# Patient Record
Sex: Male | Born: 1970 | Race: White | Hispanic: No | Marital: Married | Smoking: Former smoker
Health system: Southern US, Community
[De-identification: ages and names within clinical notes are randomized; demographics above are authoritative.]

## PROBLEM LIST (undated history)

## (undated) DIAGNOSIS — M795 Residual foreign body in soft tissue: Secondary | ICD-10-CM

## (undated) DIAGNOSIS — T7840XA Allergy, unspecified, initial encounter: Secondary | ICD-10-CM

## (undated) DIAGNOSIS — Z8619 Personal history of other infectious and parasitic diseases: Secondary | ICD-10-CM

## (undated) DIAGNOSIS — G4733 Obstructive sleep apnea (adult) (pediatric): Principal | ICD-10-CM

## (undated) DIAGNOSIS — I10 Essential (primary) hypertension: Secondary | ICD-10-CM

## (undated) DIAGNOSIS — E785 Hyperlipidemia, unspecified: Secondary | ICD-10-CM

## (undated) DIAGNOSIS — K219 Gastro-esophageal reflux disease without esophagitis: Secondary | ICD-10-CM

## (undated) HISTORY — DX: Essential (primary) hypertension: I10

## (undated) HISTORY — DX: Personal history of other infectious and parasitic diseases: Z86.19

## (undated) HISTORY — DX: Allergy, unspecified, initial encounter: T78.40XA

## (undated) HISTORY — DX: Obstructive sleep apnea (adult) (pediatric): G47.33

## (undated) HISTORY — PX: CERVICAL FUSION: SHX112

## (undated) HISTORY — DX: Hyperlipidemia, unspecified: E78.5

---

## 2007-07-16 HISTORY — PX: IRRIGATION AND DEBRIDEMENT SEBACEOUS CYST: SHX5255

## 2011-07-04 ENCOUNTER — Ambulatory Visit: Payer: Self-pay

## 2012-01-24 ENCOUNTER — Ambulatory Visit (INDEPENDENT_AMBULATORY_CARE_PROVIDER_SITE_OTHER): Payer: 59 | Admitting: Internal Medicine

## 2012-01-24 ENCOUNTER — Encounter: Payer: Self-pay | Admitting: Internal Medicine

## 2012-01-24 VITALS — BP 153/93 | HR 73 | Temp 98.1°F | Resp 16 | Ht 70.0 in | Wt 305.0 lb

## 2012-01-24 DIAGNOSIS — I10 Essential (primary) hypertension: Secondary | ICD-10-CM

## 2012-01-24 DIAGNOSIS — R0609 Other forms of dyspnea: Secondary | ICD-10-CM

## 2012-01-24 DIAGNOSIS — F17200 Nicotine dependence, unspecified, uncomplicated: Secondary | ICD-10-CM

## 2012-01-24 DIAGNOSIS — E669 Obesity, unspecified: Secondary | ICD-10-CM

## 2012-01-24 DIAGNOSIS — Z720 Tobacco use: Secondary | ICD-10-CM

## 2012-01-24 DIAGNOSIS — R0683 Snoring: Secondary | ICD-10-CM

## 2012-01-24 MED ORDER — ATENOLOL 50 MG PO TABS: 50.0000 mg | ORAL_TABLET | Freq: Every day | ORAL | Status: DC

## 2012-01-24 NOTE — Patient Instructions (Signed)
Please schedule fasting labs for next Weds Cbc, TSH-401.9, chem7-v58.69 and lipid-chol screening

## 2012-01-26 ENCOUNTER — Encounter: Payer: Self-pay | Admitting: Internal Medicine

## 2012-01-26 DIAGNOSIS — IMO0002 Reserved for concepts with insufficient information to code with codable children: Secondary | ICD-10-CM | POA: Insufficient documentation

## 2012-01-26 DIAGNOSIS — Z720 Tobacco use: Secondary | ICD-10-CM | POA: Insufficient documentation

## 2012-01-26 DIAGNOSIS — I10 Essential (primary) hypertension: Secondary | ICD-10-CM | POA: Insufficient documentation

## 2012-01-26 DIAGNOSIS — E669 Obesity, unspecified: Secondary | ICD-10-CM | POA: Insufficient documentation

## 2012-01-26 DIAGNOSIS — G4733 Obstructive sleep apnea (adult) (pediatric): Secondary | ICD-10-CM | POA: Insufficient documentation

## 2012-01-26 HISTORY — DX: Obstructive sleep apnea (adult) (pediatric): G47.33

## 2012-01-26 NOTE — Assessment & Plan Note (Signed)
Discussed and recommended cessation. States understanding.

## 2012-01-26 NOTE — Assessment & Plan Note (Signed)
Encouraged dietary modification and wt loss. Obtain chem7, tsh and lipid

## 2012-01-26 NOTE — Assessment & Plan Note (Signed)
Suspect possible osa. Discussed sleep study and pt states will consider. Recommend wt loss

## 2012-01-26 NOTE — Assessment & Plan Note (Signed)
Isolated elevation off medication. Resume atenolol. Monitor bp as outpt and f/u in clinic as scheduled. Obtain cbc

## 2012-01-26 NOTE — Progress Notes (Signed)
  Subjective:    Patient ID: Micheal Mckinney, male    DOB: 1971-04-16, 41 y.o.   MRN: [id number]  HPI Pt presents to clinic for follow up of HTN. On stable dose of beta blocker without side effects. BP typically normotensive but has been out of medication for three days. Is smoking 1/2 ppd down from previous. Has been successful at cessation in the past. Does snore and his wife may believe he has witnessed apnea. No known h/o osa and no past sleep study. No other complaints.  Past Medical History  Diagnosis Date  . History of chicken pox   . Allergy     hay fever  . Hypertension    Past Surgical History  Procedure Date  . Irrigation and debridement sebaceous cyst 2009     on back and neck    reports that he has been smoking.  He does not have any smokeless tobacco history on file. He reports that he drinks alcohol. His drug history not on file. family history includes Cancer in his mother. No Known Allergies   Review of Systems  Constitutional: Negative for fatigue.  Respiratory: Positive for apnea. Negative for shortness of breath.   Cardiovascular: Negative for chest pain.  Neurological: Negative for dizziness.  All other systems reviewed and are negative.       Objective:   Physical Exam  Nursing note and vitals reviewed. Constitutional: He appears well-developed and well-nourished. No distress.  HENT:  Head: Normocephalic and atraumatic.  Right Ear: External ear normal.  Left Ear: External ear normal.  Nose: Nose normal.  Mouth/Throat: Oropharynx is clear and moist. No oropharyngeal exudate.  Eyes: Conjunctivae and EOM are normal. Pupils are equal, round, and reactive to light. No scleral icterus.  Neck: Neck supple. No thyromegaly present.  Cardiovascular: Normal rate, regular rhythm and normal heart sounds.  Exam reveals no gallop and no friction rub.   No murmur heard. Pulmonary/Chest: Effort normal and breath sounds normal. No respiratory distress. He has no  wheezes. He has no rales.  Lymphadenopathy:    He has no cervical adenopathy.  Neurological: He is alert.  Skin: Skin is warm and dry. No rash noted. He is not diaphoretic.  Psychiatric: He has a normal mood and affect. His behavior is normal.          Assessment & Plan:

## 2012-07-31 ENCOUNTER — Ambulatory Visit: Payer: 59 | Admitting: Internal Medicine

## 2013-01-12 ENCOUNTER — Encounter: Payer: Self-pay | Admitting: Family

## 2013-01-12 ENCOUNTER — Ambulatory Visit (INDEPENDENT_AMBULATORY_CARE_PROVIDER_SITE_OTHER): Payer: 59 | Admitting: Family

## 2013-01-12 VITALS — BP 130/90 | HR 70 | Temp 97.9°F | Resp 16 | Ht 70.0 in | Wt 326.1 lb

## 2013-01-12 DIAGNOSIS — Z720 Tobacco use: Secondary | ICD-10-CM

## 2013-01-12 DIAGNOSIS — G5600 Carpal tunnel syndrome, unspecified upper limb: Secondary | ICD-10-CM

## 2013-01-12 DIAGNOSIS — R400 Somnolence: Secondary | ICD-10-CM

## 2013-01-12 DIAGNOSIS — R0683 Snoring: Secondary | ICD-10-CM

## 2013-01-12 DIAGNOSIS — I10 Essential (primary) hypertension: Secondary | ICD-10-CM

## 2013-01-12 DIAGNOSIS — R404 Transient alteration of awareness: Secondary | ICD-10-CM

## 2013-01-12 DIAGNOSIS — F17200 Nicotine dependence, unspecified, uncomplicated: Secondary | ICD-10-CM

## 2013-01-12 DIAGNOSIS — R0609 Other forms of dyspnea: Secondary | ICD-10-CM

## 2013-01-12 LAB — BASIC METABOLIC PANEL
Calcium: 10.2 mg/dL (ref 8.4–10.5)
Potassium: 4.5 mEq/L (ref 3.5–5.3)
Sodium: 139 mEq/L (ref 135–145)

## 2013-01-12 MED ORDER — ATENOLOL 50 MG PO TABS: 50.0000 mg | ORAL_TABLET | Freq: Every day | ORAL | Status: DC

## 2013-01-12 MED ORDER — MELOXICAM 7.5 MG PO TABS: 7.5000 mg | ORAL_TABLET | Freq: Every day | ORAL | Status: DC

## 2013-01-12 NOTE — Assessment & Plan Note (Signed)
Suspect OSA.  Will refer for split night study.  Pt is agreeable.

## 2013-01-12 NOTE — Progress Notes (Signed)
  Subjective:    Patient ID: Micheal Mckinney, male    DOB: 1971/02/15, 42 y.o.   MRN: [id number]  HPI  [name] is a 42 year old male who presents today for follow up.  1) HTN- denies CP/SOB  2) Hand pain- pt works as a Nutritional therapist.  Reports tingling, numbness, pain.  He has tried icing/heat, wears wrist splints at night which helps.   3) Snoring-  Continues to be a problem, reports daytime somnolence.    4) Tobacco abuse- quit 11 months ago after switching to E- cig.   Review of Systems See HPI  Past Medical History  Diagnosis Date  . History of chicken pox   . Allergy     hay fever  . Hypertension     History   Social History  . Marital Status: Divorced    Spouse Name: N/A    Number of Children: N/A  . Years of Education: N/A   Occupational History  . Not on file.   Social History Main Topics  . Smoking status: Former Smoker    Quit date: 02/13/2012  . Smokeless tobacco: Not on file  . Alcohol Use: Yes  . Drug Use: Not on file  . Sexually Active: Yes   Other Topics Concern  . Not on file   Social History Narrative  . No narrative on file    Past Surgical History  Procedure Laterality Date  . Irrigation and debridement sebaceous cyst  2009     on back and neck    Family History  Problem Relation Age of Onset  . Cancer Mother     breast    No Known Allergies  Current Outpatient Prescriptions on File Prior to Visit  Medication Sig Dispense Refill  . aspirin 81 MG tablet Take 81 mg by mouth daily.       No current facility-administered medications on file prior to visit.    BP 130/90  Pulse 70  Temp(Src) 97.9 F (36.6 C) (Oral)  Resp 16  Ht 5\' 10"  (1.778 m)  Wt 326 lb 1.9 oz (147.927 kg)  BMI 46.79 kg/m2  SpO2 98%       Objective:   Physical Exam  Constitutional: He is oriented to person, place, and time. He appears well-developed and well-nourished. No distress.  Cardiovascular: Normal rate and regular rhythm.   No murmur  heard. Pulmonary/Chest: Effort normal and breath sounds normal. No respiratory distress. He has no wheezes. He has no rales. He exhibits no tenderness.  Musculoskeletal: He exhibits no edema.  Neurological: He is alert and oriented to person, place, and time.  Psychiatric: He has a normal mood and affect. His behavior is normal. Judgment and thought content normal.          Assessment & Plan:

## 2013-01-12 NOTE — Assessment & Plan Note (Signed)
I commended him for quitting!

## 2013-01-12 NOTE — Patient Instructions (Addendum)
Please complete your lab work prior to leaving. Congratulations on quitting smoking! You will be contacted about your referral to the hand specialist and about your sleep study. Follow up in 3 months for a fasting physical.

## 2013-01-12 NOTE — Assessment & Plan Note (Signed)
BP Readings from Last 3 Encounters:  01/12/13 130/90  01/24/12 153/93   BP looks good today.  Continue atenolol. Obtain bmet.

## 2013-01-12 NOTE — Assessment & Plan Note (Signed)
Symptoms most consistent with CTS.  Has been working on conservative measures at home with little improvement.  Add short course of meloxicam and will refer to Dr. Amanda Pea for further evaluation.

## 2013-02-07 ENCOUNTER — Encounter (HOSPITAL_BASED_OUTPATIENT_CLINIC_OR_DEPARTMENT_OTHER): Payer: 59

## 2013-03-05 ENCOUNTER — Ambulatory Visit (HOSPITAL_BASED_OUTPATIENT_CLINIC_OR_DEPARTMENT_OTHER): Payer: 59 | Attending: Family | Admitting: Radiology

## 2013-03-05 VITALS — Ht 70.0 in | Wt 300.0 lb

## 2013-03-05 DIAGNOSIS — G4733 Obstructive sleep apnea (adult) (pediatric): Secondary | ICD-10-CM | POA: Insufficient documentation

## 2013-03-05 DIAGNOSIS — R400 Somnolence: Secondary | ICD-10-CM

## 2013-03-17 ENCOUNTER — Encounter: Payer: Self-pay | Admitting: Family

## 2013-03-17 ENCOUNTER — Telehealth: Payer: Self-pay | Admitting: Family

## 2013-03-17 DIAGNOSIS — G4710 Hypersomnia, unspecified: Secondary | ICD-10-CM

## 2013-03-17 DIAGNOSIS — G4730 Sleep apnea, unspecified: Secondary | ICD-10-CM

## 2013-03-17 DIAGNOSIS — G4733 Obstructive sleep apnea (adult) (pediatric): Secondary | ICD-10-CM

## 2013-03-17 NOTE — Telephone Encounter (Signed)
Please call pt and let him know that his sleep study showed severe sleep apnea.  I would like to schedule him for a CPAP titration.

## 2013-03-17 NOTE — Procedures (Signed)
Micheal Mckinney, OMALLEY NO.:  [id number]  MEDICAL RECORD NO.:  [id number]          PATIENT TYPE:  OUT  LOCATION:  SLEEP CENTER                 [facility]ACILITY[facility]  PHYSICIAN:  Barbaraann Share, MD,FCCPDATE OF BIRTH:  1971-04-[birth year]  DATE OF STUDY:  03/05/2013                           NOCTURNAL POLYSOMNOGRAM  REFERRING PHYSICIAN:  Sandford Craze, NP  INDICATION FOR STUDY:  Hypersomnia with sleep apnea.  EPWORTH SLEEPINESS SCORE:  16.  MEDICATIONS:  SLEEP ARCHITECTURE:  The patient had a total sleep time of 348 minutes with no slow-wave sleep and decreased quantity of REM.  Sleep onset latency was normal at 17 minutes and REM onset was normal at 94 minutes. Sleep efficiency was 69% during the diagnostic portion of the study, and 90% during the titration portion.  RESPIRATORY DATA:  The patient underwent a split-night protocol, where he was found to have 238 obstructive and central events in the first 125 minutes of sleep.  This gave him an apnea-hypopnea index of 114 events per hour during the diagnostic portion of the study.  The events occurred in all body positions, and there was loud snoring noted throughout.  By protocol, the patient was then fitted with a standard ResMed AirFit N10 nasal CPAP mask, and CPAP titration was started.  He was titrated as high as 14 cm of water pressure, but continued to have some breakthrough events.  Unfortunately, there was not adequate time for further titration.  OXYGEN DATA:  There was O2 desaturation as low as 74% with the patient's obstructive events.  CARDIAC DATA:  Occasional PAC noted, but no clinically significant arrhythmias were seen.  MOVEMENT-PARASOMNIA:  The patient had no significant leg jerks or other abnormal behaviors noted.  IMPRESSIONS-RECOMMENDATIONS: 1. Split-night study reveals very severe obstructive sleep     apnea/hypopnea syndrome, with an AHI of 114 events per hour and     oxygen desaturation  as low as 74% during the diagnostic portion of     the study.  The patient was then fitted with a standard ResMed     AirFit N10 nasal CPAP mask, however, there was not adequate time to     achieve optimal pressure.  This will need to be done with an     automatic CPAP device at home, or the patient     can return for a formal CPAP titration.  He should also be     encouraged to work aggressively on weight loss. 2. Occasional PAC noted, but no clinically significant arrhythmias     were seen.     Barbaraann Share, MD,FCCP Diplomate, American Board of Sleep Medicine    [facility]/MEDQ  D:  03/17/2013 08:31:36  T:  03/17/2013 10:35:31  Job:  [id number]

## 2013-03-18 NOTE — Telephone Encounter (Signed)
Left detailed message on pt's cell# and to call and let us know if he wants to proceed.

## 2013-03-23 NOTE — Telephone Encounter (Signed)
Discussed results with pt and he is agreeable to proceed with CPAP titration.

## 2013-05-04 HISTORY — PX: CARPAL TUNNEL RELEASE: SHX101

## 2013-05-07 ENCOUNTER — Encounter: Payer: Self-pay | Admitting: Family

## 2013-05-07 ENCOUNTER — Ambulatory Visit (INDEPENDENT_AMBULATORY_CARE_PROVIDER_SITE_OTHER): Payer: 59 | Admitting: Family

## 2013-05-07 ENCOUNTER — Ambulatory Visit (HOSPITAL_BASED_OUTPATIENT_CLINIC_OR_DEPARTMENT_OTHER): Payer: 59 | Attending: Family

## 2013-05-07 VITALS — BP 126/80 | HR 66 | Temp 98.6°F | Resp 16 | Ht 70.0 in | Wt 326.1 lb

## 2013-05-07 DIAGNOSIS — G4733 Obstructive sleep apnea (adult) (pediatric): Secondary | ICD-10-CM | POA: Insufficient documentation

## 2013-05-07 DIAGNOSIS — Z23 Encounter for immunization: Secondary | ICD-10-CM

## 2013-05-07 DIAGNOSIS — Z0000 Encounter for general adult medical examination without abnormal findings: Secondary | ICD-10-CM

## 2013-05-07 LAB — CBC WITH DIFFERENTIAL/PLATELET
Basophils Absolute: 0 10*3/uL (ref 0.0–0.1)
Basophils Relative: 0 % (ref 0–1)
HCT: 43.5 % (ref 39.0–52.0)
Hemoglobin: 15.3 g/dL (ref 13.0–17.0)
Lymphs Abs: 3.4 10*3/uL (ref 0.7–4.0)
MCH: 30 pg (ref 26.0–34.0)
MCHC: 35.2 g/dL (ref 30.0–36.0)
Monocytes Absolute: 0.6 10*3/uL (ref 0.1–1.0)
Neutro Abs: 4 10*3/uL (ref 1.7–7.7)
RDW: 13.9 % (ref 11.5–15.5)

## 2013-05-07 LAB — BASIC METABOLIC PANEL WITH GFR
Calcium: 10.1 mg/dL (ref 8.4–10.5)
Chloride: 104 mEq/L (ref 96–112)
GFR, Est African American: >89 mL/min
GFR, Est Non African American: >89 mL/min
Potassium: 4.3 mEq/L (ref 3.5–5.3)

## 2013-05-07 LAB — HEPATIC FUNCTION PANEL
Alkaline Phosphatase: 61 U/L (ref 39–117)
Bilirubin, Direct: 0.1 mg/dL (ref 0.0–0.3)
Indirect Bilirubin: 0.4 mg/dL (ref 0.0–0.9)
Total Bilirubin: 0.5 mg/dL (ref 0.3–1.2)
Total Protein: 6.6 g/dL (ref 6.0–8.3)

## 2013-05-07 LAB — TSH: TSH: 2.275 u[IU]/mL (ref 0.350–4.500)

## 2013-05-07 LAB — LIPID PANEL
Cholesterol: 227 mg/dL — ABNORMAL HIGH (ref 0–200)
HDL: 29 mg/dL — ABNORMAL LOW (ref >39)
LDL Cholesterol: 156 mg/dL — ABNORMAL HIGH (ref 0–99)
Total CHOL/HDL Ratio: 7.8 Ratio
Triglycerides: 211 mg/dL — ABNORMAL HIGH (ref <150)

## 2013-05-07 NOTE — Assessment & Plan Note (Signed)
Discussed importance of healthy diet, exercise, weight loss.  Plan follow up in 6 months.  Goal weight loss 1 pound a week.  Obtain fasting lab work.   EKG today.

## 2013-05-07 NOTE — Progress Notes (Signed)
Subjective:    Patient ID: Micheal Mckinney, male    DOB: 09/30/1970, 42 y.o.   MRN: [id number]  HPI  Patient presents today for complete physical.  Immunizations: tetanus up to date. Flu shot today.   Diet: reports his diet is terrible.  Reports weight problems his who life.  Reports at one time he was down to 200 pounds.  Eats too many carbs.   Exercise: plans to start exercise regimen.    Reports carpal tunnel surgery Dr.  Brown Human 2 days ago.  Reports some pain post op.   Got married in [place] 2 weeks ago.      Review of Systems  Constitutional: Negative for unexpected weight change.  Respiratory: Negative for cough and shortness of breath.        Mild nasal congestion  Cardiovascular: Negative for chest pain.  Gastrointestinal: Negative for vomiting, constipation and blood in stool.  Genitourinary: Negative for dysuria, frequency and hematuria.  Musculoskeletal: Negative for arthralgias and myalgias.  Skin: Negative for rash.  Neurological:       Reports no recent HA's.   Hematological: Negative for adenopathy.  Psychiatric/Behavioral:       Denies depression/anxiety   Past Medical History  Diagnosis Date  . History of chicken pox   . Allergy     hay fever  . Hypertension   . OSA (obstructive sleep apnea) 01/26/2012    History   Social History  . Marital Status: Divorced    Spouse Name: N/A    Number of Children: N/A  . Years of Education: N/A   Occupational History  . Not on file.   Social History Main Topics  . Smoking status: Former Smoker    Quit date: 02/13/2012  . Smokeless tobacco: Not on file  . Alcohol Use: Yes  . Drug Use: Not on file  . Sexual Activity: Yes   Other Topics Concern  . Not on file   Social History Narrative  . No narrative on file    Past Surgical History  Procedure Laterality Date  . Irrigation and debridement sebaceous cyst  2009     on back and neck  . Carpal tunnel release Right 05/04/13    Family History   Problem Relation Age of Onset  . Cancer Mother     breast    No Known Allergies  Current Outpatient Prescriptions on File Prior to Visit  Medication Sig Dispense Refill  . aspirin 81 MG tablet Take 81 mg by mouth daily.      Marland Kitchen atenolol (TENORMIN) 50 MG tablet Take 1 tablet (50 mg total) by mouth daily.  90 tablet  1  . meloxicam (MOBIC) 7.5 MG tablet Take 1 tablet (7.5 mg total) by mouth daily.  14 tablet  0   No current facility-administered medications on file prior to visit.    BP 126/80  Pulse 66  Temp(Src) 98.6 F (37 C) (Oral)  Resp 16  Ht 5\' 10"  (1.778 m)  Wt 326 lb 1.3 oz (147.909 kg)  BMI 46.79 kg/m2  SpO2 99%        Objective:   Physical Exam  Physical Exam  Constitutional:morbidly obese male. He is oriented to person, place, and time. He appears well-developed and well-nourished. No distress.  HENT:  Head: Normocephalic and atraumatic.  Right Ear: Tympanic membrane and ear canal normal.  Left Ear: Tympanic membrane and ear canal normal.  Mouth/Throat: Oropharynx is clear and moist.  Eyes: Pupils are equal, round, and reactive  to light. No scleral icterus.  Neck: Normal range of motion. No thyromegaly present.  Cardiovascular: Normal rate and regular rhythm.   No murmur heard. Pulmonary/Chest: Effort normal and breath sounds normal. No respiratory distress. He has no wheezes. He has no rales. He exhibits no tenderness.  Abdominal: Soft. Bowel sounds are normal. He exhibits no distension and no mass. There is no tenderness. There is no rebound and no guarding.  Musculoskeletal: He exhibits no edema.  Lymphadenopathy:    He has no cervical adenopathy.  Neurological: He is alert and oriented to person, place, and time. He has normal reflexes. He exhibits normal muscle tone. Coordination normal.  Skin: Skin is warm and dry.  Psychiatric: He has a normal mood and affect. His behavior is normal. Judgment and thought content normal.          Assessment &  Plan:         Assessment & Plan:

## 2013-05-07 NOTE — Patient Instructions (Signed)
Please complete lab work prior to leaving. Work hard on [employer], exercise, and weight loss. Follow up in 6 months.

## 2013-05-08 LAB — URINALYSIS, MICROSCOPIC ONLY
Bacteria, UA: NONE SEEN
Crystals: NONE SEEN
Squamous Epithelial / LPF: NONE SEEN

## 2013-05-08 LAB — URINALYSIS, ROUTINE W REFLEX MICROSCOPIC
Bilirubin Urine: NEGATIVE
Ketones, ur: NEGATIVE mg/dL
Leukocytes, UA: NEGATIVE
Nitrite: NEGATIVE
Protein, ur: NEGATIVE mg/dL
Urobilinogen, UA: 0.2 mg/dL (ref 0.0–1.0)

## 2013-05-10 ENCOUNTER — Encounter: Payer: Self-pay | Admitting: Family

## 2013-05-10 DIAGNOSIS — E785 Hyperlipidemia, unspecified: Secondary | ICD-10-CM | POA: Insufficient documentation

## 2013-05-24 DIAGNOSIS — G4730 Sleep apnea, unspecified: Secondary | ICD-10-CM

## 2013-05-24 DIAGNOSIS — G4710 Hypersomnia, unspecified: Secondary | ICD-10-CM

## 2013-05-24 DIAGNOSIS — G4733 Obstructive sleep apnea (adult) (pediatric): Secondary | ICD-10-CM

## 2013-05-25 NOTE — Procedures (Signed)
Micheal Mckinney, SENSING NO.:  [id number]  MEDICAL RECORD NO.:  [id number]          PATIENT TYPE:  OUT  LOCATION:  SLEEP CENTER                 FACILITY:  [facility]  PHYSICIAN:  Oretha Milch, MD      DATE OF BIRTH:  11-10-1970  DATE OF STUDY:  05/07/2013                           NOCTURNAL POLYSOMNOGRAM  REFERRING PHYSICIAN:  Sandford Craze, NP  INDICATION FOR STUDY:  Micheal Mckinney is a 42 year old obese man with severe obstructive sleep apnea.  His split night study on March 05, 2013, showed severe obstructive sleep apnea with AHI of 114 events per hour with nadir oxygen desaturation of  74%.  This was partially corrected by CPAP of 14 cm, but some breakthrough events were noted. Hence, a repeat CPAP titration study was ordered.  This CPAP titration study was performed with a sleep technologist in attendance.  EEG, EOG, EMG, EKG, and respiratory parameters were recorded.  Sleep stages, arousals, limb movements, respiratory data were scored according to criteria laid out by the American Academy of Sleep Medicine.  At the time of this study, he weighed 326 pounds with a height of 5 feet 10 inches, BMI of 47, neck size of 17.5 inches.  EPWORTH SLEEPINESS SCORE:  16.  SLEEP ARCHITECTURE:  Lights out was at 11:01 p.m., lights on was at 5:06 a.m.  Total sleep time was 291 minutes with a sleep period time of 349 minutes and a sleep efficiency of 80%.  Sleep latency was 16 minutes, sleep latency to REM sleep was 98 minutes, and wake after sleep onset was 57 minutes.  Sleep stages as a percentage of total sleep time was N1 2.7%, N2 73%, N3 0.2%, and REM sleep 69 minutes (24%).  Supine REM sleep accounted for 69 minutes.  REM sleep was noted in 2 stages around 1:00 a.m. and 4:00 a.m.  AROUSAL DATA:  There were 44 arousals with an arousal index of 90 events per hour.  Of these, 16 were spontaneous and the rest were associated with respiratory events.  RESPIRATORY  DATA:  CPAP was initiated at 5 cm and titrated to a level of 14 cm.  At this level for 14 minutes including 0.5 minutes of REM sleep, 1 hypopnea was noted with a lowest desaturation of 92%.  Due to need for higher pressures and some breakthrough events, he was placed on BiPAP at 15/11, which was again titrated to 17/13 at this final BiPAP pressure for 66 minutes including 34 minutes of REM sleep.  No events or desaturations were noted.  This appears to be the optimal level used during the study.  LIMB MOVEMENT DATA:  The PLM index was 2.7 events per hour.  The PLM arousal index was only 0.4 events per hour.  OXYGEN DATA:  Desaturation index was 28 events per hour with a lowest desaturation of 88%.  He spent 0.1 minutes with a saturation less than 88%.  CARDIAC DATA:  The low heart rate was 37 beats per minute.  The high heart rate recorded was an artifact.  No arrhythmias were noted.  DISCUSSION:  BiPAP was used due to need for higher pressures.  Some leak was noted with a large full-face  mask due to his full beard.  IMPRESSION: 1. Severe obstructive sleep apnea with hypopneas causing sleep     fragmentation and oxygen desaturation. 2. This was corrected by CPAP of 14 cm; however, some breakthrough     events persisted hence BiPAP of 17/13 was optimally required to     correct sleep disorder breathing.  Few PLMS were noted.  The     significance of this is unclear. 3. No evidence of cardiac arrhythmias or behavioral disturbance during     sleep.  RECOMMENDATION:   1. CPAP should be initiated at 14 cm with a large full- face mask and compliance monitored at this level.   2.If he has breakthrough symptoms or snoring at this level, then BiPAP can be initiated at 17/13 cm.  This appears to be the optimal pressure for BiPAP. 3. He should be cautioned against driving when sleepy.  He should be asked to avoid medications with sedative side effects.     Oretha Milch,  MD    RVA/MEDQ  D:  05/24/2013 17:55:16  T:  05/25/2013 18:27:12  Job:  [id number]

## 2013-05-26 ENCOUNTER — Telehealth: Payer: Self-pay | Admitting: Family

## 2013-05-26 DIAGNOSIS — G4733 Obstructive sleep apnea (adult) (pediatric): Secondary | ICD-10-CM

## 2013-05-26 NOTE — Telephone Encounter (Signed)
Please call pt and let him know that I am placing order for home CPAP.

## 2013-05-28 NOTE — Telephone Encounter (Signed)
Notified pt and he voices understanding. 

## 2013-07-19 ENCOUNTER — Telehealth: Payer: Self-pay | Admitting: Family

## 2013-07-19 DIAGNOSIS — G4733 Obstructive sleep apnea (adult) (pediatric): Secondary | ICD-10-CM

## 2013-07-19 NOTE — Telephone Encounter (Signed)
PATIENT NEEDS A FORM FILLED OUT FOR HIS LIFE INSURANCE.  HIS WIFE JOY WOULD LIKE TO E MAIL THE FORM  ALSO HE HAD A SLEEP STUDY AND NEEDS A CPAP MACHINE.  HAVE NOT HEARD ANYTHING FROM ANYONE ABOUT THE STUDY OR THE MACHINE

## 2013-07-19 NOTE — Telephone Encounter (Signed)
Victorino DikeJennifer, can you please check the status of this referral from November?

## 2013-07-20 NOTE — Telephone Encounter (Signed)
Received call from pt's wife stating pt has had great difficulty adjusting to his CPAP machine and is currently not using it. She also requests email to send us the insurance form. Advised her we do not have email for that but she could fax form or drop it off at the office. She states she does not have access to a fax machine and will bring form by the office.  Spoke with pt.  He states he tried to use it for about 1 week.  Initially started out with nasal mask for about 2 hours a night. Would wake up gasping for air feeling like he could not breathe out.  He tried the full face mask after this and states he continued to wake up gasping for air and could only tolerate the mask for about 1 hour a night before he stopped using it.  Pt wants to know what his next steps are? States the tech that did his sleep study mentioned that he might be able to tolerate a bipap machine better.  Pt's wife voiced frustration that they were told at the sleep center that the pulmonologist would call pt at frequent intervals to assess his progress and they have not heard from anyone.  Apologized to pt's wife and advised her that pt is always welcome and encouraged to call us for any concerns. Please advise pt's next steps?

## 2013-07-20 NOTE — Telephone Encounter (Signed)
Sent Referral back to Al PimpleMelissa Stentson with [facility], awaiting to here from her.

## 2013-07-20 NOTE — Telephone Encounter (Signed)
Will arrange referral to pulmonary.

## 2013-07-21 NOTE — Telephone Encounter (Signed)
Left detailed message on cell # and to call if any questions or if he is not contacted about his referral within 1 week.

## 2013-07-28 ENCOUNTER — Telehealth: Payer: Self-pay | Admitting: *Deleted

## 2013-07-28 NOTE — Telephone Encounter (Signed)
Life Insurance form completed for [employer].  They are also requesting pt's medical records for the last 5 years.  Verified with pt's wife that they are aware and agreeable to release pt's records with form. We have only been seeing pt since 2013 and will fax records to [phone].   Judeth CornfieldStephanie, can you print/fax records and form to above number. I have placed form on your desk and sent a copy to be scanned.

## 2013-07-28 NOTE — Telephone Encounter (Signed)
Records faxed.

## 2013-08-09 ENCOUNTER — Telehealth: Payer: Self-pay | Admitting: *Deleted

## 2013-08-09 NOTE — Telephone Encounter (Signed)
Pt is already sch to see Dr Vassie LollAlva 2/17, called pt no answer left message to return call

## 2013-08-09 NOTE — Telephone Encounter (Signed)
[facility] stating pt contacted them about trying bipap as he is not doing well with CPAP machine, feels like he is suffocating.  Advised her per 07/28/13 phone note that we have referred pt to pulmonology for further assessment / recommendations. Our referral co-ordinator will arrange appt and contact pt.

## 2013-08-10 ENCOUNTER — Encounter: Payer: Self-pay | Admitting: Family

## 2013-08-31 ENCOUNTER — Institutional Professional Consult (permissible substitution): Payer: 59 | Admitting: Pulmonary Disease

## 2013-09-21 ENCOUNTER — Encounter (INDEPENDENT_AMBULATORY_CARE_PROVIDER_SITE_OTHER): Payer: Self-pay

## 2013-09-21 ENCOUNTER — Encounter: Payer: Self-pay | Admitting: Pulmonary Disease

## 2013-09-21 ENCOUNTER — Ambulatory Visit (INDEPENDENT_AMBULATORY_CARE_PROVIDER_SITE_OTHER): Payer: 59 | Admitting: Pulmonary Disease

## 2013-09-21 VITALS — BP 124/76 | HR 86 | Temp 98.5°F | Ht 70.0 in

## 2013-09-21 DIAGNOSIS — G4733 Obstructive sleep apnea (adult) (pediatric): Secondary | ICD-10-CM

## 2013-09-21 NOTE — Assessment & Plan Note (Addendum)
The pathophysiology of obstructive sleep apnea , it's cardiovascular consequences & modes of treatment including CPAP were discused with the patient in detail & they evidenced understanding. His main difficulty in tolerating CPAP seems to be exhaling against the high pressure, this seems to be persistent in spite of EPR level of 3. It would be best to keep him on BiPAP- we will start with an auto BiPAP so that his mean pressure is low until he gets comfortable his machine  Weight loss encouraged, compliance with goal of at least 4-6 hrs every night is the expectation. Advised against medications with sedative side effects Cautioned against driving when sleepy - understanding that sleepiness will vary on a day to day basis

## 2013-09-21 NOTE — Patient Instructions (Signed)
We will put you on a bipap machine Call us with issues

## 2013-09-21 NOTE — Progress Notes (Signed)
Subjective:    Patient ID: Micheal Mckinney, male    DOB: 04/16/1971, 43 y.o.   MRN: [id number]  HPI  43 year old obese hypertensive presents for evulsion of obstructive sleep apnea. 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 Epworth sleepiness score is 13/24 and he reports excessive somnolence and very suppression such as sitting and reading, watching TV or in the afternoons. Bedtime is around 10:30 PM, sleep latency is around 15 minutes, he sleeps on his side with one pillow due to an old neck injury, reports 5-10 awakenings including a bathroom visit once a week without any post void sleep latency and is out of bed at 6 AM feeling tired with dryness of mouth. He is gained about her tonsil the last 4 years. He drinks 2 cups of coffee in the morning .  Baseline polysomnogram in 02/2013 showed severe obstructive sleep apnea with AHI 114 events per hour and lowest desaturation of 74%. This was partially corrected by CPAP of 14 cm but breakthrough events were noted. On a repeat CPAP titration study in 04/2013, BiPAP was titrated to 17/13 cm polyp in 8 respiratory events and snoring.  He was started on CPAP of 14 cm by his PCP but he reports inability to tolerate due to difficulty exhaling. He has tried a nasal mask as well as a full face mask.  There is no history suggestive of cataplexy, sleep paralysis or parasomnias   Past Medical History  Diagnosis Date  . History of chicken pox   . Allergy     hay fever  . Hypertension   . OSA (obstructive sleep apnea) 01/26/2012    Past Surgical History  Procedure Laterality Date  . Irrigation and debridement sebaceous cyst  2009     on back and neck  . Carpal tunnel release Right 05/04/13    No Known Allergies  History   Social History  . Marital Status: Married    Spouse Name: N/A    Number of Children: N/A  . Years of Education: N/A   Occupational History  . Not on file.   Social History Main Topics  . Smoking status: Former Smoker --  1.00 packs/day for 25 years    Types: Cigarettes    Quit date: 02/13/2012  . Smokeless tobacco: Not on file  . Alcohol Use: Yes  . Drug Use: Not on file  . Sexual Activity: Yes   Other Topics Concern  . Not on file   Social History Narrative   married    Family History  Problem Relation Age of Onset  . Cancer Mother     breast     Review of Systems  Constitutional: Negative for fever and unexpected weight change.  HENT: Negative for congestion, dental problem, ear pain, nosebleeds, postnasal drip, rhinorrhea, sinus pressure, sneezing, sore throat and trouble swallowing.   Eyes: Negative for redness and itching.  Respiratory: Negative for cough, chest tightness, shortness of breath and wheezing.   Cardiovascular: Negative for palpitations and leg swelling.  Gastrointestinal: Negative for nausea and vomiting.  Genitourinary: Negative for dysuria.  Musculoskeletal: Negative for joint swelling.  Skin: Negative for rash.  Neurological: Negative for headaches.  Hematological: Does not bruise/bleed easily.  Psychiatric/Behavioral: Negative for dysphoric mood. The patient is not nervous/anxious.        Objective:   Physical Exam  Gen. Pleasant, obese, in no distress, normal affect ENT - no lesions, no post nasal drip, class 2-3 airway Neck: No JVD, no thyromegaly, no  carotid bruits Lungs: no use of accessory muscles, no dullness to percussion, decreased without rales or rhonchi  Cardiovascular: Rhythm regular, heart sounds  normal, no murmurs or gallops, no peripheral edema Abdomen: soft and non-tender, no hepatosplenomegaly, BS normal. Musculoskeletal: No deformities, no cyanosis or clubbing Neuro:  alert, non focal, no tremors       Assessment & Plan:

## 2013-09-29 ENCOUNTER — Other Ambulatory Visit: Payer: Self-pay | Admitting: Family

## 2013-10-20 ENCOUNTER — Ambulatory Visit: Payer: 59 | Admitting: Pulmonary Disease

## 2013-11-05 ENCOUNTER — Ambulatory Visit: Payer: 59 | Admitting: Family

## 2013-11-24 ENCOUNTER — Ambulatory Visit: Payer: 59 | Admitting: Pulmonary Disease

## 2013-12-10 ENCOUNTER — Ambulatory Visit: Payer: 59 | Admitting: Family

## 2013-12-28 ENCOUNTER — Other Ambulatory Visit: Payer: Self-pay | Admitting: Family

## 2014-01-10 ENCOUNTER — Ambulatory Visit: Payer: 59 | Admitting: Pulmonary Disease

## 2014-01-28 ENCOUNTER — Ambulatory Visit: Payer: 59 | Admitting: Family

## 2014-02-17 ENCOUNTER — Ambulatory Visit: Payer: 59 | Admitting: Pulmonary Disease

## 2014-02-25 ENCOUNTER — Telehealth: Payer: Self-pay | Admitting: Family

## 2014-02-25 ENCOUNTER — Ambulatory Visit: Payer: 59 | Admitting: Family

## 2014-02-25 NOTE — Telephone Encounter (Signed)
Spoke to patient wife and she states that she had pressed option 2 to cancel this appointment. She will have patient call in to reschedule

## 2014-02-25 NOTE — Telephone Encounter (Signed)
Please contact pt to reschedule

## 2014-02-25 NOTE — Telephone Encounter (Signed)
Patient scheduled for 6 month follow up  Did not come

## 2014-03-26 ENCOUNTER — Other Ambulatory Visit: Payer: Self-pay | Admitting: Family

## 2014-03-28 NOTE — Telephone Encounter (Signed)
Pt last seen 04/2013 and advised 6 month follow up which he has cancelled 4 times. 30 DAY supply sent to pharmacy as pt is past due for follow up and will need to be seen before further refills can be given.

## 2014-03-28 NOTE — Telephone Encounter (Signed)
Informed patient of this and he scheduled appointment for late September

## 2014-04-13 ENCOUNTER — Ambulatory Visit (INDEPENDENT_AMBULATORY_CARE_PROVIDER_SITE_OTHER): Payer: 59 | Admitting: Family

## 2014-04-13 ENCOUNTER — Encounter: Payer: Self-pay | Admitting: Family

## 2014-04-13 VITALS — BP 137/78 | HR 60 | Temp 98.0°F | Resp 16 | Ht 70.0 in | Wt 313.0 lb

## 2014-04-13 DIAGNOSIS — I10 Essential (primary) hypertension: Secondary | ICD-10-CM

## 2014-04-13 DIAGNOSIS — R252 Cramp and spasm: Secondary | ICD-10-CM

## 2014-04-13 DIAGNOSIS — Z23 Encounter for immunization: Secondary | ICD-10-CM

## 2014-04-13 DIAGNOSIS — E785 Hyperlipidemia, unspecified: Secondary | ICD-10-CM

## 2014-04-13 DIAGNOSIS — G4733 Obstructive sleep apnea (adult) (pediatric): Secondary | ICD-10-CM

## 2014-04-13 LAB — BASIC METABOLIC PANEL
BUN: 17 mg/dL (ref 6–23)
CALCIUM: 10.1 mg/dL (ref 8.4–10.5)
CO2: 22 mEq/L (ref 19–32)
Chloride: 108 mEq/L (ref 96–112)
Creatinine, Ser: 0.9 mg/dL (ref 0.4–1.5)
GFR: 95.22 mL/min (ref >60.00)
Glucose, Bld: 102 mg/dL — ABNORMAL HIGH (ref 70–99)
Potassium: 4.2 mEq/L (ref 3.5–5.1)
SODIUM: 136 meq/L (ref 135–145)

## 2014-04-13 LAB — LIPID PANEL
Cholesterol: 253 mg/dL — ABNORMAL HIGH (ref 0–200)
HDL: 28.8 mg/dL — AB (ref >39.00)
NONHDL: 224.2
Total CHOL/HDL Ratio: 9
Triglycerides: 234 mg/dL — ABNORMAL HIGH (ref 0.0–149.0)
VLDL: 46.8 mg/dL — AB (ref 0.0–40.0)

## 2014-04-13 LAB — LDL CHOLESTEROL, DIRECT: LDL DIRECT: 169.6 mg/dL

## 2014-04-13 MED ORDER — ATENOLOL 50 MG PO TABS: ORAL_TABLET | ORAL | Status: DC

## 2014-04-13 NOTE — Assessment & Plan Note (Signed)
Advised pt to stretch legs nightly and ensure adequate water intake.  Obtain bmet to assess electrolytes.

## 2014-04-13 NOTE — Progress Notes (Signed)
Subjective:    Patient ID: Micheal Mckinney, male    DOB: 05/19/1971, 43 y.o.   MRN: [id number]  HPI  Mr. Micheal Mckinney is a 43 y/o male who presents today for follow-up.  HTN: Currently taking 50 mg atenolol daily. Home reading 120-30's/80s. Denies chest pain/discomfort, shortness of breath, or edema. Has had calf cramps which he has had since a child which are unchanged. Denies any changes in vision.   BP Readings from Last 3 Encounters:  04/13/14 137/78  09/21/13 124/76  05/07/13 126/80    HLD: Not currently taking anything for cholesterol. Has not had cholesterol checked this year. He has lost 13 pounds by cutting out evening snacking.  Lab Results  Component Value Date   CHOL 227* 05/07/2013   HDL 29* 05/07/2013   LDLCALC 156* 05/07/2013   TRIG 211* 05/07/2013   CHOLHDL 7.8 05/07/2013   Wt Readings from Last 3 Encounters:  04/13/14 313 lb (141.976 kg)  05/07/13 326 lb 1.3 oz (147.909 kg)  03/05/13 300 lb (136.079 kg)   Been getting leg cramps mostly in the calves. Has been spreading to the legs.  - last 2 years they have worsened. Usually occurs at night.   OSA- reports non-compliance with bipap Review of Systems    See HPI.   Past Medical History  Diagnosis Date  . History of chicken pox   . Allergy     hay fever  . Hypertension   . OSA (obstructive sleep apnea) 01/26/2012    History   Social History  . Marital Status: Married    Spouse Name: N/A    Number of Children: N/A  . Years of Education: N/A   Occupational History  . Not on file.   Social History Main Topics  . Smoking status: Current Some Day Smoker -- 1.00 packs/day for 25 years    Types: Cigarettes    Last Attempt to Quit: 02/13/2012  . Smokeless tobacco: Not on file     Comment: cigarette on the weekends  . Alcohol Use: Yes  . Drug Use: Not on file  . Sexual Activity: Yes   Other Topics Concern  . Not on file   Social History Narrative   married    Past Surgical History    Procedure Laterality Date  . Irrigation and debridement sebaceous cyst  2009     on back and neck  . Carpal tunnel release Right 05/04/13    Family History  Problem Relation Age of Onset  . Cancer Mother     breast    No Known Allergies  Current Outpatient Prescriptions on File Prior to Visit  Medication Sig Dispense Refill  . aspirin 81 MG tablet Take 81 mg by mouth daily.       No current facility-administered medications on file prior to visit.    BP 137/78  Pulse 60  Temp(Src) 98 F (36.7 C) (Oral)  Resp 16  Ht 5\' 10"  (1.778 m)  Wt 313 lb (141.976 kg)  BMI 44.91 kg/m2  SpO2 98%    Objective:   Physical Exam  Constitutional: He is oriented to person, place, and time. No distress.  Obese  Cardiovascular: Normal rate, regular rhythm, normal heart sounds and intact distal pulses.   Pulmonary/Chest: Effort normal and breath sounds normal.  Neurological: He is alert and oriented to person, place, and time.  Skin: Skin is warm and dry.  Psychiatric: He has a normal mood and affect. His behavior is normal. Judgment and  thought content normal.   BP 137/78  Pulse 60  Temp(Src) 98 F (36.7 C) (Oral)  Resp 16  Ht 5\' 10"  (1.778 m)  Wt 313 lb (141.976 kg)  BMI 44.91 kg/m2  SpO2 98% Nursing notes and vital signs reviewed.        Assessment & Plan:  Documentation completed by Marcos Eke, FNP as part of my orientation with Sandford Craze, FNP  I have personally seen and examined this patient and agree with Tammy Sours Calone's assessment and plan.

## 2014-04-13 NOTE — Assessment & Plan Note (Addendum)
BP stable on current meds. Continue same. Obtain bmet 

## 2014-04-13 NOTE — Patient Instructions (Signed)
Please schedule a complete physical at your convenience some time in the next 2-3 months. Complete lab work prior to leaving.

## 2014-04-13 NOTE — Progress Notes (Signed)
Pre visit review using our clinic review tool, if applicable. No additional management support is needed unless otherwise documented below in the visit note. 

## 2014-04-13 NOTE — Assessment & Plan Note (Signed)
Reinforced importance of compliance with CPAP.  

## 2014-04-13 NOTE — Assessment & Plan Note (Signed)
Obtain follow up lipid panel.  

## 2014-04-14 ENCOUNTER — Telehealth: Payer: Self-pay | Admitting: Family

## 2014-04-14 DIAGNOSIS — E785 Hyperlipidemia, unspecified: Secondary | ICD-10-CM

## 2014-04-14 MED ORDER — ATORVASTATIN CALCIUM 20 MG PO TABS: 20.0000 mg | ORAL_TABLET | Freq: Every day | ORAL | Status: DC

## 2014-04-14 NOTE — Telephone Encounter (Signed)
Please let pt know that his cholesterol is unfortunately higher than it was last time we checked.  I would like him to start atorvastatin. Repeat flp/lft in 6 weeks.  Call if unusual muscle pain while on this med.  Continue diet, exercise, weight loss efforts.

## 2014-04-14 NOTE — Telephone Encounter (Signed)
emmi emailed °

## 2014-04-15 NOTE — Telephone Encounter (Signed)
Notified pt and he voices understanding. Lab order entered for 05/27/14 at 8am.

## 2014-05-27 ENCOUNTER — Other Ambulatory Visit: Payer: 59

## 2014-05-31 ENCOUNTER — Ambulatory Visit: Payer: 59 | Admitting: Pulmonary Disease

## 2014-06-17 ENCOUNTER — Encounter: Payer: 59 | Admitting: Family

## 2014-10-10 ENCOUNTER — Other Ambulatory Visit: Payer: Self-pay | Admitting: Family

## 2014-11-21 ENCOUNTER — Other Ambulatory Visit: Payer: Self-pay | Admitting: Physician Assistant

## 2014-11-21 NOTE — Telephone Encounter (Signed)
Will defer refill to PCP as only filled by this provider in her absence.

## 2014-11-22 ENCOUNTER — Telehealth: Payer: Self-pay | Admitting: Family

## 2014-11-22 NOTE — Telephone Encounter (Signed)
30 day supply sent to pharmacy.  

## 2014-11-22 NOTE — Telephone Encounter (Signed)
30 day supply sent. Notified pt's wife.

## 2014-11-22 NOTE — Telephone Encounter (Signed)
Relation to pt: self  Call back number: [phone] [facility] [address] - [place], [place] - [address] [phone] (Phone) [phone] (Fax)         Reason for call:  Spouse requesing atenolol (TENORMIN) 50 MG tablet. Spouse states pt is completely out and pharmacy requested already. Please advise

## 2014-12-08 ENCOUNTER — Telehealth: Payer: Self-pay | Admitting: *Deleted

## 2014-12-08 NOTE — Telephone Encounter (Signed)
Unable to reach patient at time of Pre-Visit Call.  Left message to confirm time.

## 2014-12-09 ENCOUNTER — Telehealth: Payer: Self-pay | Admitting: Family

## 2014-12-09 ENCOUNTER — Encounter: Payer: 59 | Admitting: Family

## 2014-12-09 DIAGNOSIS — Z0289 Encounter for other administrative examinations: Secondary | ICD-10-CM

## 2014-12-09 NOTE — Telephone Encounter (Signed)
Appointment scheduled for 12/13/14

## 2014-12-09 NOTE — Telephone Encounter (Signed)
Patient is wanting to transfer from [place] to [place]. Is this ok? Thanks!

## 2014-12-09 NOTE — Telephone Encounter (Signed)
Im fine with that if PCP is ok.

## 2014-12-09 NOTE — Telephone Encounter (Signed)
OK with me.

## 2014-12-13 ENCOUNTER — Ambulatory Visit: Payer: 59 | Admitting: Physician Assistant

## 2014-12-14 ENCOUNTER — Telehealth: Payer: Self-pay

## 2014-12-14 NOTE — Telephone Encounter (Signed)
Pre visit call completed 

## 2014-12-16 ENCOUNTER — Encounter: Payer: Self-pay | Admitting: Physician Assistant

## 2014-12-16 ENCOUNTER — Ambulatory Visit (INDEPENDENT_AMBULATORY_CARE_PROVIDER_SITE_OTHER): Payer: 59 | Admitting: Physician Assistant

## 2014-12-16 VITALS — BP 150/82 | HR 61 | Temp 98.2°F | Ht 70.0 in | Wt 315.0 lb

## 2014-12-16 DIAGNOSIS — E785 Hyperlipidemia, unspecified: Secondary | ICD-10-CM

## 2014-12-16 DIAGNOSIS — I10 Essential (primary) hypertension: Secondary | ICD-10-CM

## 2014-12-16 LAB — LIPID PANEL
CHOL/HDL RATIO: 8
Cholesterol: 241 mg/dL — ABNORMAL HIGH (ref 0–200)
HDL: 29.5 mg/dL — AB (ref >39.00)
LDL CALC: 181 mg/dL — AB (ref 0–99)
NONHDL: 211.5
Triglycerides: 152 mg/dL — ABNORMAL HIGH (ref 0.0–149.0)
VLDL: 30.4 mg/dL (ref 0.0–40.0)

## 2014-12-16 MED ORDER — ATENOLOL-CHLORTHALIDONE 50-25 MG PO TABS: 1.0000 | ORAL_TABLET | Freq: Every day | ORAL | Status: DC

## 2014-12-16 NOTE — Assessment & Plan Note (Signed)
Uncontrolled. Weight loss encouraged. DASH diet encouraged. We'll change atenolol to Tenoretic 50-25 milligrams once daily. Follow-up in one month.

## 2014-12-16 NOTE — Progress Notes (Signed)
Pre visit review using our clinic review tool, if applicable. No additional management support is needed unless otherwise documented below in the visit note. 

## 2014-12-16 NOTE — Patient Instructions (Signed)
Please start new blood pressure medication daily as directed.  Read information below on the DASH diet. Stay active!  Please stop by the lab so we can recheck your cholesterol levels. I will call you with your results.  DASH Eating Plan DASH stands for "Dietary Approaches to Stop Hypertension." The DASH eating plan is a healthy eating plan that has been shown to reduce high blood pressure (hypertension). Additional health benefits may include reducing the risk of type 2 diabetes mellitus, heart disease, and stroke. The DASH eating plan may also help with weight loss. WHAT DO I NEED TO KNOW ABOUT THE DASH EATING PLAN? For the DASH eating plan, you will follow these general guidelines:  Choose foods with a percent daily value for sodium of less than 5% (as listed on the food label).  Use salt-free seasonings or herbs instead of table salt or sea salt.  Check with your health care provider or pharmacist before using salt substitutes.  Eat lower-sodium products, often labeled as "lower sodium" or "no salt added."  Eat fresh foods.  Eat more vegetables, fruits, and low-fat dairy products.  Choose whole grains. Look for the word "whole" as the first word in the ingredient list.  Choose fish and skinless chicken or [place] more often than red meat. Limit fish, poultry, and meat to 6 oz (170 g) each day.  Limit sweets, desserts, sugars, and sugary drinks.  Choose heart-healthy fats.  Limit cheese to 1 oz (28 g) per day.  Eat more home-cooked food and less restaurant, buffet, and fast food.  Limit fried foods.  Cook foods using methods other than frying.  Limit canned vegetables. If you do use them, rinse them well to decrease the sodium.  When eating at a restaurant, ask that your food be prepared with less salt, or no salt if possible. WHAT FOODS CAN I EAT? Seek help from a dietitian for individual calorie needs. Grains Whole grain or whole wheat bread. Brown rice. Whole grain or  whole wheat pasta. Quinoa, bulgur, and whole grain cereals. Low-sodium cereals. Corn or whole wheat flour tortillas. Whole grain cornbread. Whole grain crackers. Low-sodium crackers. Vegetables Fresh or frozen vegetables (raw, steamed, roasted, or grilled). Low-sodium or reduced-sodium tomato and vegetable juices. Low-sodium or reduced-sodium tomato sauce and paste. Low-sodium or reduced-sodium canned vegetables.  Fruits All fresh, canned (in natural juice), or frozen fruits. Meat and Other Protein Products Ground beef (85% or leaner), grass-fed beef, or beef trimmed of fat. Skinless chicken or [place]key. Ground chicken or [place]key. Pork trimmed of fat. All fish and seafood. Eggs. Dried beans, peas, or lentils. Unsalted nuts and seeds. Unsalted canned beans. Dairy Low-fat dairy products, such as skim or 1% milk, 2% or reduced-fat cheeses, low-fat ricotta or cottage cheese, or plain low-fat yogurt. Low-sodium or reduced-sodium cheeses. Fats and Oils Tub margarines without trans fats. Light or reduced-fat mayonnaise and salad dressings (reduced sodium). Avocado. Safflower, olive, or canola oils. Natural peanut or almond butter. Other Unsalted popcorn and pretzels. The items listed above may not be a complete list of recommended foods or beverages. Contact your dietitian for more options. WHAT FOODS ARE NOT RECOMMENDED? Grains White bread. White pasta. White rice. Refined cornbread. Bagels and croissants. Crackers that contain trans fat. Vegetables Creamed or fried vegetables. Vegetables in a cheese sauce. Regular canned vegetables. Regular canned tomato sauce and paste. Regular tomato and vegetable juices. Fruits Dried fruits. Canned fruit in light or heavy syrup. Fruit juice. Meat and Other Protein Products Fatty cuts of  meat. Ribs, chicken wings, bacon, sausage, bologna, salami, chitterlings, fatback, hot dogs, bratwurst, and packaged luncheon meats. Salted nuts and seeds. Canned beans with  salt. Dairy Whole or 2% milk, cream, half-and-half, and cream cheese. Whole-fat or sweetened yogurt. Full-fat cheeses or blue cheese. Nondairy creamers and whipped toppings. Processed cheese, cheese spreads, or cheese curds. Condiments Onion and garlic salt, seasoned salt, table salt, and sea salt. Canned and packaged gravies. Worcestershire sauce. Tartar sauce. Barbecue sauce. Teriyaki sauce. Soy sauce, including reduced sodium. Steak sauce. Fish sauce. Oyster sauce. Cocktail sauce. Horseradish. Ketchup and mustard. Meat flavorings and tenderizers. Bouillon cubes. Hot sauce. Tabasco sauce. Marinades. Taco seasonings. Relishes. Fats and Oils Butter, stick margarine, lard, shortening, ghee, and bacon fat. Coconut, palm kernel, or palm oils. Regular salad dressings. Other Pickles and olives. Salted popcorn and pretzels. The items listed above may not be a complete list of foods and beverages to avoid. Contact your dietitian for more information. WHERE CAN I FIND MORE INFORMATION? National Heart, Lung, and Blood Institute: travelstabloid.com Document Released: 06/20/2011 Document Revised: 11/15/2013 Document Reviewed: 05/05/2013 [facility] Patient Information 2015 [place], [place]. This information is not intended to replace advice given to you by your health care provider. Make sure you discuss any questions you have with your health care provider.

## 2014-12-16 NOTE — Progress Notes (Signed)
   Patient presents to clinic today to transfer care from previous PCP.  Patient here to discuss medication management for hypertension.  Patient currently on Atenolol 50 mg daily.  Endorses BP elevated at home to 150s-160s. Patient denies chest pain, palpitations, lightheadedness, dizziness, vision changes or frequent headaches.  Endorses previously placed on Lipitor for high cholesterol but has not been taking due to reading potential side effects.  Has been taking a cholesterol supplement over-the-counter but cannot remember the name.  Past Medical History  Diagnosis Date  . History of chicken pox   . Allergy     hay fever  . Hypertension   . OSA (obstructive sleep apnea) 01/26/2012  . Hyperlipidemia     Current Outpatient Prescriptions on File Prior to Visit  Medication Sig Dispense Refill  . aspirin 81 MG tablet Take 81 mg by mouth daily.     No current facility-administered medications on file prior to visit.    No Known Allergies  Family History  Problem Relation Age of Onset  . Cancer Mother     breast    History   Social History  . Marital Status: Married    Spouse Name: N/A  . Number of Children: N/A  . Years of Education: N/A   Social History Main Topics  . Smoking status: Current Some Day Smoker -- 1.00 packs/day for 25 years    Types: Cigarettes    Last Attempt to Quit: 02/13/2012  . Smokeless tobacco: Not on file     Comment: cigarette on the weekends  . Alcohol Use: Yes  . Drug Use: Not on file  . Sexual Activity: Yes   Other Topics Concern  . None   Social History Narrative   married   Review of Systems - See HPI.  All other ROS are negative.  BP 150/82 mmHg  Pulse 61  Temp(Src) 98.2 F (36.8 C) (Oral)  Ht 5\' 10"  (1.778 m)  Wt 315 lb (142.883 kg)  BMI 45.20 kg/m2  SpO2 98%  Physical Exam  Constitutional: He is oriented to person, place, and time and well-developed, well-nourished, and in no distress.  HENT:  Head: Normocephalic and  atraumatic.  Eyes: Conjunctivae are normal.  Neck: Neck supple.  Cardiovascular: Normal rate, regular rhythm, normal heart sounds and intact distal pulses.   Pulmonary/Chest: Effort normal and breath sounds normal. No respiratory distress. He has no wheezes. He has no rales. He exhibits no tenderness.  Neurological: He is alert and oriented to person, place, and time.  Skin: Skin is warm and dry. No rash noted.  Psychiatric: Affect normal.  Vitals reviewed.  Assessment/Plan: Hyperlipidemia We'll recheck fasting lipid panel today   HTN (hypertension) Uncontrolled. Weight loss encouraged. DASH diet encouraged. We'll change atenolol to Tenoretic 50-25 milligrams once daily. Follow-up in one month.

## 2014-12-16 NOTE — Assessment & Plan Note (Signed)
We'll recheck fasting lipid panel today

## 2014-12-19 ENCOUNTER — Telehealth: Payer: Self-pay | Admitting: *Deleted

## 2014-12-19 DIAGNOSIS — E785 Hyperlipidemia, unspecified: Secondary | ICD-10-CM

## 2014-12-19 MED ORDER — SIMVASTATIN 10 MG PO TABS: 10.0000 mg | ORAL_TABLET | Freq: Every day | ORAL | Status: DC

## 2014-12-19 NOTE — Telephone Encounter (Signed)
-----   Message from Waldon MerlWilliam C Martin, PA-C sent at 12/17/2014  7:36 AM EDT ----- Cholesterol results are in -- LDL (lousy) cholesterol has increased from 156-181.  I would highly encourage him to reconsider lipitor or to let me start him on a different cholesterol-lowering agent like simvastatin or Mevacor.  Dietary changes would make an impact -- limit foods high in cholesterol and saturated fats.  Increase aerobic exercise every few weeks to reach a goal of 150 minutes per week.

## 2014-12-19 NOTE — Telephone Encounter (Signed)
Called and spoke with the pt and informed him of recent lab results and note.  Pt verbalized understanding.  Pt stated that he will try the Simvastatin.  New prescription was sent to the pharmacy by e-script.  Pt is scheduled to follow-up in 4 weeks.//AB/CMA

## 2015-01-13 ENCOUNTER — Encounter: Payer: Self-pay | Admitting: Physician Assistant

## 2015-01-13 ENCOUNTER — Ambulatory Visit (INDEPENDENT_AMBULATORY_CARE_PROVIDER_SITE_OTHER): Payer: 59 | Admitting: Physician Assistant

## 2015-01-13 VITALS — BP 138/82 | HR 76 | Temp 98.2°F | Ht 70.0 in | Wt 308.6 lb

## 2015-01-13 DIAGNOSIS — I10 Essential (primary) hypertension: Secondary | ICD-10-CM | POA: Diagnosis not present

## 2015-01-13 NOTE — Patient Instructions (Signed)
Please continue medications as directed. Stay well hydrated. Continue diet and exercise.  DASH Eating Plan DASH stands for "Dietary Approaches to Stop Hypertension." The DASH eating plan is a healthy eating plan that has been shown to reduce high blood pressure (hypertension). Additional health benefits may include reducing the risk of type 2 diabetes mellitus, heart disease, and stroke. The DASH eating plan may also help with weight loss. WHAT DO I NEED TO KNOW ABOUT THE DASH EATING PLAN? For the DASH eating plan, you will follow these general guidelines:  Choose foods with a percent daily value for sodium of less than 5% (as listed on the food label).  Use salt-free seasonings or herbs instead of table salt or sea salt.  Check with your health care provider or pharmacist before using salt substitutes.  Eat lower-sodium products, often labeled as "lower sodium" or "no salt added."  Eat fresh foods.  Eat more vegetables, fruits, and low-fat dairy products.  Choose whole grains. Look for the word "whole" as the first word in the ingredient list.  Choose fish and skinless chicken or [place] more often than red meat. Limit fish, poultry, and meat to 6 oz (170 g) each day.  Limit sweets, desserts, sugars, and sugary drinks.  Choose heart-healthy fats.  Limit cheese to 1 oz (28 g) per day.  Eat more home-cooked food and less restaurant, buffet, and fast food.  Limit fried foods.  Cook foods using methods other than frying.  Limit canned vegetables. If you do use them, rinse them well to decrease the sodium.  When eating at a restaurant, ask that your food be prepared with less salt, or no salt if possible. WHAT FOODS CAN I EAT? Seek help from a dietitian for individual calorie needs. Grains Whole grain or whole wheat bread. Brown rice. Whole grain or whole wheat pasta. Quinoa, bulgur, and whole grain cereals. Low-sodium cereals. Corn or whole wheat flour tortillas. Whole grain  cornbread. Whole grain crackers. Low-sodium crackers. Vegetables Fresh or frozen vegetables (raw, steamed, roasted, or grilled). Low-sodium or reduced-sodium tomato and vegetable juices. Low-sodium or reduced-sodium tomato sauce and paste. Low-sodium or reduced-sodium canned vegetables.  Fruits All fresh, canned (in natural juice), or frozen fruits. Meat and Other Protein Products Ground beef (85% or leaner), grass-fed beef, or beef trimmed of fat. Skinless chicken or [place]. Ground chicken or [place]. Pork trimmed of fat. All fish and seafood. Eggs. Dried beans, peas, or lentils. Unsalted nuts and seeds. Unsalted canned beans. Dairy Low-fat dairy products, such as skim or 1% milk, 2% or reduced-fat cheeses, low-fat ricotta or cottage cheese, or plain low-fat yogurt. Low-sodium or reduced-sodium cheeses. Fats and Oils Tub margarines without trans fats. Light or reduced-fat mayonnaise and salad dressings (reduced sodium). Avocado. Safflower, olive, or canola oils. Natural peanut or almond butter. Other Unsalted popcorn and pretzels. The items listed above may not be a complete list of recommended foods or beverages. Contact your dietitian for more options. WHAT FOODS ARE NOT RECOMMENDED? Grains White bread. White pasta. White rice. Refined cornbread. Bagels and croissants. Crackers that contain trans fat. Vegetables Creamed or fried vegetables. Vegetables in a cheese sauce. Regular canned vegetables. Regular canned tomato sauce and paste. Regular tomato and vegetable juices. Fruits Dried fruits. Canned fruit in light or heavy syrup. Fruit juice. Meat and Other Protein Products Fatty cuts of meat. Ribs, chicken wings, bacon, sausage, bologna, salami, chitterlings, fatback, hot dogs, bratwurst, and packaged luncheon meats. Salted nuts and seeds. Canned beans with salt. Dairy Whole  or 2% milk, cream, half-and-half, and cream cheese. Whole-fat or sweetened yogurt. Full-fat cheeses or blue cheese.  Nondairy creamers and whipped toppings. Processed cheese, cheese spreads, or cheese curds. Condiments Onion and garlic salt, seasoned salt, table salt, and sea salt. Canned and packaged gravies. Worcestershire sauce. Tartar sauce. Barbecue sauce. Teriyaki sauce. Soy sauce, including reduced sodium. Steak sauce. Fish sauce. Oyster sauce. Cocktail sauce. Horseradish. Ketchup and mustard. Meat flavorings and tenderizers. Bouillon cubes. Hot sauce. Tabasco sauce. Marinades. Taco seasonings. Relishes. Fats and Oils Butter, stick margarine, lard, shortening, ghee, and bacon fat. Coconut, palm kernel, or palm oils. Regular salad dressings. Other Pickles and olives. Salted popcorn and pretzels. The items listed above may not be a complete list of foods and beverages to avoid. Contact your dietitian for more information. WHERE CAN I FIND MORE INFORMATION? National [employer], [employer], and [employer] Institute: travelstabloid.com Document Released: 06/20/2011 Document Revised: 11/15/2013 Document Reviewed: 05/05/2013 [facility] Patient Information 2015 [place], [place]. This information is not intended to replace advice given to you by your health care provider. Make sure you discuss any questions you have with your health care provider.

## 2015-01-13 NOTE — Progress Notes (Signed)
Pre visit review using our clinic review tool, if applicable. No additional management support is needed unless otherwise documented below in the visit note. 

## 2015-01-13 NOTE — Progress Notes (Signed)
Patient presents to clinic today for 4-week follow-up of hypertension. Patient on Atenolol-Chlorthalidone 50-25 mg daily. DASH diet encouraged. Handout given. Patient endorses taking medication as directed.  Endorses he is feeling better. Patient denies chest pain, palpitations, lightheadedness, dizziness, vision changes or frequent headaches.  Endorses BP measurements at home ranging 120s/70s.   Past Medical History  Diagnosis Date  . History of chicken pox   . Allergy     hay fever  . Hypertension   . OSA (obstructive sleep apnea) 01/26/2012  . Hyperlipidemia     Current Outpatient Prescriptions on File Prior to Visit  Medication Sig Dispense Refill  . aspirin 81 MG tablet Take 81 mg by mouth daily.    Marland Kitchen atenolol-chlorthalidone (TENORETIC) 50-25 MG per tablet Take 1 tablet by mouth daily. 30 tablet 1  . simvastatin (ZOCOR) 10 MG tablet Take 1 tablet (10 mg total) by mouth at bedtime. 30 tablet 1   No current facility-administered medications on file prior to visit.    No Known Allergies  Family History  Problem Relation Age of Onset  . Cancer Mother     breast    History   Social History  . Marital Status: Married    Spouse Name: N/A  . Number of Children: N/A  . Years of Education: N/A   Social History Main Topics  . Smoking status: Current Some Day Smoker -- 1.00 packs/day for 25 years    Types: Cigarettes    Last Attempt to Quit: 02/13/2012  . Smokeless tobacco: Not on file     Comment: cigarette on the weekends  . Alcohol Use: Yes  . Drug Use: Not on file  . Sexual Activity: Yes   Other Topics Concern  . None   Social History Narrative   married   Review of Systems - See HPI.  All other ROS are negative.  BP 138/82 mmHg  Pulse 76  Temp(Src) 98.2 F (36.8 C) (Oral)  Ht  (1.778 m)  Wt 308 lb 9.6 oz (139.98 kg)  BMI 44.28 kg/m2  SpO2 97%  Physical Exam  Constitutional: He is oriented to person, place, and time and well-developed,  well-nourished, and in no distress.  HENT:  Head: Normocephalic and atraumatic.  Eyes: Conjunctivae are normal.  Neck: Neck supple.  Cardiovascular: Normal rate, regular rhythm, normal heart sounds and intact distal pulses.   Pulmonary/Chest: Effort normal and breath sounds normal. No respiratory distress. He has no wheezes. He has no rales. He exhibits no tenderness.  Neurological: He is alert and oriented to person, place, and time.  Skin: Skin is warm and dry. No rash noted.  Psychiatric: Affect normal.  Vitals reviewed.   Recent Results (from the past 2160 hour(s))  Lipid Profile     Status: Abnormal   Collection Time: 12/16/14  8:40 AM  Result Value Ref Range   Cholesterol 241 (H) 0 - 200 mg/dL    Comment: ATP III Classification       Desirable:  < 200 mg/dL               Borderline High:  200 - 239 mg/dL          High:  > = 696 mg/dL   Triglycerides 295.2 (H) 0.0 - 149.0 mg/dL    Comment: Normal:  <841 mg/dLBorderline High:  150 - 199 mg/dL   HDL 32.44 (L) >01.02 mg/dL   VLDL 72.5 0.0 - 36.6 mg/dL   LDL Cholesterol 440 (H) 0 -  99 mg/dL   Total CHOL/HDL Ratio 8     Comment:                Men          Women1/2 Average Risk     3.4          3.3Average Risk          5.0          4.42X Average Risk          9.6          7.13X Average Risk          15.0          11.0                       NonHDL 211.50     Comment: NOTE:  Non-HDL goal should be 30 mg/dL higher than patient's LDL goal (i.e. LDL goal of < 70 mg/dL, would have non-HDL goal of < 100 mg/dL)    Assessment/Plan: HTN (hypertension) Much improved. Is dieting with loss of weight. Continue diet and exercise regimen. Continue meds. Follow-up 3 months.

## 2015-01-13 NOTE — Assessment & Plan Note (Signed)
Much improved. Is dieting with loss of weight. Continue diet and exercise regimen. Continue meds. Follow-up 3 months.

## 2015-01-17 ENCOUNTER — Encounter: Payer: 59 | Admitting: Family

## 2015-02-06 ENCOUNTER — Other Ambulatory Visit: Payer: Self-pay | Admitting: Physician Assistant

## 2015-02-06 ENCOUNTER — Telehealth: Payer: Self-pay | Admitting: Physician Assistant

## 2015-02-06 NOTE — Telephone Encounter (Signed)
Relation to pt: self  Call back number: [phone] Pharmacy:  [facility] [address], [place] - [address] [phone] (Phone) [phone] (Fax)         Reason for call:  Patient requesting atenolol-chlorthalidone (TENORETIC) 50-25 MG per tablet and simvastatin (ZOCOR) 10 MG tablet

## 2015-02-06 NOTE — Telephone Encounter (Signed)
Called and spoke with the pt and informed him the prescription have been approved and sent to the pharmacy.  Pt verbalized  understanding.//AB/CMA

## 2015-04-14 ENCOUNTER — Ambulatory Visit: Payer: 59 | Admitting: Physician Assistant

## 2015-04-28 ENCOUNTER — Encounter: Payer: Self-pay | Admitting: Physician Assistant

## 2015-04-28 ENCOUNTER — Ambulatory Visit (INDEPENDENT_AMBULATORY_CARE_PROVIDER_SITE_OTHER): Payer: 59 | Admitting: Physician Assistant

## 2015-04-28 VITALS — BP 132/84 | HR 62 | Temp 98.1°F | Resp 18 | Ht 70.0 in | Wt 315.5 lb

## 2015-04-28 DIAGNOSIS — I10 Essential (primary) hypertension: Secondary | ICD-10-CM

## 2015-04-28 DIAGNOSIS — Z23 Encounter for immunization: Secondary | ICD-10-CM

## 2015-04-28 LAB — BASIC METABOLIC PANEL
BUN: 14 mg/dL (ref 6–23)
CO2: 27 meq/L (ref 19–32)
Calcium: 10.1 mg/dL (ref 8.4–10.5)
Chloride: 102 mEq/L (ref 96–112)
Creatinine, Ser: 0.9 mg/dL (ref 0.40–1.50)
GFR: 97.19 mL/min (ref >60.00)
Glucose, Bld: 108 mg/dL — ABNORMAL HIGH (ref 70–99)
POTASSIUM: 3.5 meq/L (ref 3.5–5.1)
SODIUM: 139 meq/L (ref 135–145)

## 2015-04-28 NOTE — Progress Notes (Signed)
   Patient presents to clinic today for follow-up of hypertension. Is taking medication as directed. Patient denies chest pain, palpitations, lightheadedness, dizziness, vision changes or frequent headaches.    Past Medical History  Diagnosis Date  . History of chicken pox   . Allergy     hay fever  . Hypertension   . OSA (obstructive sleep apnea) 01/26/2012  . Hyperlipidemia     Current Outpatient Prescriptions on File Prior to Visit  Medication Sig Dispense Refill  . aspirin 81 MG tablet Take 81 mg by mouth daily.    Marland Kitchen. atenolol-chlorthalidone (TENORETIC) 50-25 MG per tablet TAKE 1 TABLET BY MOUTH DAILY. 30 tablet 5  . simvastatin (ZOCOR) 10 MG tablet TAKE 1 TABLET (10 MG TOTAL) BY MOUTH AT BEDTIME. 30 tablet 5   No current facility-administered medications on file prior to visit.    No Known Allergies  Family History  Problem Relation Age of Onset  . Cancer Mother     breast    Social History   Social History  . Marital Status: Married    Spouse Name: N/A  . Number of Children: N/A  . Years of Education: N/A   Social History Main Topics  . Smoking status: Current Some Day Smoker -- 1.00 packs/day for 25 years    Types: Cigarettes    Last Attempt to Quit: 02/13/2012  . Smokeless tobacco: None     Comment: cigarette on the weekends  . Alcohol Use: Yes  . Drug Use: None  . Sexual Activity: Yes   Other Topics Concern  . None   Social History Narrative   married   Review of Systems - See HPI.  All other ROS are negative.  BP 132/84 mmHg  Pulse 62  Temp(Src) 98.1 F (36.7 C) (Oral)  Resp 18  Ht 5\' 10"  (1.778 m)  Wt 315 lb 8 oz (143.11 kg)  BMI 45.27 kg/m2  SpO2 99%  Physical Exam  Constitutional: He is oriented to person, place, and time and well-developed, well-nourished, and in no distress.  HENT:  Head: Normocephalic and atraumatic.  Eyes: Conjunctivae are normal.  Cardiovascular: Normal rate, regular rhythm, normal heart sounds and intact distal  pulses.   Pulmonary/Chest: Effort normal and breath sounds normal. No respiratory distress. He has no wheezes. He has no rales. He exhibits no tenderness.  Neurological: He is alert and oriented to person, place, and time.  Skin: Skin is warm and dry. No rash noted.  Psychiatric: Affect normal.  Vitals reviewed.   No results found for this or any previous visit (from the past 2160 hour(s)).  Assessment/Plan: HTN (hypertension) Stable. Asymptomatic. Continue current regimen. Will check BMP today. Follow-up 6 months.

## 2015-04-28 NOTE — Patient Instructions (Signed)
Please continue medications as directed. Stay well hydrated. Keep active to promote weight loss. This will also help with blood pressure. Follow-up in 6 months for a physical.  Hypertension Hypertension, commonly called high blood pressure, is when the force of blood pumping through your arteries is too strong. Your arteries are the blood vessels that carry blood from your heart throughout your body. A blood pressure reading consists of a higher number over a lower number, such as 110/72. The higher number (systolic) is the pressure inside your arteries when your heart pumps. The lower number (diastolic) is the pressure inside your arteries when your heart relaxes. Ideally you want your blood pressure below 120/80. Hypertension forces your heart to work harder to pump blood. Your arteries may become narrow or stiff. Having untreated or uncontrolled hypertension can cause heart attack, stroke, kidney disease, and other problems. RISK FACTORS Some risk factors for high blood pressure are controllable. Others are not.  Risk factors you cannot control include:   Race. You may be at higher risk if you are African American.  Age. Risk increases with age.  Gender. Men are at higher risk than women before age 44 years. After age 44, women are at higher risk than men. Risk factors you can control include:  Not getting enough exercise or physical activity.  Being overweight.  Getting too much fat, sugar, calories, or salt in your diet.  Drinking too much alcohol. SIGNS AND SYMPTOMS Hypertension does not usually cause signs or symptoms. Extremely high blood pressure (hypertensive crisis) may cause headache, anxiety, shortness of breath, and nosebleed. DIAGNOSIS To check if you have hypertension, your health care provider will measure your blood pressure while you are seated, with your arm held at the level of your heart. It should be measured at least twice using the same arm. Certain conditions  can cause a difference in blood pressure between your right and left arms. A blood pressure reading that is higher than normal on one occasion does not mean that you need treatment. If it is not clear whether you have high blood pressure, you may be asked to return on a different day to have your blood pressure checked again. Or, you may be asked to monitor your blood pressure at home for 1 or more weeks. TREATMENT Treating high blood pressure includes making lifestyle changes and possibly taking medicine. Living a healthy lifestyle can help lower high blood pressure. You may need to change some of your habits. Lifestyle changes may include:  Following the DASH diet. This diet is high in fruits, vegetables, and whole grains. It is low in salt, red meat, and added sugars.  Keep your sodium intake below 2,300 mg per day.  Getting at least 30-45 minutes of aerobic exercise at least 4 times per week.  Losing weight if necessary.  Not smoking.  Limiting alcoholic beverages.  Learning ways to reduce stress. Your health care provider may prescribe medicine if lifestyle changes are not enough to get your blood pressure under control, and if one of the following is true:  You are 7118-44 years of age and your systolic blood pressure is above 140.  You are 44 years of age or older, and your systolic blood pressure is above 150.  Your diastolic blood pressure is above 90.  You have diabetes, and your systolic blood pressure is over 140 or your diastolic blood pressure is over 90.  You have kidney disease and your blood pressure is above 140/90.  You have  heart disease and your blood pressure is above 140/90. Your personal target blood pressure may vary depending on your medical conditions, your age, and other factors. HOME CARE INSTRUCTIONS  Have your blood pressure rechecked as directed by your health care provider.   Take medicines only as directed by your health care provider. Follow the  directions carefully. Blood pressure medicines must be taken as prescribed. The medicine does not work as well when you skip doses. Skipping doses also puts you at risk for problems.  Do not smoke.   Monitor your blood pressure at home as directed by your health care provider. SEEK MEDICAL CARE IF:   You think you are having a reaction to medicines taken.  You have recurrent headaches or feel dizzy.  You have swelling in your ankles.  You have trouble with your vision. SEEK IMMEDIATE MEDICAL CARE IF:  You develop a severe headache or confusion.  You have unusual weakness, numbness, or feel faint.  You have severe chest or abdominal pain.  You vomit repeatedly.  You have trouble breathing. MAKE SURE YOU:   Understand these instructions.  Will watch your condition.  Will get help right away if you are not doing well or get worse.   This information is not intended to replace advice given to you by your health care provider. Make sure you discuss any questions you have with your health care provider.   Document Released: 07/01/2005 Document Revised: 11/15/2014 Document Reviewed: 04/23/2013 Elsevier Interactive Patient Education [employer]

## 2015-04-28 NOTE — Assessment & Plan Note (Signed)
Stable. Asymptomatic. Continue current regimen. Will check BMP today. Follow-up 6 months.

## 2015-04-28 NOTE — Progress Notes (Signed)
Pre visit review using our clinic review tool, if applicable. No additional management support is needed unless otherwise documented below in the visit note/SLS  

## 2015-08-07 ENCOUNTER — Other Ambulatory Visit: Payer: Self-pay | Admitting: Physician Assistant

## 2015-08-14 ENCOUNTER — Telehealth: Payer: Self-pay

## 2015-08-14 NOTE — Telephone Encounter (Signed)
Received fax  Date: 08/12/15  Time: 8:44 am  Nurse:  Rubie Maid, RN  Chief complaint:  Foot pain Initial comment:  Caller states husband has developed gout flare up in RT foot; wants appt  Reason for admission:  Caller states husband has developed gout flare up in right foot big toe which is painful and red.  Started a month ago.  Tried everything suggested online.  Almost went away til 2 days ago then swelling started again.    Disposition:  See physician within 24 hours  Per chart, pt has an appt scheduled with Malva Cogan, PA-C on 08/15/15 @ 7:15 am.

## 2015-08-15 ENCOUNTER — Encounter: Payer: Self-pay | Admitting: Physician Assistant

## 2015-08-15 ENCOUNTER — Ambulatory Visit (INDEPENDENT_AMBULATORY_CARE_PROVIDER_SITE_OTHER): Payer: 59 | Admitting: Physician Assistant

## 2015-08-15 VITALS — BP 132/77 | HR 61 | Temp 97.7°F | Resp 16 | Ht 70.0 in | Wt 316.5 lb

## 2015-08-15 DIAGNOSIS — I10 Essential (primary) hypertension: Secondary | ICD-10-CM

## 2015-08-15 DIAGNOSIS — M10271 Drug-induced gout, right ankle and foot: Secondary | ICD-10-CM | POA: Diagnosis not present

## 2015-08-15 MED ORDER — LISINOPRIL 10 MG PO TABS: 10.0000 mg | ORAL_TABLET | Freq: Every day | ORAL | Status: DC

## 2015-08-15 MED ORDER — ATENOLOL 50 MG PO TABS: 50.0000 mg | ORAL_TABLET | Freq: Every day | ORAL | Status: DC

## 2015-08-15 NOTE — Assessment & Plan Note (Signed)
Will stop the Atenolol-Chlorthalidone combination pill. Will start Atenolol 50 mg and Lisinopril 10 mg daily. DASH diet encouraged. Follow-up in 3-4 weeks for reassessment.

## 2015-08-15 NOTE — Patient Instructions (Signed)
Stop your Atenolol-Chlorthalidone. Start the plain Atenolol and the Lisinopril as directed. Follow diet guidelines below. Follow-up with me in 1 month.  Gout Gout is when your joints become red, sore, and swell (inflamed). This is caused by the buildup of uric acid crystals in the joints. Uric acid is a chemical that is normally in the blood. If the level of uric acid gets too high in the blood, these crystals form in your joints and tissues. Over time, these crystals can form into masses near the joints and tissues. These masses can destroy bone and cause the bone to look misshapen (deformed). HOME CARE   Do not take aspirin for pain.  Only take medicine as told by your doctor.  Rest the joint as much as you can. When in bed, keep sheets and blankets off painful areas.  Keep the sore joints raised (elevated).  Put warm or cold packs on painful joints. Use of warm or cold packs depends on which works best for you.  Use crutches if the painful joint is in your leg.  Drink enough fluids to keep your pee (urine) clear or pale yellow. Limit alcohol, sugary drinks, and drinks with fructose in them.  Follow your diet instructions. Pay careful attention to how much protein you eat. Include fruits, vegetables, whole grains, and fat-free or low-fat milk products in your daily diet. Talk to your doctor or dietitian about the use of coffee, vitamin C, and cherries. These may help lower uric acid levels.  Keep a healthy body weight. GET HELP RIGHT AWAY IF:   You have watery poop (diarrhea), throw up (vomit), or have any side effects from medicines.  You do not feel better in 24 hours, or you are getting worse.  Your joint becomes suddenly more tender, and you have chills or a fever. MAKE SURE YOU:   Understand these instructions.  Will watch your condition.  Will get help right away if you are not doing well or get worse.   This information is not intended to replace advice given to you  by your health care provider. Make sure you discuss any questions you have with your health care provider.   Document Released: 04/09/2008 Document Revised: 07/22/2014 Document Reviewed: 02/12/2012 Elsevier Interactive Patient Education [employer]

## 2015-08-15 NOTE — Progress Notes (Signed)
Pre visit review using our clinic review tool, if applicable. No additional management support is needed unless otherwise documented below in the visit note/SLS  

## 2015-08-15 NOTE — Progress Notes (Signed)
   Patient presents to clinic today c/o 2 gout attacks of the R great toe  in the past 3 weeks, most recently this past weekend. Was seen at an Urgent Care this Sunday and was given a prescription for Indomethacin with resolution of symptoms. Patient endorses he has never had gout flares before. Is currently on Atenolol-Chlorthalidone for BP. Endorses taking daily as directed.  BP Readings from Last 3 Encounters:  08/15/15 132/77  04/28/15 132/84  01/13/15 138/82   Past Medical History  Diagnosis Date  . History of chicken pox   . Allergy     hay fever  . Hypertension   . OSA (obstructive sleep apnea) 01/26/2012  . Hyperlipidemia     Current Outpatient Prescriptions on File Prior to Visit  Medication Sig Dispense Refill  . aspirin 81 MG tablet Take 81 mg by mouth daily.    . simvastatin (ZOCOR) 10 MG tablet TAKE 1 TABLET BY MOUTH ATBEDTIME. 30 tablet 5   No current facility-administered medications on file prior to visit.    No Known Allergies  Family History  Problem Relation Age of Onset  . Cancer Mother     breast    Social History   Social History  . Marital Status: Married    Spouse Name: N/A  . Number of Children: N/A  . Years of Education: N/A   Social History Main Topics  . Smoking status: Former Smoker -- 1.00 packs/day for 25 years    Types: Cigarettes    Quit date: 06/17/2012  . Smokeless tobacco: None     Comment: cigarette on the weekends  . Alcohol Use: 0.0 oz/week    0 Standard drinks or equivalent per week  . Drug Use: None  . Sexual Activity: Yes   Other Topics Concern  . None   Social History Narrative   married   Review of Systems - See HPI.  All other ROS are negative.  BP 132/77 mmHg  Pulse 61  Temp(Src) 97.7 F (36.5 C) (Oral)  Resp 16  Ht  (1.778 m)  Wt 316 lb 8 oz (143.563 kg)  BMI 45.41 kg/m2  SpO2 97%  Physical Exam  Constitutional: He is well-developed, well-nourished, and in no distress.  HENT:  Head:  Normocephalic and atraumatic.  Eyes: Conjunctivae are normal.  Cardiovascular: Normal rate, regular rhythm, normal heart sounds and intact distal pulses.   Pulmonary/Chest: Effort normal and breath sounds normal. No respiratory distress. He has no wheezes. He has no rales. He exhibits no tenderness.  Musculoskeletal:       Feet:  Skin: Skin is warm and dry. No rash noted.  Psychiatric: Affect normal.  Vitals reviewed.  No results found for this or any previous visit (from the past 2160 hour(s)).  Assessment/Plan: Acute drug-induced gout of right foot Acute attack has resolved. Discussed diet which is low in trigger foods. 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 Patient to follow dietary handout given. Will stop the Atenolol-Chlorthalidone and begin Atenolol and Lisinopril. If any recurrence occurs after cessation of the thiazide diuretic, will obtain uric acid level.  HTN (hypertension) Will stop the Atenolol-Chlorthalidone combination pill. Will start Atenolol 50 mg and Lisinopril 10 mg daily. DASH diet encouraged. Follow-up in 3-4 weeks for reassessment.

## 2015-08-15 NOTE — Assessment & Plan Note (Signed)
Acute attack has resolved. Discussed diet which is low in trigger foods. 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 Patient to follow dietary handout given. Will stop the Atenolol-Chlorthalidone and begin Atenolol and Lisinopril. If any recurrence occurs after cessation of the thiazide diuretic, will obtain uric acid level.

## 2015-09-15 ENCOUNTER — Encounter: Payer: Self-pay | Admitting: Physician Assistant

## 2015-09-15 ENCOUNTER — Ambulatory Visit (INDEPENDENT_AMBULATORY_CARE_PROVIDER_SITE_OTHER): Payer: 59 | Admitting: Physician Assistant

## 2015-09-15 VITALS — BP 132/75 | HR 62 | Temp 98.3°F | Ht 70.0 in | Wt 317.6 lb

## 2015-09-15 DIAGNOSIS — I10 Essential (primary) hypertension: Secondary | ICD-10-CM

## 2015-09-15 MED ORDER — LOSARTAN POTASSIUM 50 MG PO TABS: 50.0000 mg | ORAL_TABLET | Freq: Every day | ORAL | Status: DC

## 2015-09-15 NOTE — Patient Instructions (Signed)
Please stop the lisinopril and begin losartan 50 mg daily. Continue the Atenolol as directed. Continue following the diet below.  Follow-up in 1 month for a nurse visit to recheck BP. Follow-up with me in 3 months.  DASH Eating Plan DASH stands for "Dietary Approaches to Stop Hypertension." The DASH eating plan is a healthy eating plan that has been shown to reduce high blood pressure (hypertension). Additional health benefits may include reducing the risk of type 2 diabetes mellitus, heart disease, and stroke. The DASH eating plan may also help with weight loss. WHAT DO I NEED TO KNOW ABOUT THE DASH EATING PLAN? For the DASH eating plan, you will follow these general guidelines:  Choose foods with a percent daily value for sodium of less than 5% (as listed on the food label).  Use salt-free seasonings or herbs instead of table salt or sea salt.  Check with your health care provider or pharmacist before using salt substitutes.  Eat lower-sodium products, often labeled as "lower sodium" or "no salt added."  Eat fresh foods.  Eat more vegetables, fruits, and low-fat dairy products.  Choose whole grains. Look for the word "whole" as the first word in the ingredient list.  Choose fish and skinless chicken or [place] more often than red meat. Limit fish, poultry, and meat to 6 oz (170 g) each day.  Limit sweets, desserts, sugars, and sugary drinks.  Choose heart-healthy fats.  Limit cheese to 1 oz (28 g) per day.  Eat more home-cooked food and less restaurant, buffet, and fast food.  Limit fried foods.  Cook foods using methods other than frying.  Limit canned vegetables. If you do use them, rinse them well to decrease the sodium.  When eating at a restaurant, ask that your food be prepared with less salt, or no salt if possible. WHAT FOODS CAN I EAT? Seek help from a dietitian for individual calorie needs. Grains Whole grain or whole wheat bread. Brown rice. Whole grain or  whole wheat pasta. Quinoa, bulgur, and whole grain cereals. Low-sodium cereals. Corn or whole wheat flour tortillas. Whole grain cornbread. Whole grain crackers. Low-sodium crackers. Vegetables Fresh or frozen vegetables (raw, steamed, roasted, or grilled). Low-sodium or reduced-sodium tomato and vegetable juices. Low-sodium or reduced-sodium tomato sauce and paste. Low-sodium or reduced-sodium canned vegetables.  Fruits All fresh, canned (in natural juice), or frozen fruits. Meat and Other Protein Products Ground beef (85% or leaner), grass-fed beef, or beef trimmed of fat. Skinless chicken or [place]. Ground chicken or [place]. Pork trimmed of fat. All fish and seafood. Eggs. Dried beans, peas, or lentils. Unsalted nuts and seeds. Unsalted canned beans. Dairy Low-fat dairy products, such as skim or 1% milk, 2% or reduced-fat cheeses, low-fat ricotta or cottage cheese, or plain low-fat yogurt. Low-sodium or reduced-sodium cheeses. Fats and Oils Tub margarines without trans fats. Light or reduced-fat mayonnaise and salad dressings (reduced sodium). Avocado. Safflower, olive, or canola oils. Natural peanut or almond butter. Other Unsalted popcorn and pretzels. The items listed above may not be a complete list of recommended foods or beverages. Contact your dietitian for more options. WHAT FOODS ARE NOT RECOMMENDED? Grains White bread. White pasta. White rice. Refined cornbread. Bagels and croissants. Crackers that contain trans fat. Vegetables Creamed or fried vegetables. Vegetables in a cheese sauce. Regular canned vegetables. Regular canned tomato sauce and paste. Regular tomato and vegetable juices. Fruits Dried fruits. Canned fruit in light or heavy syrup. Fruit juice. Meat and Other Protein Products Fatty cuts of meat.  Ribs, chicken wings, bacon, sausage, bologna, salami, chitterlings, fatback, hot dogs, bratwurst, and packaged luncheon meats. Salted nuts and seeds. Canned beans with  salt. Dairy Whole or 2% milk, cream, half-and-half, and cream cheese. Whole-fat or sweetened yogurt. Full-fat cheeses or blue cheese. Nondairy creamers and whipped toppings. Processed cheese, cheese spreads, or cheese curds. Condiments Onion and garlic salt, seasoned salt, table salt, and sea salt. Canned and packaged gravies. Worcestershire sauce. Tartar sauce. Barbecue sauce. Teriyaki sauce. Soy sauce, including reduced sodium. Steak sauce. Fish sauce. Oyster sauce. Cocktail sauce. Horseradish. Ketchup and mustard. Meat flavorings and tenderizers. Bouillon cubes. Hot sauce. Tabasco sauce. Marinades. Taco seasonings. Relishes. Fats and Oils Butter, stick margarine, lard, shortening, ghee, and bacon fat. Coconut, palm kernel, or palm oils. Regular salad dressings. Other Pickles and olives. Salted popcorn and pretzels. The items listed above may not be a complete list of foods and beverages to avoid. Contact your dietitian for more information. WHERE CAN I FIND MORE INFORMATION? National [employer], [employer], [employer] Institute: travelstabloid.com   This information is not intended to replace advice given to you by your health care provider. Make sure you discuss any questions you have with your health care provider.   Document Released: 06/20/2011 Document Revised: 07/22/2014 Document Reviewed: 05/05/2013 Elsevier Interactive Patient Education [employer].

## 2015-09-15 NOTE — Progress Notes (Signed)
Pre visit review using our clinic review tool, if applicable. No additional management support is needed unless otherwise documented below in the visit note. 

## 2015-09-15 NOTE — Progress Notes (Signed)
    Patient presents to clinic today for follow-up of hypertension after a medication change. Patient had lisinopril 10 mg added to regimen. Endorses good BP with medication but notes a nagging dry cough with meds. Denies URI symptoms. Patient denies chest pain, palpitations, lightheadedness, dizziness, vision changes or frequent headaches.  BP Readings from Last 3 Encounters:  09/15/15 132/75  08/15/15 132/77  04/28/15 132/84    Past Medical History  Diagnosis Date  . History of chicken pox   . Allergy     hay fever  . Hypertension   . OSA (obstructive sleep apnea) 01/26/2012  . Hyperlipidemia     Current Outpatient Prescriptions on File Prior to Visit  Medication Sig Dispense Refill  . aspirin 81 MG tablet Take 81 mg by mouth daily.    Marland Kitchen. atenolol (TENORMIN) 50 MG tablet Take 1 tablet (50 mg total) by mouth daily. 90 tablet 3  . indomethacin (INDOCIN SR) 75 MG CR capsule Take 75 mg by mouth 2 (two) times daily with a meal.    . NON FORMULARY Take 2 capsules by mouth daily. Cherry Extract    . simvastatin (ZOCOR) 10 MG tablet TAKE 1 TABLET BY MOUTH ATBEDTIME. 30 tablet 5   No current facility-administered medications on file prior to visit.    No Known Allergies  Family History  Problem Relation Age of Onset  . Cancer Mother     breast    Social History   Social History  . Marital Status: Married    Spouse Name: N/A  . Number of Children: N/A  . Years of Education: N/A   Social History Main Topics  . Smoking status: Former Smoker -- 1.00 packs/day for 25 years    Types: Cigarettes    Quit date: 06/17/2012  . Smokeless tobacco: None     Comment: cigarette on the weekends  . Alcohol Use: 0.0 oz/week    0 Standard drinks or equivalent per week  . Drug Use: None  . Sexual Activity: Yes   Other Topics Concern  . None   Social History Narrative   married   Review of Systems - See HPI.  All other ROS are negative.  BP 132/75 mmHg  Pulse 62  Temp(Src) 98.3  F (36.8 C) (Oral)  Ht 5\' 10"  (1.778 m)  Wt 317 lb 9.6 oz (144.062 kg)  BMI 45.57 kg/m2  SpO2 98%  Physical Exam  Constitutional: He is oriented to person, place, and time and well-developed, well-nourished, and in no distress.  HENT:  Head: Normocephalic and atraumatic.  Eyes: Conjunctivae are normal.  Cardiovascular: Normal rate, regular rhythm, normal heart sounds and intact distal pulses.   Pulmonary/Chest: Effort normal and breath sounds normal. No respiratory distress. He has no wheezes. He has no rales. He exhibits no tenderness.  Neurological: He is alert and oriented to person, place, and time.  Skin: Skin is warm and dry. No rash noted.  Psychiatric: Affect normal.  Vitals reviewed.   No results found for this or any previous visit (from the past 2160 hour(s)).  Assessment/Plan: HTN (hypertension) BP good on current regimen but patient with cough from ACEI. Will stop lisinopril and begin Losartan 50 mg daily. Continue Atenolol. Follow-up 1 month for BP check with nurse. Follow-up 3 months with me.

## 2015-09-15 NOTE — Assessment & Plan Note (Signed)
BP good on current regimen but patient with cough from ACEI. Will stop lisinopril and begin Losartan 50 mg daily. Continue Atenolol. Follow-up 1 month for BP check with nurse. Follow-up 3 months with me.

## 2015-10-27 ENCOUNTER — Encounter: Payer: 59 | Admitting: Physician Assistant

## 2015-10-30 ENCOUNTER — Telehealth: Payer: Self-pay | Admitting: Behavioral Health

## 2015-10-30 ENCOUNTER — Encounter: Payer: Self-pay | Admitting: Behavioral Health

## 2015-10-30 NOTE — Telephone Encounter (Signed)
Pre-Visit Call completed with patient and chart updated.   Pre-Visit Info documented in Specialty Comments under [employer].    

## 2015-10-31 ENCOUNTER — Encounter: Payer: Self-pay | Admitting: Physician Assistant

## 2015-10-31 ENCOUNTER — Ambulatory Visit (INDEPENDENT_AMBULATORY_CARE_PROVIDER_SITE_OTHER): Payer: 59 | Admitting: Physician Assistant

## 2015-10-31 VITALS — BP 120/88 | HR 70 | Temp 97.7°F | Ht 70.0 in | Wt 318.6 lb

## 2015-10-31 DIAGNOSIS — IMO0002 Reserved for concepts with insufficient information to code with codable children: Secondary | ICD-10-CM

## 2015-10-31 DIAGNOSIS — G4733 Obstructive sleep apnea (adult) (pediatric): Secondary | ICD-10-CM | POA: Diagnosis not present

## 2015-10-31 DIAGNOSIS — Z0000 Encounter for general adult medical examination without abnormal findings: Secondary | ICD-10-CM | POA: Insufficient documentation

## 2015-10-31 DIAGNOSIS — I10 Essential (primary) hypertension: Secondary | ICD-10-CM

## 2015-10-31 DIAGNOSIS — E785 Hyperlipidemia, unspecified: Secondary | ICD-10-CM

## 2015-10-31 DIAGNOSIS — E668 Other obesity: Secondary | ICD-10-CM | POA: Diagnosis not present

## 2015-10-31 LAB — COMPREHENSIVE METABOLIC PANEL
ALK PHOS: 50 U/L (ref 39–117)
ALT: 25 U/L (ref 0–53)
AST: 17 U/L (ref 0–37)
Albumin: 4 g/dL (ref 3.5–5.2)
BILIRUBIN TOTAL: 0.7 mg/dL (ref 0.2–1.2)
BUN: 15 mg/dL (ref 6–23)
CO2: 28 meq/L (ref 19–32)
Calcium: 10.1 mg/dL (ref 8.4–10.5)
Chloride: 106 mEq/L (ref 96–112)
Creatinine, Ser: 0.89 mg/dL (ref 0.40–1.50)
GFR: 98.23 mL/min (ref >60.00)
GLUCOSE: 103 mg/dL — AB (ref 70–99)
Potassium: 4.2 mEq/L (ref 3.5–5.1)
SODIUM: 138 meq/L (ref 135–145)
TOTAL PROTEIN: 7 g/dL (ref 6.0–8.3)

## 2015-10-31 LAB — LIPID PANEL
CHOL/HDL RATIO: 6
Cholesterol: 164 mg/dL (ref 0–200)
HDL: 28.1 mg/dL — ABNORMAL LOW (ref >39.00)
LDL CALC: 108 mg/dL — AB (ref 0–99)
NONHDL: 135.82
TRIGLYCERIDES: 139 mg/dL (ref 0.0–149.0)
VLDL: 27.8 mg/dL (ref 0.0–40.0)

## 2015-10-31 LAB — URINALYSIS, ROUTINE W REFLEX MICROSCOPIC
Bilirubin Urine: NEGATIVE
Hgb urine dipstick: NEGATIVE
KETONES UR: NEGATIVE
Leukocytes, UA: NEGATIVE
Nitrite: NEGATIVE
PH: 5.5 (ref 5.0–8.0)
RBC / HPF: NONE SEEN (ref 0–?)
SPECIFIC GRAVITY, URINE: 1.025 (ref 1.000–1.030)
TOTAL PROTEIN, URINE-UPE24: NEGATIVE
UROBILINOGEN UA: 0.2 (ref 0.0–1.0)
Urine Glucose: NEGATIVE
WBC UA: NONE SEEN (ref 0–?)

## 2015-10-31 LAB — CBC
HEMATOCRIT: 42.6 % (ref 39.0–52.0)
HEMOGLOBIN: 14.6 g/dL (ref 13.0–17.0)
MCHC: 34.3 g/dL (ref 30.0–36.0)
MCV: 83.5 fl (ref 78.0–100.0)
PLATELETS: 263 10*3/uL (ref 150.0–400.0)
RBC: 5.1 Mil/uL (ref 4.22–5.81)
RDW: 13.7 % (ref 11.5–15.5)
WBC: 9.4 10*3/uL (ref 4.0–10.5)

## 2015-10-31 LAB — HEMOGLOBIN A1C: Hgb A1c MFr Bld: 6 % (ref 4.6–6.5)

## 2015-10-31 LAB — TSH: TSH: 2.62 u[IU]/mL (ref 0.35–4.50)

## 2015-10-31 NOTE — Progress Notes (Signed)
Pre visit review using our clinic tool,if applicable. No additional management support is needed unless otherwise documented below in the visit note.  

## 2015-10-31 NOTE — Assessment & Plan Note (Signed)
Depression screen negative. Health Maintenance reviewed -- Immunizations up-to-date. Preventive schedule discussed and handout given in AVS. Will obtain fasting labs today.  

## 2015-10-31 NOTE — Assessment & Plan Note (Signed)
Unable to tolerate his CPAP -- full face mask or nasal pillows. Discussed patient needs to discuss this with his Pulmonologist.

## 2015-10-31 NOTE — Patient Instructions (Signed)
Please go to the lab for blood work.   Our office will call you with your results unless you have chosen to receive results via MyChart.  If your blood work is normal we will follow-up each year for physicals and as scheduled for chronic medical problems.  If anything is abnormal we will treat accordingly and get you in for a follow-up.  Please continue chronic medications as directed.  Please work on increasing aerobic exercise to promote weight loss.   Calorie Counting for Weight Loss Calories are energy you get from the things you eat and drink. Your body uses this energy to keep you going throughout the day. The number of calories you eat affects your weight. When you eat more calories than your body needs, your body stores the extra calories as fat. When you eat fewer calories than your body needs, your body burns fat to get the energy it needs. Calorie counting means keeping track of how many calories you eat and drink each day. If you make sure to eat fewer calories than your body needs, you should lose weight. In order for calorie counting to work, you will need to eat the number of calories that are right for you in a day to lose a healthy amount of weight per week. A healthy amount of weight to lose per week is usually 1-2 lb (0.5-0.9 kg). A dietitian can determine how many calories you need in a day and give you suggestions on how to reach your calorie goal.   WHAT DO I NEED TO KNOW ABOUT CALORIE COUNTING? In order to meet your daily calorie goal, you will need to:  Find out how many calories are in each food you would like to eat. Try to do this before you eat.  Decide how much of the food you can eat.  Write down what you ate and how many calories it had. Doing this is called keeping a food log. WHERE DO I FIND CALORIE INFORMATION? The number of calories in a food can be found on a Nutrition Facts label. Note that all the information on a label is based on a specific serving of  the food. If a food does not have a Nutrition Facts label, try to look up the calories online or ask your dietitian for help. HOW DO I DECIDE HOW MUCH TO EAT? To decide how much of the food you can eat, you will need to consider both the number of calories in one serving and the size of one serving. This information can be found on the Nutrition Facts label. If a food does not have a Nutrition Facts label, look up the information online or ask your dietitian for help. Remember that calories are listed per serving. If you choose to have more than one serving of a food, you will have to multiply the calories per serving by the amount of servings you plan to eat. For example, the label on a package of bread might say that a serving size is 1 slice and that there are 90 calories in a serving. If you eat 1 slice, you will have eaten 90 calories. If you eat 2 slices, you will have eaten 180 calories. HOW DO I KEEP A FOOD LOG? After each meal, record the following information in your food log:  What you ate.  How much of it you ate.  How many calories it had.  Then, add up your calories. Keep your food log near you, such as  in a small notebook in your pocket. Another option is to use a mobile app or website. Some programs will calculate calories for you and show you how many calories you have left each time you add an item to the log. WHAT ARE SOME CALORIE COUNTING TIPS?  Use your calories on foods and drinks that will fill you up and not leave you hungry. Some examples of this include foods like nuts and nut butters, vegetables, lean proteins, and high-fiber foods (more than 5 g fiber per serving).  Eat nutritious foods and avoid empty calories. Empty calories are calories you get from foods or beverages that do not have many nutrients, such as candy and soda. It is better to have a nutritious high-calorie food (such as an avocado) than a food with few nutrients (such as a bag of chips).  Know how  many calories are in the foods you eat most often. This way, you do not have to look up how many calories they have each time you eat them.  Look out for foods that may seem like low-calorie foods but are really high-calorie foods, such as baked goods, soda, and fat-free candy.  Pay attention to calories in drinks. Drinks such as sodas, specialty coffee drinks, alcohol, and juices have a lot of calories yet do not fill you up. Choose low-calorie drinks like water and diet drinks.  Focus your calorie counting efforts on higher calorie items. Logging the calories in a garden salad that contains only vegetables is less important than calculating the calories in a milk shake.  Find a way of tracking calories that works for you. Get creative. Most people who are successful find ways to keep track of how much they eat in a day, even if they do not count every calorie. WHAT ARE SOME PORTION CONTROL TIPS?  Know how many calories are in a serving. This will help you know how many servings of a certain food you can have.  Use a measuring cup to measure serving sizes. This is helpful when you start out. With time, you will be able to estimate serving sizes for some foods.  Take some time to put servings of different foods on your favorite plates, bowls, and cups so you know what a serving looks like.  Try not to eat straight from a bag or box. Doing this can lead to overeating. Put the amount you would like to eat in a cup or on a plate to make sure you are eating the right portion.  Use smaller plates, glasses, and bowls to prevent overeating. This is a quick and easy way to practice portion control. If your plate is smaller, less food can fit on it.  Try not to multitask while eating, such as watching TV or using your computer. If it is time to eat, sit down at a table and enjoy your food. Doing this will help you to start recognizing when you are full. It will also make you more aware of what and how  much you are eating. HOW CAN I CALORIE COUNT WHEN EATING OUT?  Ask for smaller portion sizes or child-sized portions.  Consider sharing an entree and sides instead of getting your own entree.  If you get your own entree, eat only half. Ask for a box at the beginning of your meal and put the rest of your entree in it so you are not tempted to eat it.  Look for the calories on the menu. If calories  are listed, choose the lower calorie options.  Choose dishes that include vegetables, fruits, whole grains, low-fat dairy products, and lean protein. Focusing on smart food choices from each of the 5 food groups can help you stay on track at restaurants.  Choose items that are boiled, broiled, grilled, or steamed.  Choose water, milk, unsweetened iced tea, or other drinks without added sugars. If you want an alcoholic beverage, choose a lower calorie option. For example, a regular margarita can have up to 700 calories and a glass of wine has around 150.  Stay away from items that are buttered, battered, fried, or served with cream sauce. Items labeled "crispy" are usually fried, unless stated otherwise.  Ask for dressings, sauces, and syrups on the side. These are usually very high in calories, so do not eat much of them.  Watch out for salads. Many people think salads are a healthy option, but this is often not the case. Many salads come with bacon, fried chicken, lots of cheese, fried chips, and dressing. All of these items have a lot of calories. If you want a salad, choose a garden salad and ask for grilled meats or steak. Ask for the dressing on the side, or ask for olive oil and vinegar or lemon to use as dressing.  Estimate how many servings of a food you are given. For example, a serving of cooked rice is  cup or about the size of half a tennis ball or one cupcake wrapper. Knowing serving sizes will help you be aware of how much food you are eating at restaurants. The list below tells you how  big or small some common portion sizes are based on everyday objects.  1 oz--4 stacked dice.  3 oz--1 deck of cards.  1 tsp--1 dice.  1 Tbsp-- a Ping-Pong ball.  2 Tbsp--1 Ping-Pong ball.   cup--1 tennis ball or 1 cupcake wrapper.  1 cup--1 baseball.   This information is not intended to replace advice given to you by your health care provider. Make sure you discuss any questions you have with your health care provider.   Document Released: 07/01/2005 Document Revised: 07/22/2014 Document Reviewed: 05/06/2013 Elsevier Interactive Patient Education 2016 [place] for Adults, Male A healthy lifestyle and preventive care can promote health and wellness. Preventive health guidelines for men include the following key practices:  A routine yearly physical is a good way to check with your health care provider about your health and preventative screening. It is a chance to share any concerns and updates on your health and to receive a thorough exam.  Visit your dentist for a routine exam and preventative care every 6 months. Brush your teeth twice a day and floss once a day. Good oral hygiene prevents tooth decay and gum disease.  The frequency of eye exams is based on your age, health, family medical history, use of contact lenses, and other factors. Follow your health care provider's recommendations for frequency of eye exams.  Eat a healthy diet. Foods such as vegetables, fruits, whole grains, low-fat dairy products, and lean protein foods contain the nutrients you need without too many calories. Decrease your intake of foods high in solid fats, added sugars, and salt. Eat the right amount of calories for you.Get information about a proper diet from your health care provider, if necessary.  Regular physical exercise is one of the most important things you can do for your health. Most adults should get at least 150  minutes of moderate-intensity exercise (any  activity that increases your heart rate and causes you to sweat) each week. In addition, most adults need muscle-strengthening exercises on 2 or more days a week.  Maintain a healthy weight. The body mass index (BMI) is a screening tool to identify possible weight problems. It provides an estimate of body fat based on height and weight. Your health care provider can find your BMI and can help you achieve or maintain a healthy weight.For adults 20 years and older:  A BMI below 18.5 is considered underweight.  A BMI of 18.5 to 24.9 is normal.  A BMI of 25 to 29.9 is considered overweight.  A BMI of 30 and above is considered obese.  Maintain normal blood lipids and cholesterol levels by exercising and minimizing your intake of saturated fat. Eat a balanced diet with plenty of fruit and vegetables. Blood tests for lipids and cholesterol should begin at age 1 and be repeated every 5 years. If your lipid or cholesterol levels are high, you are over 50, or you are at high risk for heart disease, you may need your cholesterol levels checked more frequently.Ongoing high lipid and cholesterol levels should be treated with medicines if diet and exercise are not working.  If you smoke, find out from your health care provider how to quit. If you do not use tobacco, do not start.  Lung cancer screening is recommended for adults aged 47-80 years who are at high risk for developing lung cancer because of a history of smoking. A yearly low-dose CT scan of the lungs is recommended for people who have at least a 30-pack-year history of smoking and are a current smoker or have quit within the past 15 years. A pack year of smoking is smoking an average of 1 pack of cigarettes a day for 1 year (for example: 1 pack a day for 30 years or 2 packs a day for 15 years). Yearly screening should continue until the smoker has stopped smoking for at least 15 years. Yearly screening should be stopped for people who develop a  health problem that would prevent them from having lung cancer treatment.  If you choose to drink alcohol, do not have more than 2 drinks per day. One drink is considered to be 12 ounces (355 mL) of beer, 5 ounces (148 mL) of wine, or 1.5 ounces (44 mL) of liquor.  Avoid use of street drugs. Do not share needles with anyone. Ask for help if you need support or instructions about stopping the use of drugs.  High blood pressure causes heart disease and increases the risk of stroke. Your blood pressure should be checked at least every 1-2 years. Ongoing high blood pressure should be treated with medicines, if weight loss and exercise are not effective.  If you are 66-37 years old, ask your health care provider if you should take aspirin to prevent heart disease.  Diabetes screening is done by taking a blood sample to check your blood glucose level after you have not eaten for a certain period of time (fasting). If you are not overweight and you do not have risk factors for diabetes, you should be screened once every 3 years starting at age 24. If you are overweight or obese and you are 65-38 years of age, you should be screened for diabetes every year as part of your cardiovascular risk assessment.  Colorectal cancer can be detected and often prevented. Most routine colorectal cancer screening begins at the  age of 70 and continues through age 76. However, your health care provider may recommend screening at an earlier age if you have risk factors for colon cancer. On a yearly basis, your health care provider may provide home test kits to check for hidden blood in the stool. Use of a small camera at the end of a tube to directly examine the colon (sigmoidoscopy or colonoscopy) can detect the earliest forms of colorectal cancer. Talk to your health care provider about this at age 53, when routine screening begins. Direct exam of the colon should be repeated every 5-10 years through age 48, unless early forms  of precancerous polyps or small growths are found.  People who are at an increased risk for hepatitis B should be screened for this virus. You are considered at high risk for hepatitis B if:  You were born in a country where hepatitis B occurs often. Talk with your health care provider about which countries are considered high risk.  Your parents were born in a high-risk country and you have not received a shot to protect against hepatitis B (hepatitis B vaccine).  You have HIV or AIDS.  You use needles to inject street drugs.  You live with, or have sex with, someone who has hepatitis B.  You are a man who has sex with other men (MSM).  You get hemodialysis treatment.  You take certain medicines for conditions such as cancer, organ transplantation, and autoimmune conditions.  Hepatitis C blood testing is recommended for all people born from [birth year] through 1965 and any individual with known risks for hepatitis C.  Practice safe sex. Use condoms and avoid high-risk sexual practices to reduce the spread of sexually transmitted infections (STIs). STIs include gonorrhea, chlamydia, syphilis, trichomonas, herpes, HPV, and human immunodeficiency virus (HIV). Herpes, HIV, and HPV are viral illnesses that have no cure. They can result in disability, cancer, and death.  If you are a man who has sex with other men, you should be screened at least once per year for:  HIV.  Urethral, rectal, and pharyngeal infection of gonorrhea, chlamydia, or both.  If you are at risk of being infected with HIV, it is recommended that you take a prescription medicine daily to prevent HIV infection. This is called preexposure prophylaxis (PrEP). You are considered at risk if:  You are a man who has sex with other men (MSM) and have other risk factors.  You are a heterosexual man, are sexually active, and are at increased risk for HIV infection.  You take drugs by injection.  You are sexually active with a  partner who has HIV.  Talk with your health care provider about whether you are at high risk of being infected with HIV. If you choose to begin PrEP, you should first be tested for HIV. You should then be tested every 3 months for as long as you are taking PrEP.  A one-time screening for abdominal aortic aneurysm (AAA) and surgical repair of large AAAs by ultrasound are recommended for men ages 56 to 75 years who are current or former smokers.  Healthy men should no longer receive prostate-specific antigen (PSA) blood tests as part of routine cancer screening. Talk with your health care provider about prostate cancer screening.  Testicular cancer screening is not recommended for adult males who have no symptoms. Screening includes self-exam, a health care provider exam, and other screening tests. Consult with your health care provider about any symptoms you have or any concerns  you have about testicular cancer.  Use sunscreen. Apply sunscreen liberally and repeatedly throughout the day. You should seek shade when your shadow is shorter than you. Protect yourself by wearing long sleeves, pants, a wide-brimmed hat, and sunglasses year round, whenever you are outdoors.  Once a month, do a whole-body skin exam, using a mirror to look at the skin on your back. Tell your health care provider about new moles, moles that have irregular borders, moles that are larger than a pencil eraser, or moles that have changed in shape or color.  Stay current with required vaccines (immunizations).  Influenza vaccine. All adults should be immunized every year.  Tetanus, diphtheria, and acellular pertussis (Td, Tdap) vaccine. An adult who has not previously received Tdap or who does not know his vaccine status should receive 1 dose of Tdap. This initial dose should be followed by tetanus and diphtheria toxoids (Td) booster doses every 10 years. Adults with an unknown or incomplete history of completing a 3-dose  immunization series with Td-containing vaccines should begin or complete a primary immunization series including a Tdap dose. Adults should receive a Td booster every 10 years.  Varicella vaccine. An adult without evidence of immunity to varicella should receive 2 doses or a second dose if he has previously received 1 dose.  Human papillomavirus (HPV) vaccine. Males aged 11-21 years who have not received the vaccine previously should receive the 3-dose series. Males aged 22-26 years may be immunized. Immunization is recommended through the age of 58 years for any male who has sex with males and did not get any or all doses earlier. Immunization is recommended for any person with an immunocompromised condition through the age of 75 years if he did not get any or all doses earlier. During the 3-dose series, the second dose should be obtained 4-8 weeks after the first dose. The third dose should be obtained 24 weeks after the first dose and 16 weeks after the second dose.  Zoster vaccine. One dose is recommended for adults aged 91 years or older unless certain conditions are present.  Measles, mumps, and rubella (MMR) vaccine. Adults born before [birth year] generally are considered immune to measles and mumps. Adults born in [birth year] or later should have 1 or more doses of MMR vaccine unless there is a contraindication to the vaccine or there is laboratory evidence of immunity to each of the three diseases. A routine second dose of MMR vaccine should be obtained at least 28 days after the first dose for students attending postsecondary schools, health care workers, or international travelers. People who received inactivated measles vaccine or an unknown type of measles vaccine during 1963-1967 should receive 2 doses of MMR vaccine. People who received inactivated mumps vaccine or an unknown type of mumps vaccine before 1979 and are at high risk for mumps infection should consider immunization with 2 doses of MMR vaccine.  Unvaccinated health care workers born before [birth year] who lack laboratory evidence of measles, mumps, or rubella immunity or laboratory confirmation of disease should consider measles and mumps immunization with 2 doses of MMR vaccine or rubella immunization with 1 dose of MMR vaccine.  Pneumococcal 13-valent conjugate (PCV13) vaccine. When indicated, a person who is uncertain of his immunization history and has no record of immunization should receive the PCV13 vaccine. All adults 59 years of age and older should receive this vaccine. An adult aged 21 years or older who has certain medical conditions and has not been previously immunized should receive  1 dose of PCV13 vaccine. This PCV13 should be followed with a dose of pneumococcal polysaccharide (PPSV23) vaccine. Adults who are at high risk for pneumococcal disease should obtain the PPSV23 vaccine at least 8 weeks after the dose of PCV13 vaccine. Adults older than 45 years of age who have normal immune system function should obtain the PPSV23 vaccine dose at least 1 year after the dose of PCV13 vaccine.  Pneumococcal polysaccharide (PPSV23) vaccine. When PCV13 is also indicated, PCV13 should be obtained first. All adults aged 26 years and older should be immunized. An adult younger than age 10 years who has certain medical conditions should be immunized. Any person who resides in a nursing home or long-term care facility should be immunized. An adult smoker should be immunized. People with an immunocompromised condition and certain other conditions should receive both PCV13 and PPSV23 vaccines. People with human immunodeficiency virus (HIV) infection should be immunized as soon as possible after diagnosis. Immunization during chemotherapy or radiation therapy should be avoided. Routine use of PPSV23 vaccine is not recommended for American Indians, [place] Natives, or people younger than 65 years unless there are medical conditions that require PPSV23 vaccine.  When indicated, people who have unknown immunization and have no record of immunization should receive PPSV23 vaccine. One-time revaccination 5 years after the first dose of PPSV23 is recommended for people aged 19-64 years who have chronic kidney failure, nephrotic syndrome, asplenia, or immunocompromised conditions. People who received 1-2 doses of PPSV23 before age 16 years should receive another dose of PPSV23 vaccine at age 61 years or later if at least 5 years have passed since the previous dose. Doses of PPSV23 are not needed for people immunized with PPSV23 at or after age 41 years.  Meningococcal vaccine. Adults with asplenia or persistent complement component deficiencies should receive 2 doses of quadrivalent meningococcal conjugate (MenACWY-D) vaccine. The doses should be obtained at least 2 months apart. Microbiologists working with certain meningococcal bacteria, [place] recruits, people at risk during an outbreak, and people who travel to or live in countries with a high rate of meningitis should be immunized. A first-year college student up through age 35 years who is living in a residence hall should receive a dose if he did not receive a dose on or after his 16th birthday. Adults who have certain high-risk conditions should receive one or more doses of vaccine.  Hepatitis A vaccine. Adults who wish to be protected from this disease, have chronic liver disease, work with hepatitis A-infected animals, work in hepatitis A research labs, or travel to or work in countries with a high rate of hepatitis A should be immunized. Adults who were previously unvaccinated and who anticipate close contact with an international adoptee during the first 60 days after arrival in the [place] from a country with a high rate of hepatitis A should be immunized.  Hepatitis B vaccine. Adults should be immunized if they wish to be protected from this disease, are under age 78 years and have diabetes, have  chronic liver disease, have had more than one sex partner in the past 6 months, may be exposed to blood or other infectious body fluids, are household contacts or sex partners of hepatitis B positive people, are clients or workers in certain care facilities, or travel to or work in countries with a high rate of hepatitis B.  Haemophilus influenzae type b (Hib) vaccine. A previously unvaccinated person with asplenia or sickle cell disease or having a scheduled splenectomy should  receive 1 dose of Hib vaccine. Regardless of previous immunization, a recipient of a hematopoietic stem cell transplant should receive a 3-dose series 6-12 months after his successful transplant. Hib vaccine is not recommended for adults with HIV infection. Preventive Service / Frequency Ages 42 to 29  Blood pressure check.** / Every 3-5 years.  Lipid and cholesterol check.** / Every 5 years beginning at age 33.  Hepatitis C blood test.** / For any individual with known risks for hepatitis C.  Skin self-exam. / Monthly.  Influenza vaccine. / Every year.  Tetanus, diphtheria, and acellular pertussis (Tdap, Td) vaccine.** / Consult your health care provider. 1 dose of Td every 10 years.  Varicella vaccine.** / Consult your health care provider.  HPV vaccine. / 3 doses over 6 months, if 72 or younger.  Measles, mumps, rubella (MMR) vaccine.** / You need at least 1 dose of MMR if you were born in 1957 or later. You may also need a second dose.  Pneumococcal 13-valent conjugate (PCV13) vaccine.** / Consult your health care provider.  Pneumococcal polysaccharide (PPSV23) vaccine.** / 1 to 2 doses if you smoke cigarettes or if you have certain conditions.  Meningococcal vaccine.** / 1 dose if you are age 28 to 16 years and a Market researcher living in a residence hall, or have one of several medical conditions. You may also need additional booster doses.  Hepatitis A vaccine.** / Consult your health care  provider.  Hepatitis B vaccine.** / Consult your health care provider.  Haemophilus influenzae type b (Hib) vaccine.** / Consult your health care provider. Ages 4 to 86  Blood pressure check.** / Every year.  Lipid and cholesterol check.** / Every 5 years beginning at age 40.  Lung cancer screening. / Every year if you are aged 35-80 years and have a 30-pack-year history of smoking and currently smoke or have quit within the past 15 years. Yearly screening is stopped once you have quit smoking for at least 15 years or develop a health problem that would prevent you from having lung cancer treatment.  Fecal occult blood test (FOBT) of stool. / Every year beginning at age 34 and continuing until age 19. You may not have to do this test if you get a colonoscopy every 10 years.  Flexible sigmoidoscopy** or colonoscopy.** / Every 5 years for a flexible sigmoidoscopy or every 10 years for a colonoscopy beginning at age 63 and continuing until age 64.  Hepatitis C blood test.** / For all people born from [birth year] through 1965 and any individual with known risks for hepatitis C.  Skin self-exam. / Monthly.  Influenza vaccine. / Every year.  Tetanus, diphtheria, and acellular pertussis (Tdap/Td) vaccine.** / Consult your health care provider. 1 dose of Td every 10 years.  Varicella vaccine.** / Consult your health care provider.  Zoster vaccine.** / 1 dose for adults aged 66 years or older.  Measles, mumps, rubella (MMR) vaccine.** / You need at least 1 dose of MMR if you were born in 1957 or later. You may also need a second dose.  Pneumococcal 13-valent conjugate (PCV13) vaccine.** / Consult your health care provider.  Pneumococcal polysaccharide (PPSV23) vaccine.** / 1 to 2 doses if you smoke cigarettes or if you have certain conditions.  Meningococcal vaccine.** / Consult your health care provider.  Hepatitis A vaccine.** / Consult your health care provider.  Hepatitis B vaccine.** /  Consult your health care provider.  Haemophilus influenzae type b (Hib) vaccine.** / Consult your health  care provider. Ages 24 and over  Blood pressure check.** / Every year.  Lipid and cholesterol check.**/ Every 5 years beginning at age 65.  Lung cancer screening. / Every year if you are aged 82-80 years and have a 30-pack-year history of smoking and currently smoke or have quit within the past 15 years. Yearly screening is stopped once you have quit smoking for at least 15 years or develop a health problem that would prevent you from having lung cancer treatment.  Fecal occult blood test (FOBT) of stool. / Every year beginning at age 38 and continuing until age 59. You may not have to do this test if you get a colonoscopy every 10 years.  Flexible sigmoidoscopy** or colonoscopy.** / Every 5 years for a flexible sigmoidoscopy or every 10 years for a colonoscopy beginning at age 17 and continuing until age 58.  Hepatitis C blood test.** / For all people born from [birth year] through 1965 and any individual with known risks for hepatitis C.  Abdominal aortic aneurysm (AAA) screening.** / A one-time screening for ages 41 to 65 years who are current or former smokers.  Skin self-exam. / Monthly.  Influenza vaccine. / Every year.  Tetanus, diphtheria, and acellular pertussis (Tdap/Td) vaccine.** / 1 dose of Td every 10 years.  Varicella vaccine.** / Consult your health care provider.  Zoster vaccine.** / 1 dose for adults aged 36 years or older.  Pneumococcal 13-valent conjugate (PCV13) vaccine.** / 1 dose for all adults aged 11 years and older.  Pneumococcal polysaccharide (PPSV23) vaccine.** / 1 dose for all adults aged 30 years and older.  Meningococcal vaccine.** / Consult your health care provider.  Hepatitis A vaccine.** / Consult your health care provider.  Hepatitis B vaccine.** / Consult your health care provider.  Haemophilus influenzae type b (Hib) vaccine.** / Consult your  health care provider. **Family history and personal history of risk and conditions may change your health care provider's recommendations.   This information is not intended to replace advice given to you by your health care provider. Make sure you discuss any questions you have with your health care provider.   Document Released: 08/27/2001 Document Revised: 07/22/2014 Document Reviewed: 11/26/2010 Elsevier Interactive Patient Education [employer].

## 2015-10-31 NOTE — Assessment & Plan Note (Signed)
Well-controlled. Continue current regimen. Will check CMP today. Will repeat Lipid panel.

## 2015-10-31 NOTE — Progress Notes (Signed)
Patient presents to clinic today for annual exam.  Patient is fasting for labs.  Acute Concerns: Denies acute concerns today. Body mass index is 45.71 kg/(m^2). Is currently working on diet and exercise regimen. Endorses this is hard due to work schedule but does stay very active at work.   Chronic Issues: Hypertension -- Is currently on Losartan 50 mg and Atenolol 50 mg for BP. Endorses taking as directed. Patient denies chest pain, palpitations, lightheadedness, dizziness, vision changes or frequent headaches.  BP Readings from Last 3 Encounters:  10/31/15 120/88  09/15/15 132/75  08/15/15 132/77   OSA -- Is unable to tolerate CPAP (facial mask or nasal pillows). Has follow-up scheduled. Endorses he is sleeping well if he sleeps on his side.   Hyperlipidemia -- Endorses taking simvastatin 10 mg daily. Endorses taking as directed along with 81 mg daily.   Health Maintenance: Immunizations -- up-to-date  Past Medical History  Diagnosis Date  . History of chicken pox   . Allergy     hay fever  . Hypertension   . OSA (obstructive sleep apnea) 01/26/2012  . Hyperlipidemia     Past Surgical History  Procedure Laterality Date  . Irrigation and debridement sebaceous cyst  2009     on back and neck  . Carpal tunnel release Right 05/04/13    Current Outpatient Prescriptions on File Prior to Visit  Medication Sig Dispense Refill  . aspirin 81 MG tablet Take 81 mg by mouth daily.    Marland Kitchen. atenolol (TENORMIN) 50 MG tablet Take 1 tablet (50 mg total) by mouth daily. 90 tablet 3  . losartan (COZAAR) 50 MG tablet Take 1 tablet (50 mg total) by mouth daily. 30 tablet 1  . simvastatin (ZOCOR) 10 MG tablet TAKE 1 TABLET BY MOUTH ATBEDTIME. 30 tablet 5  . indomethacin (INDOCIN SR) 75 MG CR capsule Take 75 mg by mouth 2 (two) times daily with a meal. Reported on 10/31/2015     No current facility-administered medications on file prior to visit.    No Known Allergies  Family History    Problem Relation Age of Onset  . Cancer Mother     breast    Social History   Social History  . Marital Status: Married    Spouse Name: N/A  . Number of Children: N/A  . Years of Education: N/A   Occupational History  . Not on file.   Social History Main Topics  . Smoking status: Former Smoker -- 1.00 packs/day for 25 years    Types: Cigarettes    Quit date: 06/17/2012  . Smokeless tobacco: Not on file     Comment: cigarette on the weekends  . Alcohol Use: 0.0 oz/week    0 Standard drinks or equivalent per week  . Drug Use: Not on file  . Sexual Activity: Yes   Other Topics Concern  . Not on file   Social History Narrative   married   Review of Systems  Constitutional: Negative for fever and weight loss.  HENT: Negative for ear discharge, ear pain, hearing loss and tinnitus.   Eyes: Negative for blurred vision, double vision, photophobia and pain.  Respiratory: Negative for cough and shortness of breath.   Cardiovascular: Negative for chest pain and palpitations.  Gastrointestinal: Negative for heartburn, nausea, vomiting, abdominal pain, diarrhea, constipation, blood in stool and melena.  Genitourinary: Negative for dysuria, urgency, frequency, hematuria and flank pain.  Musculoskeletal: Negative for falls.  Neurological: Negative for dizziness, loss  of consciousness and headaches.  Endo/Heme/Allergies: Negative for environmental allergies.  Psychiatric/Behavioral: Negative for depression, suicidal ideas, hallucinations and substance abuse. The patient is not nervous/anxious and does not have insomnia.    BP 120/88 mmHg  Pulse 70  Temp(Src) 97.7 F (36.5 C) (Oral)  Ht  (1.778 m)  Wt 318 lb 9.6 oz (144.516 kg)  BMI 45.71 kg/m2  SpO2 97%  Physical Exam  Constitutional: He is oriented to person, place, and time and well-developed, well-nourished, and in no distress.  HENT:  Head: Normocephalic and atraumatic.  Right Ear: External ear normal.  Left  Ear: External ear normal.  Nose: Nose normal.  Mouth/Throat: Oropharynx is clear and moist. No oropharyngeal exudate.  Eyes: Conjunctivae and EOM are normal. Pupils are equal, round, and reactive to light.  Neck: Neck supple. No thyromegaly present.  Cardiovascular: Normal rate, regular rhythm, normal heart sounds and intact distal pulses.   Pulmonary/Chest: Effort normal and breath sounds normal. No respiratory distress. He has no wheezes. He has no rales. He exhibits no tenderness.  Abdominal: Soft. Bowel sounds are normal. He exhibits no distension and no mass. There is no tenderness. There is no rebound and no guarding.  Genitourinary: Testes/scrotum normal and penis normal. No discharge found.  Lymphadenopathy:    He has no cervical adenopathy.  Neurological: He is alert and oriented to person, place, and time.  Skin: Skin is warm and dry. No rash noted.  Psychiatric: Affect normal.  Vitals reviewed.  Assessment/Plan: Visit for preventive health examination Depression screen negative. Health Maintenance reviewed -- Immunizations up-to-date. Preventive schedule discussed and handout given in AVS. Will obtain fasting labs today.   OSA (obstructive sleep apnea) Unable to tolerate his CPAP -- full face mask or nasal pillows. Discussed patient needs to discuss this with his Pulmonologist.   Adult BMI 30+ Body mass index is 45.71 kg/(m^2). Discussed aerobic exercise recommendations -- 150 minutes per week. Will start slow and work up to this.  Calorie counting reviewed. Handout given. Will continue to monitor.   HTN (hypertension) Well-controlled. Continue current regimen. Will check CMP today. Will repeat Lipid panel.

## 2015-10-31 NOTE — Assessment & Plan Note (Signed)
Body mass index is 45.71 kg/(m^2). Discussed aerobic exercise recommendations -- 150 minutes per week. Will start slow and work up to this.  Calorie counting reviewed. Handout given. Will continue to monitor.

## 2015-11-13 ENCOUNTER — Other Ambulatory Visit: Payer: Self-pay | Admitting: Physician Assistant

## 2015-11-13 NOTE — Telephone Encounter (Signed)
Rx request to pharmacy/SLS  

## 2016-01-11 ENCOUNTER — Other Ambulatory Visit: Payer: Self-pay | Admitting: Physician Assistant

## 2016-01-11 NOTE — Telephone Encounter (Signed)
Medication filled to pharmacy as requested.   

## 2016-02-07 ENCOUNTER — Other Ambulatory Visit: Payer: Self-pay | Admitting: Physician Assistant

## 2016-07-10 ENCOUNTER — Other Ambulatory Visit: Payer: Self-pay | Admitting: Physician Assistant

## 2016-07-12 ENCOUNTER — Telehealth: Payer: Self-pay | Admitting: Physician Assistant

## 2016-07-12 DIAGNOSIS — I10 Essential (primary) hypertension: Secondary | ICD-10-CM

## 2016-07-12 MED ORDER — ATENOLOL 50 MG PO TABS: 50.0000 mg | ORAL_TABLET | Freq: Every day | ORAL | 0 refills | Status: DC

## 2016-07-12 NOTE — Telephone Encounter (Signed)
This medication was filled for #30 with 0 refills, please call pt and schedule a blood pressure follow up with Selena Battenody as he is overdue.
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
## 2016-07-12 NOTE — Telephone Encounter (Signed)
Called & Scheduled

## 2016-08-01 ENCOUNTER — Encounter: Payer: 59 | Admitting: Physician Assistant

## 2016-08-06 ENCOUNTER — Other Ambulatory Visit: Payer: Self-pay | Admitting: Physician Assistant

## 2016-08-06 ENCOUNTER — Telehealth: Payer: Self-pay | Admitting: Physician Assistant

## 2016-08-06 DIAGNOSIS — I10 Essential (primary) hypertension: Secondary | ICD-10-CM

## 2016-08-06 NOTE — Telephone Encounter (Signed)
Patient is scheduled to come in for appt on Friday, 08/09/16 for his cpe.  However, he will run out of meds before them.  [facility]  Per wife, please do not deny requests as patient will show up for his appt on Friday as scheduled.

## 2016-08-07 NOTE — Telephone Encounter (Signed)
Sent 30 of the Atenolol, Losartan and Simvastatin to the pharmacy until patient appointment

## 2016-08-09 ENCOUNTER — Encounter: Payer: 59 | Admitting: Physician Assistant

## 2016-08-12 ENCOUNTER — Encounter: Payer: Self-pay | Admitting: Physician Assistant

## 2016-08-12 ENCOUNTER — Ambulatory Visit (INDEPENDENT_AMBULATORY_CARE_PROVIDER_SITE_OTHER): Payer: 59 | Admitting: Physician Assistant

## 2016-08-12 VITALS — BP 126/84 | HR 68 | Temp 98.1°F | Resp 16 | Ht 70.0 in | Wt 312.0 lb

## 2016-08-12 DIAGNOSIS — Z23 Encounter for immunization: Secondary | ICD-10-CM | POA: Diagnosis not present

## 2016-08-12 DIAGNOSIS — Z0000 Encounter for general adult medical examination without abnormal findings: Secondary | ICD-10-CM

## 2016-08-12 DIAGNOSIS — E785 Hyperlipidemia, unspecified: Secondary | ICD-10-CM

## 2016-08-12 DIAGNOSIS — I10 Essential (primary) hypertension: Secondary | ICD-10-CM | POA: Diagnosis not present

## 2016-08-12 LAB — CBC
HCT: 47.4 % (ref 39.0–52.0)
HEMOGLOBIN: 16 g/dL (ref 13.0–17.0)
MCHC: 33.8 g/dL (ref 30.0–36.0)
MCV: 86.5 fl (ref 78.0–100.0)
Platelets: 284 10*3/uL (ref 150.0–400.0)
RBC: 5.48 Mil/uL (ref 4.22–5.81)
RDW: 14 % (ref 11.5–15.5)
WBC: 8.9 10*3/uL (ref 4.0–10.5)

## 2016-08-12 LAB — HEMOGLOBIN A1C: Hgb A1c MFr Bld: 5.8 % (ref 4.6–6.5)

## 2016-08-12 LAB — LIPID PANEL
CHOLESTEROL: 199 mg/dL (ref 0–200)
HDL: 34.6 mg/dL — ABNORMAL LOW (ref >39.00)
LDL CALC: 131 mg/dL — AB (ref 0–99)
NonHDL: 164.48
TRIGLYCERIDES: 167 mg/dL — AB (ref 0.0–149.0)
Total CHOL/HDL Ratio: 6
VLDL: 33.4 mg/dL (ref 0.0–40.0)

## 2016-08-12 LAB — COMPREHENSIVE METABOLIC PANEL
ALK PHOS: 58 U/L (ref 39–117)
ALT: 25 U/L (ref 0–53)
AST: 16 U/L (ref 0–37)
Albumin: 4.5 g/dL (ref 3.5–5.2)
BILIRUBIN TOTAL: 0.5 mg/dL (ref 0.2–1.2)
BUN: 16 mg/dL (ref 6–23)
CO2: 25 mEq/L (ref 19–32)
Calcium: 10.5 mg/dL (ref 8.4–10.5)
Chloride: 107 mEq/L (ref 96–112)
Creatinine, Ser: 0.85 mg/dL (ref 0.40–1.50)
GFR: 103.22 mL/min (ref >60.00)
GLUCOSE: 110 mg/dL — AB (ref 70–99)
Potassium: 4.6 mEq/L (ref 3.5–5.1)
SODIUM: 137 meq/L (ref 135–145)
TOTAL PROTEIN: 6.9 g/dL (ref 6.0–8.3)

## 2016-08-12 LAB — URINALYSIS, ROUTINE W REFLEX MICROSCOPIC
Bilirubin Urine: NEGATIVE
Ketones, ur: NEGATIVE
Leukocytes, UA: NEGATIVE
Nitrite: NEGATIVE
RBC / HPF: NONE SEEN (ref 0–?)
SPECIFIC GRAVITY, URINE: 1.015 (ref 1.000–1.030)
Total Protein, Urine: NEGATIVE
Urine Glucose: NEGATIVE
Urobilinogen, UA: 0.2 (ref 0.0–1.0)
pH: 5.5 (ref 5.0–8.0)

## 2016-08-12 LAB — TSH: TSH: 2.53 u[IU]/mL (ref 0.35–4.50)

## 2016-08-12 NOTE — Progress Notes (Signed)
Patient presents to clinic today for annual exam.  Patient is fasting for labs.   Chronic Issues: Hypertension -- Is currently on Losartan 50 mg and Atenolol 50 mg daily. Is taking as directed. Patient denies chest pain, palpitations, lightheadedness, dizziness, vision changes or frequent headaches.  BP Readings from Last 3 Encounters:  08/12/16 126/84  10/31/15 120/88  09/15/15 132/75   Hyperlipidemia -- Is currently on Simvastatin 10 mg daily. Is also taking 81 mg ASA. Body mass index is 44.77 kg/m. Is staying very active at work. Has no other regular exercise at the moment.   OSA -- Followed by Pulmonology. Currently on CPAP therapy. Using sometimes.   Tobacco Use -- Off and on smoker. Has recently restarted over the past few months. Is smoking about 1/4 ppd. Denies desire to quit at present.   Health Maintenance: Immunizations --Agrees to flu shot today. Tetanus up-to-date.   Past Medical History:  Diagnosis Date  . Allergy    hay fever  . History of chicken pox   . Hyperlipidemia   . Hypertension   . OSA (obstructive sleep apnea) 01/26/2012    Past Surgical History:  Procedure Laterality Date  . CARPAL TUNNEL RELEASE Right 05/04/13  . IRRIGATION AND DEBRIDEMENT SEBACEOUS CYST  2009    on back and neck    Current Outpatient Prescriptions on File Prior to Visit  Medication Sig Dispense Refill  . aspirin 81 MG tablet Take 81 mg by mouth daily.    Marland Kitchen. atenolol (TENORMIN) 50 MG tablet TAKE ONE TABLET BY MOUTH ONCE DAILY 30 tablet 0  . losartan (COZAAR) 50 MG tablet TAKE 1 TABLET (50 MG TOTAL) BY MOUTH DAILY. 30 tablet 0   No current facility-administered medications on file prior to visit.     No Known Allergies  Family History  Problem Relation Age of Onset  . Cancer Mother     breast    Social History   Social History  . Marital status: Married    Spouse name: N/A  . Number of children: N/A  . Years of education: N/A   Occupational History  . Not  on file.   Social History Main Topics  . Smoking status: Current Every Day Smoker    Packs/day: 0.25    Years: 25.00    Types: Cigarettes    Last attempt to quit: 06/17/2012  . Smokeless tobacco: Never Used  . Alcohol use No  . Drug use: No  . Sexual activity: Yes   Other Topics Concern  . Not on file   Social History Narrative   married   Review of Systems  Constitutional: Negative for fever and weight loss.  HENT: Negative for ear discharge, ear pain, hearing loss and tinnitus.   Eyes: Negative for blurred vision, double vision, photophobia and pain.  Respiratory: Negative for cough and shortness of breath.   Cardiovascular: Negative for chest pain and palpitations.  Gastrointestinal: Negative for abdominal pain, blood in stool, constipation, diarrhea, heartburn, melena, nausea and vomiting.  Genitourinary: Negative for dysuria, flank pain, frequency, hematuria and urgency.  Musculoskeletal: Negative for falls.  Neurological: Negative for dizziness, loss of consciousness and headaches.  Endo/Heme/Allergies: Negative for environmental allergies.  Psychiatric/Behavioral: Negative for depression, hallucinations, substance abuse and suicidal ideas. The patient is not nervous/anxious and does not have insomnia.     BP 126/84   Pulse 68   Temp 98.1 F (36.7 C) (Oral)   Resp 16   Ht 5\' 10"  (1.778 m)  Wt (!) 312 lb (141.5 kg)   SpO2 96%   BMI 44.77 kg/m   Physical Exam  Constitutional: He is oriented to person, place, and time and well-developed, well-nourished, and in no distress.  HENT:  Head: Normocephalic and atraumatic.  Right Ear: External ear normal.  Left Ear: External ear normal.  Nose: Nose normal.  Mouth/Throat: Oropharynx is clear and moist. No oropharyngeal exudate.  Eyes: Conjunctivae and EOM are normal. Pupils are equal, round, and reactive to light.  Neck: Neck supple. No thyromegaly present.  Cardiovascular: Normal rate, regular rhythm, normal heart  sounds and intact distal pulses.   Pulmonary/Chest: Effort normal and breath sounds normal. No respiratory distress. He has no wheezes. He has no rales. He exhibits no tenderness.  Abdominal: Soft. Bowel sounds are normal. He exhibits no distension and no mass. There is no tenderness. There is no rebound and no guarding.  Genitourinary: Testes/scrotum normal.  Lymphadenopathy:    He has no cervical adenopathy.  Neurological: He is alert and oriented to person, place, and time.  Skin: Skin is warm and dry. No rash noted.  Psychiatric: Affect normal.  Vitals reviewed.  Assessment/Plan: HTN (hypertension) BP normotensive.  Asymptomatic.  Continue current medication regimen.  Will work on diet and exercise to promote healthy weight. Labs today.   Visit for preventive health examination Depression screen negative. Health Maintenance reviewed -- immunizations updated. Preventive schedule discussed and handout given in AVS. Will obtain fasting labs today.   Hyperlipidemia Labs today to further assess. Dietary and exercise recommendations given.     Piedad Climes, PA-C

## 2016-08-12 NOTE — Patient Instructions (Signed)
Please go to the lab for blood work.   Our office will call you with your results unless you have chosen to receive results via MyChart.  If your blood work is normal we will follow-up each year for physicals and as scheduled for chronic medical problems.  If anything is abnormal we will treat accordingly and get you in for a follow-up.  Please continue chronic medications as directed.   Preventive Care 40-64 Years, Male Preventive care refers to lifestyle choices and visits with your health care provider that can promote health and wellness. What does preventive care include?  A yearly physical exam. This is also called an annual well check.  Dental exams once or twice a year.  Routine eye exams. Ask your health care provider how often you should have your eyes checked.  Personal lifestyle choices, including:  Daily care of your teeth and gums.  Regular physical activity.  Eating a healthy diet.  Avoiding tobacco and drug use.  Limiting alcohol use.  Practicing safe sex.  Taking low-dose aspirin every day starting at age 74. What happens during an annual well check? The services and screenings done by your health care provider during your annual well check will depend on your age, overall health, lifestyle risk factors, and family history of disease. Counseling  Your health care provider may ask you questions about your:  Alcohol use.  Tobacco use.  Drug use.  Emotional well-being.  Home and relationship well-being.  Sexual activity.  Eating habits.  Work and work Statistician. Screening  You may have the following tests or measurements:  Height, weight, and BMI.  Blood pressure.  Lipid and cholesterol levels. These may be checked every 5 years, or more frequently if you are over 50 years old.  Skin check.  Lung cancer screening. You may have this screening every year starting at age 71 if you have a 30-pack-year history of smoking and currently  smoke or have quit within the past 15 years.  Fecal occult blood test (FOBT) of the stool. You may have this test every year starting at age 42.  Flexible sigmoidoscopy or colonoscopy. You may have a sigmoidoscopy every 5 years or a colonoscopy every 10 years starting at age 43.  Prostate cancer screening. Recommendations will vary depending on your family history and other risks.  Hepatitis C blood test.  Hepatitis B blood test.  Sexually transmitted disease (STD) testing.  Diabetes screening. This is done by checking your blood sugar (glucose) after you have not eaten for a while (fasting). You may have this done every 1-3 years. Discuss your test results, treatment options, and if necessary, the need for more tests with your health care provider. Vaccines  Your health care provider may recommend certain vaccines, such as:  Influenza vaccine. This is recommended every year.  Tetanus, diphtheria, and acellular pertussis (Tdap, Td) vaccine. You may need a Td booster every 10 years.  Varicella vaccine. You may need this if you have not been vaccinated.  Zoster vaccine. You may need this after age 59.  Measles, mumps, and rubella (MMR) vaccine. You may need at least one dose of MMR if you were born in 1957 or later. You may also need a second dose.  Pneumococcal 13-valent conjugate (PCV13) vaccine. You may need this if you have certain conditions and have not been vaccinated.  Pneumococcal polysaccharide (PPSV23) vaccine. You may need one or two doses if you smoke cigarettes or if you have certain conditions.  Meningococcal vaccine.  You may need this if you have certain conditions.  Hepatitis A vaccine. You may need this if you have certain conditions or if you travel or work in places where you may be exposed to hepatitis A.  Hepatitis B vaccine. You may need this if you have certain conditions or if you travel or work in places where you may be exposed to hepatitis  B.  Haemophilus influenzae type b (Hib) vaccine. You may need this if you have certain risk factors. Talk to your health care provider about which screenings and vaccines you need and how often you need them. This information is not intended to replace advice given to you by your health care provider. Make sure you discuss any questions you have with your health care provider. Document Released: 07/28/2015 Document Revised: 03/20/2016 Document Reviewed: 05/02/2015 Elsevier Interactive Patient Education  2017 [employer].

## 2016-08-12 NOTE — Progress Notes (Signed)
Pre visit review using our clinic review tool, if applicable. No additional management support is needed unless otherwise documented below in the visit note. 

## 2016-08-13 ENCOUNTER — Other Ambulatory Visit: Payer: Self-pay | Admitting: Physician Assistant

## 2016-08-13 DIAGNOSIS — E785 Hyperlipidemia, unspecified: Secondary | ICD-10-CM

## 2016-08-13 MED ORDER — ATORVASTATIN CALCIUM 10 MG PO TABS: 10.0000 mg | ORAL_TABLET | Freq: Every day | ORAL | 1 refills | Status: DC

## 2016-08-15 NOTE — Assessment & Plan Note (Signed)
Labs today to further assess. Dietary and exercise recommendations given.

## 2016-08-15 NOTE — Assessment & Plan Note (Signed)
Depression screen negative. Health Maintenance reviewed -- immunizations updated. Preventive schedule discussed and handout given in AVS. Will obtain fasting labs today.

## 2016-08-15 NOTE — Assessment & Plan Note (Signed)
BP normotensive.  Asymptomatic.  Continue current medication regimen.  Will work on diet and exercise to promote healthy weight. Labs today.

## 2016-08-26 ENCOUNTER — Telehealth: Payer: Self-pay | Admitting: Physician Assistant

## 2016-08-26 DIAGNOSIS — E785 Hyperlipidemia, unspecified: Secondary | ICD-10-CM

## 2016-08-26 MED ORDER — SIMVASTATIN 20 MG PO TABS: 20.0000 mg | ORAL_TABLET | Freq: Every day | ORAL | 1 refills | Status: DC

## 2016-08-26 NOTE — Telephone Encounter (Signed)
Patient's wife called and stated that patient is not going to be taking generic lipitor. He took 2 doses and had a flare up of gout. He stopped taking that and started taking simvistatin again. She states he is doing well on the simvistatin. He will need a new rx for simvistatin. [facility] uses [facility] on [address].  Please call the patient at [phone].

## 2016-08-26 NOTE — Telephone Encounter (Signed)
See note

## 2016-08-26 NOTE — Telephone Encounter (Signed)
Do not think are related but if he wants to stay on simvastatin, need higher dose -- start Simvastatin 20 mg daily. Ok to send in Rx - 30 and 1. Needs to still repeat LFT and lipid as scheduled in 4 weeks.

## 2016-08-26 NOTE — Telephone Encounter (Signed)
Advised patient didn't think the gout flare up was associated with the Lipitor. Patient is agreeable with starting the Simvastatin 20 mg since he tolerated well. [facility] Advised will need to repeat lipid and lfts in 4 weeks on the higher dose of Simvastatin. He is agreeable.

## 2016-09-05 ENCOUNTER — Other Ambulatory Visit: Payer: Self-pay | Admitting: Physician Assistant

## 2016-09-05 DIAGNOSIS — I10 Essential (primary) hypertension: Secondary | ICD-10-CM

## 2016-09-06 ENCOUNTER — Other Ambulatory Visit: Payer: Self-pay | Admitting: Physician Assistant

## 2016-10-23 ENCOUNTER — Other Ambulatory Visit: Payer: Self-pay | Admitting: Physician Assistant

## 2016-10-23 DIAGNOSIS — E785 Hyperlipidemia, unspecified: Secondary | ICD-10-CM

## 2016-10-24 NOTE — Telephone Encounter (Signed)
Patient stated he felt the Atorvastatin increased his gout flares. Was started on higher dose of the Simvastatin 20 mg.

## 2016-10-24 NOTE — Telephone Encounter (Signed)
At last visit 08/12/16 his ldl was elevated and was to switch from Simvastatin to Atorvastatin.

## 2016-11-22 ENCOUNTER — Telehealth: Payer: Self-pay | Admitting: Physician Assistant

## 2016-11-22 DIAGNOSIS — E785 Hyperlipidemia, unspecified: Secondary | ICD-10-CM

## 2016-11-26 NOTE — Telephone Encounter (Signed)
Patient is overdue for lab appointment. He was supposed to return in February for labs. I will allow 30 tablets only until labs are completed.

## 2016-11-26 NOTE — Telephone Encounter (Signed)
Wife called back to us stating that pt is out of zocor and asking that it called in today, [facility] on [address]

## 2016-11-26 NOTE — Telephone Encounter (Signed)
Pt called stating pharmacy had requested refill for this med (11-22-2016) and has not received a response or has not received rx yet. Please advise ASAP.

## 2016-11-26 NOTE — Telephone Encounter (Signed)
Spoke with patient wife Micheal Mckinney about he will need a fasting lab visit to make sure the medication is working. The lab orders are in chart just need to have drawn. Sent 30 day of the Simvastatin to the pharmacy. Patient wife will call back about scheduling an appointment

## 2016-11-28 NOTE — Telephone Encounter (Signed)
Patient's wife calling back to report patient will go to [facility] [place] office tomorrow morning to have lab work done.  Please enter order for labs to be done at [facility].

## 2016-11-28 NOTE — Telephone Encounter (Signed)
Future labs already in the chart.

## 2016-11-29 ENCOUNTER — Other Ambulatory Visit (INDEPENDENT_AMBULATORY_CARE_PROVIDER_SITE_OTHER): Payer: 59

## 2016-11-29 DIAGNOSIS — E785 Hyperlipidemia, unspecified: Secondary | ICD-10-CM | POA: Diagnosis not present

## 2016-11-29 LAB — LIPID PANEL
Cholesterol: 165 mg/dL (ref 0–200)
HDL: 29.8 mg/dL — ABNORMAL LOW (ref >39.00)
LDL Cholesterol: 110 mg/dL — ABNORMAL HIGH (ref 0–99)
NonHDL: 135.02
Total CHOL/HDL Ratio: 6
Triglycerides: 125 mg/dL (ref 0.0–149.0)
VLDL: 25 mg/dL (ref 0.0–40.0)

## 2016-11-29 LAB — HEPATIC FUNCTION PANEL
ALT: 25 U/L (ref 0–53)
AST: 19 U/L (ref 0–37)
Albumin: 4.3 g/dL (ref 3.5–5.2)
Alkaline Phosphatase: 61 U/L (ref 39–117)
BILIRUBIN DIRECT: 0.1 mg/dL (ref 0.0–0.3)
BILIRUBIN TOTAL: 0.5 mg/dL (ref 0.2–1.2)
TOTAL PROTEIN: 6.7 g/dL (ref 6.0–8.3)

## 2016-12-17 ENCOUNTER — Other Ambulatory Visit: Payer: Self-pay | Admitting: Physician Assistant

## 2016-12-17 DIAGNOSIS — E785 Hyperlipidemia, unspecified: Secondary | ICD-10-CM

## 2017-01-05 ENCOUNTER — Other Ambulatory Visit: Payer: Self-pay | Admitting: Physician Assistant

## 2017-02-10 ENCOUNTER — Ambulatory Visit: Payer: Self-pay | Admitting: Physician Assistant

## 2017-02-10 ENCOUNTER — Encounter: Payer: Self-pay | Admitting: Physician Assistant

## 2017-02-10 ENCOUNTER — Ambulatory Visit (INDEPENDENT_AMBULATORY_CARE_PROVIDER_SITE_OTHER): Payer: 59 | Admitting: Physician Assistant

## 2017-02-10 VITALS — BP 138/90 | HR 78 | Temp 98.7°F | Resp 16 | Ht 70.0 in | Wt 311.8 lb

## 2017-02-10 DIAGNOSIS — J302 Other seasonal allergic rhinitis: Secondary | ICD-10-CM | POA: Diagnosis not present

## 2017-02-10 DIAGNOSIS — K219 Gastro-esophageal reflux disease without esophagitis: Secondary | ICD-10-CM | POA: Diagnosis not present

## 2017-02-10 DIAGNOSIS — R112 Nausea with vomiting, unspecified: Secondary | ICD-10-CM

## 2017-02-10 LAB — COMPREHENSIVE METABOLIC PANEL
ALK PHOS: 60 U/L (ref 39–117)
ALT: 24 U/L (ref 0–53)
AST: 16 U/L (ref 0–37)
Albumin: 4.3 g/dL (ref 3.5–5.2)
BILIRUBIN TOTAL: 0.4 mg/dL (ref 0.2–1.2)
BUN: 11 mg/dL (ref 6–23)
CO2: 27 mEq/L (ref 19–32)
CREATININE: 0.95 mg/dL (ref 0.40–1.50)
Calcium: 10.5 mg/dL (ref 8.4–10.5)
Chloride: 105 mEq/L (ref 96–112)
GFR: 90.59 mL/min (ref >60.00)
GLUCOSE: 94 mg/dL (ref 70–99)
POTASSIUM: 4.3 meq/L (ref 3.5–5.1)
SODIUM: 141 meq/L (ref 135–145)
TOTAL PROTEIN: 6.8 g/dL (ref 6.0–8.3)

## 2017-02-10 LAB — POC URINALSYSI DIPSTICK (AUTOMATED)
BILIRUBIN UA: NEGATIVE
Blood, UA: NEGATIVE
Glucose, UA: NEGATIVE
KETONES UA: NEGATIVE
Nitrite, UA: NEGATIVE
Protein, UA: NEGATIVE
Spec Grav, UA: >=1.03 — AB (ref 1.010–1.025)
Urobilinogen, UA: 0.2 E.U./dL
pH, UA: 5 (ref 5.0–8.0)

## 2017-02-10 LAB — CBC WITH DIFFERENTIAL/PLATELET
BASOS ABS: 0.1 10*3/uL (ref 0.0–0.1)
Basophils Relative: 0.7 % (ref 0.0–3.0)
EOS ABS: 0.1 10*3/uL (ref 0.0–0.7)
Eosinophils Relative: 1.7 % (ref 0.0–5.0)
HCT: 46.4 % (ref 39.0–52.0)
Hemoglobin: 15.6 g/dL (ref 13.0–17.0)
LYMPHS ABS: 3.3 10*3/uL (ref 0.7–4.0)
Lymphocytes Relative: 38.2 % (ref 12.0–46.0)
MCHC: 33.5 g/dL (ref 30.0–36.0)
MCV: 87.7 fl (ref 78.0–100.0)
MONO ABS: 0.7 10*3/uL (ref 0.1–1.0)
Monocytes Relative: 7.7 % (ref 3.0–12.0)
NEUTROS PCT: 51.7 % (ref 43.0–77.0)
Neutro Abs: 4.5 10*3/uL (ref 1.4–7.7)
Platelets: 260 10*3/uL (ref 150.0–400.0)
RBC: 5.29 Mil/uL (ref 4.22–5.81)
RDW: 13.8 % (ref 11.5–15.5)
WBC: 8.7 10*3/uL (ref 4.0–10.5)

## 2017-02-10 LAB — H. PYLORI ANTIBODY, IGG: H Pylori IgG: NEGATIVE

## 2017-02-10 LAB — LIPASE: LIPASE: 29 U/L (ref 11.0–59.0)

## 2017-02-10 MED ORDER — ONDANSETRON HCL 4 MG PO TABS: 4.0000 mg | ORAL_TABLET | Freq: Three times a day (TID) | ORAL | 0 refills | Status: DC | PRN

## 2017-02-10 MED ORDER — LEVOCETIRIZINE DIHYDROCHLORIDE 5 MG PO TABS: 5.0000 mg | ORAL_TABLET | Freq: Every evening | ORAL | 1 refills | Status: DC

## 2017-02-10 MED ORDER — FLUTICASONE PROPIONATE 50 MCG/ACT NA SUSP: 2.0000 | Freq: Every day | NASAL | 2 refills | Status: DC

## 2017-02-10 MED ORDER — PANTOPRAZOLE SODIUM 40 MG PO TBEC: 40.0000 mg | DELAYED_RELEASE_TABLET | Freq: Every day | ORAL | 1 refills | Status: DC

## 2017-02-10 NOTE — Patient Instructions (Signed)
Please go to the lab for blood work. I will call you with your results. Stay well-hydrated and eat a bland diet.  Start the zofran as directed for nausea.  For allergy symptoms -- start the Xyzal once daily along with the Flonase nasal spray. This will help with nasal congestion and post-nasal drip that is worsening reflux symptoms.  For reflux -- Take the protonix as directed. Eat a bland diet.  We will alter your regimen based on lab results.   If anything acutely worsens, please go to the ER.

## 2017-02-10 NOTE — Progress Notes (Signed)
Patient presents to clinic today c/o rhinorrhea and sneezing along with PND and dry cough. Patient with history of allergic rhinitis. Is not currently taking anything for symptoms. Patient denies sinus pressure, sinus pain, fever or chills. Notes a dry cough occasionally. Patient notes 1 week of nausea with some episodes of non bloody emesis. Denies abdominal pain. Denies use of NSAIDs or alcohol. Denies decrease in appetite. Denies change in diet. Denies recent travel or sick contact. Has not taken anything for symptoms.   Past Medical History:  Diagnosis Date  . Allergy    hay fever  . History of chicken pox   . Hyperlipidemia   . Hypertension   . OSA (obstructive sleep apnea) 01/26/2012    Current Outpatient Prescriptions on File Prior to Visit  Medication Sig Dispense Refill  . aspirin 81 MG tablet Take 81 mg by mouth daily.    Marland Kitchen atenolol (TENORMIN) 50 MG tablet TAKE ONE TABLET BY MOUTH ONCE DAILY 90 tablet 1  . losartan (COZAAR) 50 MG tablet TAKE 1 TABLET BY MOUTH ONCE A DAY  90 tablet 1  . simvastatin (ZOCOR) 20 MG tablet TAKE ONE TABLET BY MOUTH AT BEDTIME  90 tablet 1   No current facility-administered medications on file prior to visit.     No Known Allergies  Family History  Problem Relation Age of Onset  . Cancer Mother        breast    Social History   Social History  . Marital status: Married    Spouse name: N/A  . Number of children: N/A  . Years of education: N/A   Social History Main Topics  . Smoking status: Current Some Day Smoker    Packs/day: 0.25    Years: 25.00    Types: Cigarettes    Last attempt to quit: 06/17/2012  . Smokeless tobacco: Never Used  . Alcohol use No  . Drug use: No  . Sexual activity: Yes   Other Topics Concern  . None   Social History Narrative   married   Review of Systems - See HPI.  All other ROS are negative.  BP 138/90 (BP Location: Left Arm, Cuff Size: Large)   Pulse 78   Temp 98.7 F (37.1 C) (Oral)   Resp  16   Ht '5\' 10"'$  (1.778 m)   Wt (!) 311 lb 12.8 oz (141.4 kg)   SpO2 98%   BMI 44.74 kg/m   Physical Exam  Constitutional: He is oriented to person, place, and time and well-developed, well-nourished, and in no distress.  HENT:  Head: Normocephalic and atraumatic.  Right Ear: Tympanic membrane and external ear normal.  Left Ear: Tympanic membrane and external ear normal.  Nose: Mucosal edema and rhinorrhea present. Right sinus exhibits no maxillary sinus tenderness and no frontal sinus tenderness. Left sinus exhibits no maxillary sinus tenderness and no frontal sinus tenderness.  Mouth/Throat: Oropharynx is clear and moist. No oropharyngeal exudate.  Eyes: Conjunctivae are normal.  Neck: Neck supple.  Cardiovascular: Normal rate, regular rhythm, normal heart sounds and intact distal pulses.   Pulmonary/Chest: Effort normal. No respiratory distress. He has no wheezes. He has no rales. He exhibits no tenderness.  Abdominal: Soft. Bowel sounds are normal. He exhibits no distension and no mass. There is tenderness in the left upper quadrant. There is no rebound and no guarding.  Neurological: He is alert and oriented to person, place, and time.  Skin: Skin is warm and dry. No rash noted.  Psychiatric: Affect normal.  Vitals reviewed.   Recent Results (from the past 2160 hour(s))  Lipid panel     Status: Abnormal   Collection Time: 11/29/16  8:14 AM  Result Value Ref Range   Cholesterol 165 0 - 200 mg/dL    Comment: ATP III Classification       Desirable:  < 200 mg/dL               Borderline High:  200 - 239 mg/dL          High:  > = 240 mg/dL   Triglycerides 125.0 0.0 - 149.0 mg/dL    Comment: Normal:  <150 mg/dLBorderline High:  150 - 199 mg/dL   HDL 29.80 (L) >39.00 mg/dL   VLDL 25.0 0.0 - 40.0 mg/dL   LDL Cholesterol 110 (H) 0 - 99 mg/dL   Total CHOL/HDL Ratio 6     Comment:                Men          Women1/2 Average Risk     3.4          3.3Average Risk          5.0           4.42X Average Risk          9.6          7.13X Average Risk          15.0          11.0                       NonHDL 135.02     Comment: NOTE:  Non-HDL goal should be 30 mg/dL higher than patient's LDL goal (i.e. LDL goal of < 70 mg/dL, would have non-HDL goal of < 100 mg/dL)  Hepatic function panel     Status: None   Collection Time: 11/29/16  8:14 AM  Result Value Ref Range   Total Bilirubin 0.5 0.2 - 1.2 mg/dL   Bilirubin, Direct 0.1 0.0 - 0.3 mg/dL   Alkaline Phosphatase 61 39 - 117 U/L   AST 19 0 - 37 U/L   ALT 25 0 - 53 U/L   Total Protein 6.7 6.0 - 8.3 g/dL   Albumin 4.3 3.5 - 5.2 g/dL    Assessment/Plan: 1. Non-intractable vomiting with nausea, unspecified vomiting type Suspect secondary to significant PND from allergic rhinitis and subsequent gastritis from increased reflux. Will check lab panel today. Start zofran for symptom relief while we are assessing further and treating GERD and allergic rhinitis. No epigastric or RUQ tenderness on exam. Will hold off on imaging until labs result. Strict ER precautions reviewed with patient.  - Lipase - CBC w/Diff - Comp Met (CMET) - H. pylori antibody, IgG - pantoprazole (PROTONIX) 40 MG tablet; Take 1 tablet (40 mg total) by mouth daily.  Dispense: 30 tablet; Refill: 1 - ondansetron (ZOFRAN) 4 MG tablet; Take 1 tablet (4 mg total) by mouth every 8 (eight) hours as needed for nausea or vomiting.  Dispense: 20 tablet; Refill: 0  2. Gastroesophageal reflux disease without esophagitis Exacerbated due to pnd. Start protonix and GERD diet. Lab above  - pantoprazole (PROTONIX) 40 MG tablet; Take 1 tablet (40 mg total) by mouth daily.  Dispense: 30 tablet; Refill: 1  3. Seasonal allergic rhinitis, unspecified trigger Rx Flonase and Xyzal. Supportive measures and OTC medications reviewed.  - fluticasone (  FLONASE) 50 MCG/ACT nasal spray; Place 2 sprays into both nostrils daily.  Dispense: 16 g; Refill: 2 - levocetirizine (XYZAL ALLERGY 24HR) 5  MG tablet; Take 1 tablet (5 mg total) by mouth every evening.  Dispense: 30 tablet; Refill: 1   Leeanne Rio, [place]

## 2017-03-04 ENCOUNTER — Other Ambulatory Visit: Payer: Self-pay | Admitting: Physician Assistant

## 2017-03-04 DIAGNOSIS — I10 Essential (primary) hypertension: Secondary | ICD-10-CM

## 2017-04-29 DIAGNOSIS — M542 Cervicalgia: Secondary | ICD-10-CM | POA: Diagnosis not present

## 2017-04-29 DIAGNOSIS — M5412 Radiculopathy, cervical region: Secondary | ICD-10-CM | POA: Diagnosis not present

## 2017-04-29 DIAGNOSIS — M5091 Cervical disc disorder, unspecified,  high cervical region: Secondary | ICD-10-CM | POA: Diagnosis not present

## 2017-05-07 ENCOUNTER — Ambulatory Visit
Admission: RE | Admit: 2017-05-07 | Discharge: 2017-05-07 | Disposition: A | Discharge status: Home / Self Care | Payer: 59 | Source: Ambulatory Visit | Attending: Orthopaedic Surgery | Admitting: Orthopaedic Surgery

## 2017-05-07 ENCOUNTER — Other Ambulatory Visit: Payer: Self-pay | Admitting: Orthopaedic Surgery

## 2017-05-07 DIAGNOSIS — M542 Cervicalgia: Secondary | ICD-10-CM

## 2017-05-07 DIAGNOSIS — M4802 Spinal stenosis, cervical region: Secondary | ICD-10-CM | POA: Diagnosis not present

## 2017-05-14 DIAGNOSIS — M5412 Radiculopathy, cervical region: Secondary | ICD-10-CM | POA: Diagnosis not present

## 2017-05-14 DIAGNOSIS — M50122 Cervical disc disorder at C5-C6 level with radiculopathy: Secondary | ICD-10-CM | POA: Diagnosis not present

## 2017-05-14 DIAGNOSIS — M542 Cervicalgia: Secondary | ICD-10-CM | POA: Diagnosis not present

## 2017-05-26 DIAGNOSIS — M542 Cervicalgia: Secondary | ICD-10-CM | POA: Diagnosis not present

## 2017-05-26 DIAGNOSIS — M50123 Cervical disc disorder at C6-C7 level with radiculopathy: Secondary | ICD-10-CM | POA: Diagnosis not present

## 2017-05-26 DIAGNOSIS — M50122 Cervical disc disorder at C5-C6 level with radiculopathy: Secondary | ICD-10-CM | POA: Diagnosis not present

## 2017-05-29 DIAGNOSIS — M542 Cervicalgia: Secondary | ICD-10-CM | POA: Diagnosis not present

## 2017-05-29 DIAGNOSIS — Z01812 Encounter for preprocedural laboratory examination: Secondary | ICD-10-CM | POA: Diagnosis not present

## 2017-05-29 DIAGNOSIS — Z01810 Encounter for preprocedural cardiovascular examination: Secondary | ICD-10-CM | POA: Diagnosis not present

## 2017-06-03 DIAGNOSIS — M50023 Cervical disc disorder at C6-C7 level with myelopathy: Secondary | ICD-10-CM | POA: Diagnosis not present

## 2017-06-03 DIAGNOSIS — M50122 Cervical disc disorder at C5-C6 level with radiculopathy: Secondary | ICD-10-CM | POA: Diagnosis not present

## 2017-06-03 DIAGNOSIS — M5412 Radiculopathy, cervical region: Secondary | ICD-10-CM | POA: Diagnosis not present

## 2017-06-03 DIAGNOSIS — M2578 Osteophyte, vertebrae: Secondary | ICD-10-CM | POA: Diagnosis not present

## 2017-06-03 DIAGNOSIS — G4733 Obstructive sleep apnea (adult) (pediatric): Secondary | ICD-10-CM | POA: Diagnosis not present

## 2017-06-03 DIAGNOSIS — M50123 Cervical disc disorder at C6-C7 level with radiculopathy: Secondary | ICD-10-CM | POA: Diagnosis not present

## 2017-06-03 DIAGNOSIS — M542 Cervicalgia: Secondary | ICD-10-CM | POA: Diagnosis not present

## 2017-06-04 DIAGNOSIS — M542 Cervicalgia: Secondary | ICD-10-CM | POA: Diagnosis not present

## 2017-06-18 DIAGNOSIS — M50122 Cervical disc disorder at C5-C6 level with radiculopathy: Secondary | ICD-10-CM | POA: Diagnosis not present

## 2017-07-08 ENCOUNTER — Other Ambulatory Visit: Payer: Self-pay | Admitting: Physician Assistant

## 2017-08-08 ENCOUNTER — Other Ambulatory Visit: Payer: Self-pay | Admitting: Physician Assistant

## 2017-08-20 DIAGNOSIS — M4322 Fusion of spine, cervical region: Secondary | ICD-10-CM | POA: Diagnosis not present

## 2017-09-03 ENCOUNTER — Other Ambulatory Visit: Payer: Self-pay | Admitting: Physician Assistant

## 2017-09-03 DIAGNOSIS — I10 Essential (primary) hypertension: Secondary | ICD-10-CM

## 2017-09-04 ENCOUNTER — Other Ambulatory Visit: Payer: Self-pay | Admitting: Emergency Medicine

## 2017-09-04 ENCOUNTER — Encounter: Payer: Self-pay | Admitting: Emergency Medicine

## 2017-09-04 MED ORDER — LOSARTAN POTASSIUM 50 MG PO TABS: ORAL_TABLET | ORAL | 0 refills | Status: DC

## 2017-09-04 NOTE — Telephone Encounter (Signed)
My chart message sent advising due for CPE. Atenolol mediation sent to the pharmacy for #30.

## 2017-09-16 ENCOUNTER — Telehealth: Payer: Self-pay | Admitting: Physician Assistant

## 2017-09-16 ENCOUNTER — Other Ambulatory Visit: Payer: Self-pay | Admitting: Emergency Medicine

## 2017-09-16 MED ORDER — OLMESARTAN MEDOXOMIL 20 MG PO TABS: 20.0000 mg | ORAL_TABLET | Freq: Every day | ORAL | 1 refills | Status: DC

## 2017-09-16 NOTE — Telephone Encounter (Signed)
Copied from CRM [id number]. Topic: Quick Communication - Rx Refill/Question >> Sep 16, 2017 11:08 AM Micheal Mckinney, Shiquita C wrote: Medication:  losartan (COZAAR) 50 MG tablet   Has the patient contacted their pharmacy? Yes ---- Pt says that he was advised by his pharmacy that his BP medication has been recalled, Losartan.  He would like to be advised further.   CB: [phone]    (Agent: If no, request that the patient contact the pharmacy for the refill.)   Preferred Pharmacy (with phone number or street name): ***   Agent: Please be advised that RX refills may take up to 3 business days. We ask that you follow-up with your pharmacy.

## 2017-09-16 NOTE — Telephone Encounter (Signed)
Stop the Losartan. Start Olmesartan 20 mg daily. Ok to give 30 with 1 refill.   Needs F/U in 2 weeks to reassess BP. He is overdue for CPE. Recommend he schedule a CPE in 2 weeks and we can take care of everything.

## 2017-09-16 NOTE — Telephone Encounter (Signed)
Patient informed and Rx sent to pharmacy. CPE scheduled for Friday 3/22

## 2017-09-30 ENCOUNTER — Telehealth: Payer: Self-pay

## 2017-09-30 ENCOUNTER — Other Ambulatory Visit: Payer: Self-pay

## 2017-09-30 MED ORDER — TELMISARTAN 40 MG PO TABS: 40.0000 mg | ORAL_TABLET | Freq: Every day | ORAL | 1 refills | Status: DC

## 2017-09-30 NOTE — Telephone Encounter (Signed)
Will have to switch to Temisartan 40 mg daily.  I have sent in 30 tablets with 1 refill.  Please let patient know.  Have him keep close eye on BP levels over next 2 weeks. If elevated, he needs to call or come see me.

## 2017-09-30 NOTE — Telephone Encounter (Signed)
[facility] in [place] sent a fax that the Olmesartan 20mg  is on backorder and was not filled for the month of March 2019.   I called [facility] and spoke to [name] and she confirmed that this medication has not been filled for March 2019.  Selena BattenCody is aware and stated that he would send electronic prescription for alternative.

## 2017-09-30 NOTE — Telephone Encounter (Signed)
Called patient and let him know about the new prescription for Temisartan 40mg  Daily. He has an appointment on Friday and will go over with us when he comes in for his appointment. Patient will start new prescription.

## 2017-09-30 NOTE — Telephone Encounter (Signed)
°  [facility] told him this medication  Olmesartan 20 mg daily is on backorder and he needs  Doctor to call in something different.   [facility]  [phone]

## 2017-10-01 ENCOUNTER — Other Ambulatory Visit: Payer: Self-pay | Admitting: Physician Assistant

## 2017-10-01 ENCOUNTER — Encounter: Payer: 59 | Admitting: Physician Assistant

## 2017-10-01 DIAGNOSIS — I10 Essential (primary) hypertension: Secondary | ICD-10-CM

## 2017-10-01 NOTE — Telephone Encounter (Signed)
Incorrect phone note was sent to me.   I have seen documentation on the phone note from 09/30/17

## 2017-10-01 NOTE — Telephone Encounter (Signed)
See phone note from yesterday. This has already been taking care of. We called patient and told him about the switch to Micardis and that the script was sent to the pharmacy yesterday.   Please call the patient to remind him this has been done.

## 2017-10-03 ENCOUNTER — Other Ambulatory Visit: Payer: Self-pay

## 2017-10-03 ENCOUNTER — Encounter: Payer: Self-pay | Admitting: Physician Assistant

## 2017-10-03 ENCOUNTER — Ambulatory Visit (INDEPENDENT_AMBULATORY_CARE_PROVIDER_SITE_OTHER): Payer: 59 | Admitting: Physician Assistant

## 2017-10-03 VITALS — BP 132/84 | HR 68 | Temp 98.6°F | Resp 16 | Ht 69.0 in | Wt 313.0 lb

## 2017-10-03 DIAGNOSIS — Z23 Encounter for immunization: Secondary | ICD-10-CM

## 2017-10-03 DIAGNOSIS — E785 Hyperlipidemia, unspecified: Secondary | ICD-10-CM | POA: Diagnosis not present

## 2017-10-03 DIAGNOSIS — Z0000 Encounter for general adult medical examination without abnormal findings: Secondary | ICD-10-CM

## 2017-10-03 DIAGNOSIS — I10 Essential (primary) hypertension: Secondary | ICD-10-CM

## 2017-10-03 LAB — CBC WITH DIFFERENTIAL/PLATELET
BASOS ABS: 0.1 10*3/uL (ref 0.0–0.1)
Basophils Relative: 0.9 % (ref 0.0–3.0)
EOS ABS: 0.1 10*3/uL (ref 0.0–0.7)
Eosinophils Relative: 1.8 % (ref 0.0–5.0)
HCT: 45.7 % (ref 39.0–52.0)
Hemoglobin: 15.4 g/dL (ref 13.0–17.0)
LYMPHS ABS: 2.4 10*3/uL (ref 0.7–4.0)
Lymphocytes Relative: 30.7 % (ref 12.0–46.0)
MCHC: 33.7 g/dL (ref 30.0–36.0)
MCV: 85.9 fl (ref 78.0–100.0)
MONO ABS: 0.6 10*3/uL (ref 0.1–1.0)
Monocytes Relative: 7.6 % (ref 3.0–12.0)
NEUTROS ABS: 4.7 10*3/uL (ref 1.4–7.7)
NEUTROS PCT: 59 % (ref 43.0–77.0)
PLATELETS: 253 10*3/uL (ref 150.0–400.0)
RBC: 5.32 Mil/uL (ref 4.22–5.81)
RDW: 13.4 % (ref 11.5–15.5)
WBC: 7.9 10*3/uL (ref 4.0–10.5)

## 2017-10-03 LAB — LIPID PANEL
CHOL/HDL RATIO: 7
Cholesterol: 213 mg/dL — ABNORMAL HIGH (ref 0–200)
HDL: 32.2 mg/dL — ABNORMAL LOW (ref >39.00)
LDL Cholesterol: 160 mg/dL — ABNORMAL HIGH (ref 0–99)
NonHDL: 181.24
TRIGLYCERIDES: 108 mg/dL (ref 0.0–149.0)
VLDL: 21.6 mg/dL (ref 0.0–40.0)

## 2017-10-03 LAB — COMPREHENSIVE METABOLIC PANEL
ALT: 20 U/L (ref 0–53)
AST: 13 U/L (ref 0–37)
Albumin: 4.2 g/dL (ref 3.5–5.2)
Alkaline Phosphatase: 69 U/L (ref 39–117)
BILIRUBIN TOTAL: 0.4 mg/dL (ref 0.2–1.2)
BUN: 14 mg/dL (ref 6–23)
CO2: 26 mEq/L (ref 19–32)
Calcium: 10.4 mg/dL (ref 8.4–10.5)
Chloride: 108 mEq/L (ref 96–112)
Creatinine, Ser: 0.81 mg/dL (ref 0.40–1.50)
GFR: 108.58 mL/min (ref >60.00)
GLUCOSE: 107 mg/dL — AB (ref 70–99)
Potassium: 4.5 mEq/L (ref 3.5–5.1)
SODIUM: 140 meq/L (ref 135–145)
TOTAL PROTEIN: 6.8 g/dL (ref 6.0–8.3)

## 2017-10-03 LAB — HEMOGLOBIN A1C: Hgb A1c MFr Bld: 5.8 % (ref 4.6–6.5)

## 2017-10-03 NOTE — Assessment & Plan Note (Signed)
BP stable. Awaiting Micardis to be filled. He is to take instead of Losartan. Continue Atenolol. Keep working on diet. Exercise recommendations given. Check BP daily at home x 2 weeks and record. If elevated, he is to call as we may need to adjust dose of new medication.

## 2017-10-03 NOTE — Assessment & Plan Note (Addendum)
Depression screen negative. Health Maintenance reviewed -- Flu shot updated today. Preventive schedule discussed and handout given in AVS. Will obtain fasting labs today.  

## 2017-10-03 NOTE — Assessment & Plan Note (Signed)
Repeat fasting lipids. Will consider starting a lower intensity statin like Pravachol versus Zetia. Will await results.

## 2017-10-03 NOTE — Progress Notes (Signed)
Patient presents to clinic today for annual exam.  Patient is fasting for labs.  Patient endorses working hard on diet. Exercise is lacking per patient. No regular regimen.   Acute Concerns: Denies acute concerns today.  Chronic Issues: Hypertension -- Previously on Atenolol and Losartan. Losartan on recall so was switched to Micardis. Is still waiting to fill. Has taken Losartan despite recall. Patient denies chest pain, palpitations, lightheadedness, dizziness, vision changes or frequent headaches.  Hyperlipidemia -- Previously on Simvastatin 20 mg. Unable to tolerate due to severe myalgias and worsening GERD. Has stopped medication.  Allergic Rhinitis -- Controlled with PRN Flonase.   Health Maintenance: Immunizations -- Tetanus up-to-date. Due for flu shot. Agrees to have today.   Past Medical History:  Diagnosis Date  . Allergy    hay fever  . History of chicken pox   . Hyperlipidemia   . Hypertension   . OSA (obstructive sleep apnea) 01/26/2012    Past Surgical History:  Procedure Laterality Date  . CARPAL TUNNEL RELEASE Right 05/04/13  . CERVICAL FUSION     Alternative to this done in 05/2017  . IRRIGATION AND DEBRIDEMENT SEBACEOUS CYST  2009    on back and neck    Current Outpatient Medications on File Prior to Visit  Medication Sig Dispense Refill  . aspirin 81 MG tablet Take 81 mg by mouth daily.    Marland Kitchen. atenolol (TENORMIN) 50 MG tablet TAKE 1 TABLET BY MOUTH ONCE DAILY 30 tablet 0  . ranitidine (ZANTAC) 75 MG tablet Take 75 mg by mouth at bedtime.    . simvastatin (ZOCOR) 20 MG tablet TAKE ONE TABLET BY MOUTH AT BEDTIME  (Patient not taking: Reported on 10/03/2017) 90 tablet 1  . telmisartan (MICARDIS) 40 MG tablet Take 1 tablet (40 mg total) by mouth daily. (Patient not taking: Reported on 10/03/2017) 30 tablet 1   No current facility-administered medications on file prior to visit.     No Known Allergies  Family History  Problem Relation Age of Onset    . Cancer Mother        breast    Social History   Socioeconomic History  . Marital status: Married    Spouse name: Not on file  . Number of children: Not on file  . Years of education: Not on file  . Highest education level: Not on file  Occupational History  . Not on file  Social Needs  . Financial resource strain: Not on file  . Food insecurity:    Worry: Not on file    Inability: Not on file  . Transportation needs:    Medical: Not on file    Non-medical: Not on file  Tobacco Use  . Smoking status: Current Some Day Smoker    Packs/day: 0.25    Years: 25.00    Pack years: 6.25    Types: Cigarettes    Last attempt to quit: 06/17/2012    Years since quitting: 5.2  . Smokeless tobacco: Never Used  Substance and Sexual Activity  . Alcohol use: No    Alcohol/week: 0.0 oz  . Drug use: No  . Sexual activity: Yes  Lifestyle  . Physical activity:    Days per week: Not on file    Minutes per session: Not on file  . Stress: Not on file  Relationships  . Social connections:    Talks on phone: Not on file    Gets together: Not on file    Attends religious  service: Not on file    Active member of club or organization: Not on file    Attends meetings of clubs or organizations: Not on file    Relationship status: Not on file  . Intimate partner violence:    Fear of current or ex partner: Not on file    Emotionally abused: Not on file    Physically abused: Not on file    Forced sexual activity: Not on file  Other Topics Concern  . Not on file  Social History Narrative   married   Review of Systems  Constitutional: Negative for fever and weight loss.  HENT: Negative for ear discharge, ear pain, hearing loss and tinnitus.   Eyes: Negative for blurred vision, double vision, photophobia and pain.  Respiratory: Negative for cough and shortness of breath.   Cardiovascular: Negative for chest pain and palpitations.  Gastrointestinal: Negative for abdominal pain, blood in  stool, constipation, diarrhea, heartburn, melena, nausea and vomiting.  Genitourinary: Negative for dysuria, flank pain, frequency, hematuria and urgency.  Musculoskeletal: Negative for falls.  Neurological: Negative for dizziness, loss of consciousness and headaches.  Endo/Heme/Allergies: Negative for environmental allergies.  Psychiatric/Behavioral: Negative for depression, hallucinations, substance abuse and suicidal ideas. The patient is not nervous/anxious and does not have insomnia.     BP 132/84 (BP Location: Left Arm, Cuff Size: Large)   Pulse 68   Temp 98.6 F (37 C) (Oral)   Resp 16   Ht 5\' 9"  (1.753 m)   Wt (!) 313 lb (142 kg)   SpO2 95%   BMI 46.22 kg/m   Physical Exam  Constitutional: He is oriented to person, place, and time and well-developed, well-nourished, and in no distress.  HENT:  Head: Normocephalic and atraumatic.  Right Ear: External ear normal.  Left Ear: External ear normal.  Nose: Nose normal.  Mouth/Throat: Oropharynx is clear and moist. No oropharyngeal exudate.  Eyes: Pupils are equal, round, and reactive to light. Conjunctivae and EOM are normal.  Neck: Neck supple. No thyromegaly present.  Cardiovascular: Normal rate, regular rhythm, normal heart sounds and intact distal pulses.  Pulmonary/Chest: Effort normal and breath sounds normal. No respiratory distress. He has no wheezes. He has no rales. He exhibits no tenderness.  Abdominal: Soft. Bowel sounds are normal. He exhibits no distension and no mass. There is no tenderness. There is no rebound and no guarding.  Genitourinary: Testes/scrotum normal and penis normal. No discharge found.  Lymphadenopathy:    He has no cervical adenopathy.  Neurological: He is alert and oriented to person, place, and time.  Skin: Skin is warm and dry. No rash noted.  Psychiatric: Affect normal.  Vitals reviewed.   Assessment/Plan: HTN (hypertension) BP stable. Awaiting Micardis to be filled. He is to take  instead of Losartan. Continue Atenolol. Keep working on diet. Exercise recommendations given. Check BP daily at home x 2 weeks and record. If elevated, he is to call as we may need to adjust dose of new medication.   Hyperlipidemia Repeat fasting lipids. Will consider starting a lower intensity statin like Pravachol versus Zetia. Will await results.   Visit for preventive health examination Depression screen negative. Health Maintenance reviewed. Flu shot updated today. Preventive schedule discussed and handout given in AVS. Will obtain fasting labs today.     Piedad Climes, PA-C

## 2017-10-03 NOTE — Patient Instructions (Signed)
Please go to the lab for blood work.   Our office will call you with your results unless you have chosen to receive results via MyChart.  If your blood work is normal we will follow-up each year for physicals and as scheduled for chronic medical problems.  If anything is abnormal we will treat accordingly and get you in for a follow-up.   Preventive Care 40-64 Years, Male Preventive care refers to lifestyle choices and visits with your health care provider that can promote health and wellness. What does preventive care include?  A yearly physical exam. This is also called an annual well check.  Dental exams once or twice a year.  Routine eye exams. Ask your health care provider how often you should have your eyes checked.  Personal lifestyle choices, including: ? Daily care of your teeth and gums. ? Regular physical activity. ? Eating a healthy diet. ? Avoiding tobacco and drug use. ? Limiting alcohol use. ? Practicing safe sex. ? Taking low-dose aspirin every day starting at age 57. What happens during an annual well check? The services and screenings done by your health care provider during your annual well check will depend on your age, overall health, lifestyle risk factors, and family history of disease. Counseling Your health care provider may ask you questions about your:  Alcohol use.  Tobacco use.  Drug use.  Emotional well-being.  Home and relationship well-being.  Sexual activity.  Eating habits.  Work and work Statistician.  Screening You may have the following tests or measurements:  Height, weight, and BMI.  Blood pressure.  Lipid and cholesterol levels. These may be checked every 5 years, or more frequently if you are over 68 years old.  Skin check.  Lung cancer screening. You may have this screening every year starting at age 49 if you have a 30-pack-year history of smoking and currently smoke or have quit within the past 15 years.  Fecal  occult blood test (FOBT) of the stool. You may have this test every year starting at age 65.  Flexible sigmoidoscopy or colonoscopy. You may have a sigmoidoscopy every 5 years or a colonoscopy every 10 years starting at age 56.  Prostate cancer screening. Recommendations will vary depending on your family history and other risks.  Hepatitis C blood test.  Hepatitis B blood test.  Sexually transmitted disease (STD) testing.  Diabetes screening. This is done by checking your blood sugar (glucose) after you have not eaten for a while (fasting). You may have this done every 1-3 years.  Discuss your test results, treatment options, and if necessary, the need for more tests with your health care provider. Vaccines Your health care provider may recommend certain vaccines, such as:  Influenza vaccine. This is recommended every year.  Tetanus, diphtheria, and acellular pertussis (Tdap, Td) vaccine. You may need a Td booster every 10 years.  Varicella vaccine. You may need this if you have not been vaccinated.  Zoster vaccine. You may need this after age 82.  Measles, mumps, and rubella (MMR) vaccine. You may need at least one dose of MMR if you were born in 1957 or later. You may also need a second dose.  Pneumococcal 13-valent conjugate (PCV13) vaccine. You may need this if you have certain conditions and have not been vaccinated.  Pneumococcal polysaccharide (PPSV23) vaccine. You may need one or two doses if you smoke cigarettes or if you have certain conditions.  Meningococcal vaccine. You may need this if you have certain  conditions.  Hepatitis A vaccine. You may need this if you have certain conditions or if you travel or work in places where you may be exposed to hepatitis A.  Hepatitis B vaccine. You may need this if you have certain conditions or if you travel or work in places where you may be exposed to hepatitis B.  Haemophilus influenzae type b (Hib) vaccine. You may need  this if you have certain risk factors.  Talk to your health care provider about which screenings and vaccines you need and how often you need them. This information is not intended to replace advice given to you by your health care provider. Make sure you discuss any questions you have with your health care provider. Document Released: 07/28/2015 Document Revised: 03/20/2016 Document Reviewed: 05/02/2015 Elsevier Interactive Patient Education  2018 [employer] .      

## 2017-10-06 ENCOUNTER — Other Ambulatory Visit: Payer: Self-pay

## 2017-10-06 DIAGNOSIS — E785 Hyperlipidemia, unspecified: Secondary | ICD-10-CM

## 2017-10-06 MED ORDER — PRAVASTATIN SODIUM 40 MG PO TABS: 40.0000 mg | ORAL_TABLET | Freq: Every day | ORAL | 1 refills | Status: DC

## 2017-11-02 ENCOUNTER — Other Ambulatory Visit: Payer: Self-pay | Admitting: Physician Assistant

## 2017-11-02 DIAGNOSIS — I10 Essential (primary) hypertension: Secondary | ICD-10-CM

## 2017-11-14 ENCOUNTER — Ambulatory Visit: Payer: 59 | Admitting: Cardiology

## 2017-11-17 ENCOUNTER — Ambulatory Visit: Payer: 59 | Admitting: Cardiology

## 2017-11-19 DIAGNOSIS — M542 Cervicalgia: Secondary | ICD-10-CM | POA: Diagnosis not present

## 2017-11-19 DIAGNOSIS — M4322 Fusion of spine, cervical region: Secondary | ICD-10-CM | POA: Diagnosis not present

## 2017-11-21 ENCOUNTER — Other Ambulatory Visit: Payer: 59

## 2017-11-28 ENCOUNTER — Other Ambulatory Visit: Payer: Self-pay | Admitting: Physician Assistant

## 2018-01-28 ENCOUNTER — Encounter: Payer: Self-pay | Admitting: Emergency Medicine

## 2018-01-28 ENCOUNTER — Other Ambulatory Visit: Payer: Self-pay | Admitting: Physician Assistant

## 2018-01-28 DIAGNOSIS — I10 Essential (primary) hypertension: Secondary | ICD-10-CM

## 2018-04-07 ENCOUNTER — Encounter: Payer: Self-pay | Admitting: Physician Assistant

## 2018-04-07 ENCOUNTER — Other Ambulatory Visit: Payer: Self-pay

## 2018-04-07 ENCOUNTER — Ambulatory Visit: Payer: 59 | Admitting: Physician Assistant

## 2018-04-07 VITALS — BP 140/80 | HR 80 | Temp 99.0°F | Resp 16 | Ht 69.0 in | Wt 301.0 lb

## 2018-04-07 DIAGNOSIS — T63301A Toxic effect of unspecified spider venom, accidental (unintentional), initial encounter: Secondary | ICD-10-CM | POA: Diagnosis not present

## 2018-04-07 DIAGNOSIS — Z23 Encounter for immunization: Secondary | ICD-10-CM

## 2018-04-07 MED ORDER — SULFAMETHOXAZOLE-TRIMETHOPRIM 800-160 MG PO TABS: 1.0000 | ORAL_TABLET | Freq: Two times a day (BID) | ORAL | 0 refills | Status: DC

## 2018-04-07 NOTE — Patient Instructions (Signed)
Please keep skin clean and dry.  Avoid squeezing at the area.  Take the antibiotic as directed with food.   Please return if you note any new or worsening symptoms. Your tetanus and flu shot were updated today.    Spider Bite Spider bites are not common. Most spider bites do not cause serious problems. There are only a few types of spider bites that can cause serious health problems. Follow these instructions at home: Medicine  Take or apply over-the-counter and prescription medicines only as told by your doctor.  If you were given an antibiotic medicine, take or apply it as told by your doctor. Do not stop using the antibiotic even if your condition improves. General instructions  Do not scratch the bite area.  Keep the bite area clean and dry. Wash the bite area with soap and water every day as told by your doctor.  If directed, apply ice to the bite area. ? Put ice in a plastic bag. ? Place a towel between your skin and the bag. ? Leave the ice on for 20 minutes, 2-3 times per day.  Raise (elevate) the affected area above the level of your heart while you are sitting or lying down, if this is possible.  Keep all follow-up visits as told by your doctor. This is important. Contact a doctor if:  Your bite does not get better after 3 days.  Your bite turns black or purple.  Near the bite, you have: ? Redness. ? Swelling (inflammation). ? Pain that is getting worse. Get help right away if:  You get shortness of breath or chest pain.  You have fluid, blood, or pus coming from the bite area.  You have muscle cramps or painful muscle spasms.  You have stomach (abdominal) pain.  You feel sick to your stomach (nauseous) or you throw up (vomit).  You feel more tired or sleepy than you normally do. This information is not intended to replace advice given to you by your health care provider. Make sure you discuss any questions you have with your health care  provider. Document Released: 08/03/2010 Document Revised: 02/26/2016 Document Reviewed: 11/16/2014 Elsevier Interactive Patient Education  [employer]2018 Elsevier Inc[employer]

## 2018-04-07 NOTE — Progress Notes (Signed)
Patient presents to clinic today c/o 2 days of painful area of posterior L foot/ankle. States he was cleaning an underground vault that was full of spiders. Notes a painful bite/sting in the area of concern while he was finishing cleaning. Notes redness and swelling in 2 areas. Denies fever, chills or drainage. Was seen by a provider through his work who told him it was a spider bite and wanted to start him on antibiotics. He noted that he wanted to see his PCP for assessment first.    Past Medical History:  Diagnosis Date  . Allergy    hay fever  . History of chicken pox   . Hyperlipidemia   . Hypertension   . OSA (obstructive sleep apnea) 01/26/2012    Current Outpatient Medications on File Prior to Visit  Medication Sig Dispense Refill  . aspirin 81 MG tablet Take 81 mg by mouth daily.    Marland Kitchen atenolol (TENORMIN) 50 MG tablet TAKE 1 TABLET BY MOUTH ONCE DAILY 90 tablet 1  . ranitidine (ZANTAC) 75 MG tablet Take 75 mg by mouth at bedtime.    Marland Kitchen telmisartan (MICARDIS) 40 MG tablet TAKE 1 TABLET BY MOUTH ONCE DAILY 90 tablet 1   No current facility-administered medications on file prior to visit.     No Known Allergies  Family History  Problem Relation Age of Onset  . Cancer Mother        breast    Social History   Socioeconomic History  . Marital status: Married    Spouse name: Not on file  . Number of children: Not on file  . Years of education: Not on file  . Highest education level: Not on file  Occupational History  . Not on file  Social Needs  . Financial resource strain: Not on file  . Food insecurity:    Worry: Not on file    Inability: Not on file  . Transportation needs:    Medical: Not on file    Non-medical: Not on file  Tobacco Use  . Smoking status: Current Some Day Smoker    Packs/day: 0.25    Years: 25.00    Pack years: 6.25    Types: Cigarettes    Last attempt to quit: 06/17/2012    Years since quitting: 5.8  . Smokeless tobacco: Never Used    Substance and Sexual Activity  . Alcohol use: No    Alcohol/week: 0.0 standard drinks  . Drug use: No  . Sexual activity: Yes  Lifestyle  . Physical activity:    Days per week: Not on file    Minutes per session: Not on file  . Stress: Not on file  Relationships  . Social connections:    Talks on phone: Not on file    Gets together: Not on file    Attends religious service: Not on file    Active member of club or organization: Not on file    Attends meetings of clubs or organizations: Not on file    Relationship status: Not on file  Other Topics Concern  . Not on file  Social History Narrative   married    Review of Systems - See HPI.  All other ROS are negative.  BP 140/80   Pulse 80   Temp 99 F (37.2 C) (Oral)   Resp 16   Ht 5\' 9"  (1.753 m)   Wt (!) 301 lb (136.5 kg)   SpO2 98%   BMI 44.45 kg/m  Physical Exam  Constitutional: He is oriented to person, place, and time. He appears well-developed and well-nourished.  HENT:  Head: Normocephalic and atraumatic.  Neck: Neck supple.  Cardiovascular: Normal rate, regular rhythm, normal heart sounds and intact distal pulses.  Pulmonary/Chest: Effort normal and breath sounds normal. No stridor. No respiratory distress. He has no wheezes. He has no rales. He exhibits no tenderness.  Neurological: He is alert and oriented to person, place, and time.  Skin:     Vitals reviewed.    Assessment/Plan: 1. Spider bite wound, accidental or unintentional, initial encounter Mild infection noted. Supportive measures and wound care reviewed. No evidence of necrosis. Start Bactrim DS. TDaP updated today. Close follow-up discussed.  - sulfamethoxazole-trimethoprim (BACTRIM DS,SEPTRA DS) 800-160 MG tablet; Take 1 tablet by mouth 2 (two) times daily.  Dispense: 14 tablet; Refill: 0  2. Need for immunization against influenza Flu shot updated today - Flu Vaccine QUAD 36+ mos IM  3. Need for Tdap vaccination TDaP updated  today. - Tdap vaccine greater than or equal to 7yo IM    Piedad ClimesWilliam Cody Kristal Perl, PA-C

## 2018-04-29 ENCOUNTER — Other Ambulatory Visit: Payer: Self-pay | Admitting: Physician Assistant

## 2018-04-29 DIAGNOSIS — I10 Essential (primary) hypertension: Secondary | ICD-10-CM

## 2018-05-15 ENCOUNTER — Encounter (HOSPITAL_BASED_OUTPATIENT_CLINIC_OR_DEPARTMENT_OTHER): Payer: Self-pay | Admitting: *Deleted

## 2018-05-15 ENCOUNTER — Other Ambulatory Visit: Payer: Self-pay | Admitting: Orthopedic Surgery

## 2018-05-15 ENCOUNTER — Other Ambulatory Visit: Payer: Self-pay

## 2018-05-18 ENCOUNTER — Ambulatory Visit (HOSPITAL_BASED_OUTPATIENT_CLINIC_OR_DEPARTMENT_OTHER): Payer: No Typology Code available for payment source | Admitting: Certified Registered"

## 2018-05-18 ENCOUNTER — Ambulatory Visit (HOSPITAL_BASED_OUTPATIENT_CLINIC_OR_DEPARTMENT_OTHER)
Admission: RE | Admit: 2018-05-18 | Discharge: 2018-05-18 | Disposition: A | Discharge status: Home / Self Care | Payer: No Typology Code available for payment source | Source: Ambulatory Visit | Attending: Orthopedic Surgery | Admitting: Orthopedic Surgery

## 2018-05-18 ENCOUNTER — Encounter (HOSPITAL_BASED_OUTPATIENT_CLINIC_OR_DEPARTMENT_OTHER): Payer: Self-pay | Admitting: Certified Registered"

## 2018-05-18 ENCOUNTER — Other Ambulatory Visit: Payer: Self-pay

## 2018-05-18 ENCOUNTER — Encounter (HOSPITAL_BASED_OUTPATIENT_CLINIC_OR_DEPARTMENT_OTHER)
Admission: RE | Disposition: A | Discharge status: Home / Self Care | Payer: Self-pay | Source: Ambulatory Visit | Attending: Orthopedic Surgery

## 2018-05-18 DIAGNOSIS — I10 Essential (primary) hypertension: Secondary | ICD-10-CM | POA: Insufficient documentation

## 2018-05-18 DIAGNOSIS — S60551A Superficial foreign body of right hand, initial encounter: Secondary | ICD-10-CM | POA: Diagnosis not present

## 2018-05-18 DIAGNOSIS — Z79899 Other long term (current) drug therapy: Secondary | ICD-10-CM | POA: Insufficient documentation

## 2018-05-18 DIAGNOSIS — E785 Hyperlipidemia, unspecified: Secondary | ICD-10-CM | POA: Diagnosis not present

## 2018-05-18 DIAGNOSIS — X58XXXA Exposure to other specified factors, initial encounter: Secondary | ICD-10-CM | POA: Diagnosis not present

## 2018-05-18 DIAGNOSIS — F17210 Nicotine dependence, cigarettes, uncomplicated: Secondary | ICD-10-CM | POA: Diagnosis not present

## 2018-05-18 DIAGNOSIS — K219 Gastro-esophageal reflux disease without esophagitis: Secondary | ICD-10-CM | POA: Diagnosis not present

## 2018-05-18 DIAGNOSIS — G4733 Obstructive sleep apnea (adult) (pediatric): Secondary | ICD-10-CM | POA: Diagnosis not present

## 2018-05-18 DIAGNOSIS — Y929 Unspecified place or not applicable: Secondary | ICD-10-CM | POA: Insufficient documentation

## 2018-05-18 DIAGNOSIS — Z7982 Long term (current) use of aspirin: Secondary | ICD-10-CM | POA: Insufficient documentation

## 2018-05-18 HISTORY — PX: FOREIGN BODY REMOVAL: SHX962

## 2018-05-18 HISTORY — DX: Gastro-esophageal reflux disease without esophagitis: K21.9

## 2018-05-18 HISTORY — DX: Residual foreign body in soft tissue: M79.5

## 2018-05-18 SURGERY — REMOVAL FOREIGN BODY EXTREMITY
Anesthesia: Regional | Site: Hand | Laterality: Right

## 2018-05-18 MED ORDER — PROPOFOL 500 MG/50ML IV EMUL
INTRAVENOUS | Status: DC | PRN
  Administered 2018-05-18: 50 ug/kg/min via INTRAVENOUS

## 2018-05-18 MED ORDER — FENTANYL CITRATE (PF) 100 MCG/2ML IJ SOLN
50.0000 ug | INTRAMUSCULAR | Status: DC | PRN
  Administered 2018-05-18: 100 ug via INTRAVENOUS

## 2018-05-18 MED ORDER — FENTANYL CITRATE (PF) 100 MCG/2ML IJ SOLN
INTRAMUSCULAR | Status: AC
  Filled 2018-05-18: qty 2

## 2018-05-18 MED ORDER — CHLORHEXIDINE GLUCONATE 4 % EX LIQD: 60.0000 mL | Freq: Once | CUTANEOUS | Status: DC

## 2018-05-18 MED ORDER — PROPOFOL 10 MG/ML IV BOLUS
INTRAVENOUS | Status: DC | PRN
  Administered 2018-05-18: 50 mg via INTRAVENOUS

## 2018-05-18 MED ORDER — MIDAZOLAM HCL 2 MG/2ML IJ SOLN
1.0000 mg | INTRAMUSCULAR | Status: DC | PRN
  Administered 2018-05-18: 2 mg via INTRAVENOUS

## 2018-05-18 MED ORDER — HYDROMORPHONE HCL 1 MG/ML IJ SOLN: 0.2500 mg | INTRAMUSCULAR | Status: DC | PRN

## 2018-05-18 MED ORDER — HYDROCODONE-ACETAMINOPHEN 5-325 MG PO TABS: ORAL_TABLET | ORAL | 0 refills | Status: DC

## 2018-05-18 MED ORDER — CEFAZOLIN SODIUM-DEXTROSE 2-4 GM/100ML-% IV SOLN
INTRAVENOUS | Status: AC
  Filled 2018-05-18: qty 100

## 2018-05-18 MED ORDER — MEPERIDINE HCL 25 MG/ML IJ SOLN: 6.2500 mg | INTRAMUSCULAR | Status: DC | PRN

## 2018-05-18 MED ORDER — PROMETHAZINE HCL 25 MG/ML IJ SOLN: 6.2500 mg | INTRAMUSCULAR | Status: DC | PRN

## 2018-05-18 MED ORDER — MIDAZOLAM HCL 2 MG/2ML IJ SOLN
INTRAMUSCULAR | Status: AC
  Filled 2018-05-18: qty 2

## 2018-05-18 MED ORDER — LIDOCAINE HCL (CARDIAC) PF 100 MG/5ML IV SOSY
PREFILLED_SYRINGE | INTRAVENOUS | Status: DC | PRN
  Administered 2018-05-18: 30 mg via INTRAVENOUS

## 2018-05-18 MED ORDER — SCOPOLAMINE 1 MG/3DAYS TD PT72: 1.0000 | MEDICATED_PATCH | Freq: Once | TRANSDERMAL | Status: DC | PRN

## 2018-05-18 MED ORDER — LIDOCAINE HCL (PF) 0.5 % IJ SOLN
INTRAMUSCULAR | Status: DC | PRN
  Administered 2018-05-18: 30 mL via INTRAVENOUS

## 2018-05-18 MED ORDER — CEFAZOLIN SODIUM-DEXTROSE 2-4 GM/100ML-% IV SOLN
2.0000 g | INTRAVENOUS | Status: AC
  Administered 2018-05-18: 2 g via INTRAVENOUS

## 2018-05-18 MED ORDER — OXYCODONE HCL 5 MG PO TABS: 5.0000 mg | ORAL_TABLET | Freq: Once | ORAL | Status: DC | PRN

## 2018-05-18 MED ORDER — LACTATED RINGERS IV SOLN: INTRAVENOUS | Status: DC

## 2018-05-18 MED ORDER — OXYCODONE HCL 5 MG/5ML PO SOLN: 5.0000 mg | Freq: Once | ORAL | Status: DC | PRN

## 2018-05-18 MED ORDER — LACTATED RINGERS IV SOLN
INTRAVENOUS | Status: DC
  Administered 2018-05-18: 10 mL/h via INTRAVENOUS

## 2018-05-18 MED ORDER — BUPIVACAINE HCL (PF) 0.25 % IJ SOLN
INTRAMUSCULAR | Status: DC | PRN
  Administered 2018-05-18: 1 mL

## 2018-05-18 SURGICAL SUPPLY — 50 items
BANDAGE ACE 3X5.8 VEL STRL LF (GAUZE/BANDAGES/DRESSINGS) IMPLANT
BANDAGE COBAN STERILE 2 (GAUZE/BANDAGES/DRESSINGS) ×2 IMPLANT
BLADE MINI RND TIP GREEN BEAV (BLADE) IMPLANT
BLADE SURG 15 STRL LF DISP TIS (BLADE) ×2 IMPLANT
BLADE SURG 15 STRL SS (BLADE) ×2
BNDG COHESIVE 1X5 TAN STRL LF (GAUZE/BANDAGES/DRESSINGS) IMPLANT
BNDG ELASTIC 2X5.8 VLCR STR LF (GAUZE/BANDAGES/DRESSINGS) IMPLANT
BNDG ESMARK 4X9 LF (GAUZE/BANDAGES/DRESSINGS) IMPLANT
BNDG GAUZE ELAST 4 BULKY (GAUZE/BANDAGES/DRESSINGS) IMPLANT
CHLORAPREP W/TINT 26ML (MISCELLANEOUS) ×2 IMPLANT
CORD BIPOLAR FORCEPS 12FT (ELECTRODE) ×2 IMPLANT
COVER BACK TABLE 60X90IN (DRAPES) ×2 IMPLANT
COVER MAYO STAND STRL (DRAPES) ×2 IMPLANT
COVER WAND RF STERILE (DRAPES) IMPLANT
CUFF TOURNIQUET SINGLE 18IN (TOURNIQUET CUFF) ×2 IMPLANT
DRAPE EXTREMITY T 121X128X90 (DRAPE) ×2 IMPLANT
DRAPE SURG 17X23 STRL (DRAPES) ×2 IMPLANT
GAUZE SPONGE 4X4 12PLY STRL (GAUZE/BANDAGES/DRESSINGS) ×2 IMPLANT
GAUZE XEROFORM 1X8 LF (GAUZE/BANDAGES/DRESSINGS) ×2 IMPLANT
GLOVE BIO SURGEON STRL SZ7.5 (GLOVE) ×2 IMPLANT
GLOVE BIOGEL PI IND STRL 7.5 (GLOVE) ×1 IMPLANT
GLOVE BIOGEL PI IND STRL 8 (GLOVE) ×1 IMPLANT
GLOVE BIOGEL PI INDICATOR 7.5 (GLOVE) ×1
GLOVE BIOGEL PI INDICATOR 8 (GLOVE) ×1
GLOVE NEODERM STRL 7.5 LF PF (GLOVE) ×1 IMPLANT
GLOVE SURG NEODERM 7.5  LF PF (GLOVE) ×1
GOWN STRL REUS W/ TWL LRG LVL3 (GOWN DISPOSABLE) ×1 IMPLANT
GOWN STRL REUS W/TWL LRG LVL3 (GOWN DISPOSABLE) ×1
GOWN STRL REUS W/TWL XL LVL3 (GOWN DISPOSABLE) ×2 IMPLANT
LOOP VESSEL MAXI BLUE (MISCELLANEOUS) IMPLANT
NDL SAFETY ECLIPSE 18X1.5 (NEEDLE) IMPLANT
NEEDLE HYPO 18GX1.5 SHARP (NEEDLE)
NEEDLE HYPO 25X1 1.5 SAFETY (NEEDLE) IMPLANT
NS IRRIG 1000ML POUR BTL (IV SOLUTION) ×2 IMPLANT
PACK BASIN DAY SURGERY FS (CUSTOM PROCEDURE TRAY) ×2 IMPLANT
PAD CAST 3X4 CTTN HI CHSV (CAST SUPPLIES) IMPLANT
PADDING CAST ABS 4INX4YD NS (CAST SUPPLIES) ×1
PADDING CAST ABS COTTON 4X4 ST (CAST SUPPLIES) ×1 IMPLANT
PADDING CAST COTTON 3X4 STRL (CAST SUPPLIES)
SPLINT PLASTER CAST XFAST 3X15 (CAST SUPPLIES) IMPLANT
SPLINT PLASTER XTRA FASTSET 3X (CAST SUPPLIES)
STOCKINETTE 4X48 STRL (DRAPES) ×2 IMPLANT
SUT ETHILON 3 0 PS 1 (SUTURE) IMPLANT
SUT ETHILON 4 0 PS 2 18 (SUTURE) ×2 IMPLANT
SWAB COLLECTION DEVICE MRSA (MISCELLANEOUS) IMPLANT
SWAB CULTURE ESWAB REG 1ML (MISCELLANEOUS) IMPLANT
SYR BULB 3OZ (MISCELLANEOUS) ×2 IMPLANT
SYR CONTROL 10ML LL (SYRINGE) IMPLANT
TOWEL GREEN STERILE FF (TOWEL DISPOSABLE) ×4 IMPLANT
UNDERPAD 30X30 (UNDERPADS AND DIAPERS) ×2 IMPLANT

## 2018-05-18 NOTE — Anesthesia Procedure Notes (Signed)
Procedure Name: MAC Date/Time: 05/18/2018 2:30 PM Performed by: Signe Colt, CRNA Pre-anesthesia Checklist: Patient identified, Emergency Drugs available, Suction available, Patient being monitored and Timeout performed Patient Re-evaluated:Patient Re-evaluated prior to induction Oxygen Delivery Method: Simple face mask

## 2018-05-18 NOTE — Anesthesia Preprocedure Evaluation (Addendum)
Anesthesia Evaluation  Patient identified by MRN, date of birth, ID band Patient awake    Reviewed: Allergy & Precautions, NPO status , Patient's Chart, lab work & pertinent test results  Airway Mallampati: III  TM Distance: >3 FB Neck ROM: Full    Dental  (+) Teeth Intact, Dental Advisory Given   Pulmonary sleep apnea , Current Smoker,    breath sounds clear to auscultation       Cardiovascular hypertension, Pt. on medications  Rhythm:Regular Rate:Normal     Neuro/Psych  Neuromuscular disease    GI/Hepatic Neg liver ROS, GERD  Medicated,  Endo/Other  negative endocrine ROS  Renal/GU negative Renal ROS     Musculoskeletal  (+) Arthritis ,   Abdominal (+) + obese,   Peds  Hematology negative hematology ROS (+)   Anesthesia Other Findings - HLD  Reproductive/Obstetrics                            Lab Results  Component Value Date   WBC 7.9 10/03/2017   HGB 15.4 10/03/2017   HCT 45.7 10/03/2017   MCV 85.9 10/03/2017   PLT 253.0 10/03/2017   Lab Results  Component Value Date   CREATININE 0.81 10/03/2017   BUN 14 10/03/2017   NA 140 10/03/2017   K 4.5 10/03/2017   CL 108 10/03/2017   CO2 26 10/03/2017   No results found for: INR, PROTIME  EKG: sinus bradycardia.  Anesthesia Physical Anesthesia Plan  ASA: III  Anesthesia Plan: Bier Block and Bier Block-LIDOCAINE ONLY   Post-op Pain Management:    Induction: Intravenous  PONV Risk Score and Plan: 1 and Propofol infusion and Ondansetron  Airway Management Planned: Simple Face Mask  Additional Equipment: None  Intra-op Plan:   Post-operative Plan:   Informed Consent: I have reviewed the patients History and Physical, chart, labs and discussed the procedure including the risks, benefits and alternatives for the proposed anesthesia with the patient or authorized representative who has indicated his/her understanding and  acceptance.   Dental advisory given  Plan Discussed with: CRNA  Anesthesia Plan Comments:        Anesthesia Quick Evaluation

## 2018-05-18 NOTE — H&P (Signed)
  Micheal Mckinney is an 47 y.o. male.   Chief Complaint: right palm foreign body HPI: 47 yo male with foreign body right palm.  States a piece of brass went into palm ~ 2 weeks ago.  No fevers, chills, sweats.  He wishes to have it removed.  Allergies: No Known Allergies  Past Medical History:  Diagnosis Date  . Allergy    hay fever  . Foreign body (FB) in soft tissue    right palm  . GERD (gastroesophageal reflux disease)   . History of chicken pox   . Hyperlipidemia   . Hypertension   . OSA (obstructive sleep apnea) 01/26/2012   does not use CPAP    Past Surgical History:  Procedure Laterality Date  . CARPAL TUNNEL RELEASE Bilateral 05/04/2013  . CERVICAL FUSION     Alternative to this done in 05/2017, disc replacement  . IRRIGATION AND DEBRIDEMENT SEBACEOUS CYST  2009    on back and neck    Family History: Family History  Problem Relation Age of Onset  . Cancer Mother        breast    Social History:   reports that he has been smoking cigarettes. He has a 6.25 pack-year smoking history. He has never used smokeless tobacco. He reports that he does not drink alcohol or use drugs.  Medications: Medications Prior to Admission  Medication Sig Dispense Refill  . aspirin 81 MG tablet Take 81 mg by mouth daily.    Marland Kitchen atenolol (TENORMIN) 50 MG tablet TAKE ONE TABLET BY MOUTH DAILY 90 tablet 0  . ranitidine (ZANTAC) 75 MG tablet Take 75 mg by mouth at bedtime.    Marland Kitchen telmisartan (MICARDIS) 40 MG tablet TAKE 1 TABLET BY MOUTH ONCE DAILY 90 tablet 1    No results found for this or any previous visit (from the past 48 hour(s)).  No results found.   A comprehensive review of systems was negative.  Blood pressure 139/68, pulse (!) 56, temperature 98 F (36.7 C), temperature source Oral, resp. rate 18, height 5\' 9"  (1.753 m), weight (!) 136.4 kg, SpO2 98 %.  General appearance: alert, cooperative and appears stated age Head: Normocephalic, without obvious abnormality,  atraumatic Neck: supple, symmetrical, trachea midline Cardio: regular rate and rhythm Resp: clear to auscultation bilaterally Extremities: Intact sensation and capillary refill all digits.  +epl/fpl/io.  No wounds.  Pulses: 2+ and symmetric Skin: Skin color, texture, turgor normal. No rashes or lesions Neurologic: Grossly normal Incision/Wound: none  Assessment/Plan Right palm foreign body.  Non operative and operative treatment options have been discussed with the patient and patient wishes to proceed with operative treatment. Risks, benefits, and alternatives of surgery have been discussed and the patient agrees with the plan of care.   Betha Loa 05/18/2018, 1:22 PM

## 2018-05-18 NOTE — Anesthesia Postprocedure Evaluation (Signed)
Anesthesia Post Note  Patient: Micheal Mckinney  Procedure(s) Performed: REMOVAL FOREIGN BODY RIGHT PALM (Right Hand)     Patient location during evaluation: PACU Anesthesia Type: Bier Block Level of consciousness: awake and alert Pain management: pain level controlled Vital Signs Assessment: post-procedure vital signs reviewed and stable Respiratory status: spontaneous breathing, nonlabored ventilation, respiratory function stable and patient connected to nasal cannula oxygen Cardiovascular status: stable and blood pressure returned to baseline Postop Assessment: no apparent nausea or vomiting Anesthetic complications: no    Last Vitals:  Vitals:   05/18/18 1500 05/18/18 1519  BP:  128/77  Pulse: (!) 52 (!) 53  Resp: 14 18  Temp:  36.6 C  SpO2: 98% 100%    Last Pain:  Vitals:   05/18/18 1519  TempSrc:   PainSc: 0-No pain                 Shelton Silvas

## 2018-05-18 NOTE — Transfer of Care (Signed)
Immediate Anesthesia Transfer of Care Note  Patient: [name]  Procedure(s) Performed: REMOVAL FOREIGN BODY RIGHT PALM (Right Hand)  Patient Location: PACU  Anesthesia Type:Bier block  Level of Consciousness: awake, alert , oriented and patient cooperative  Airway & Oxygen Therapy: Patient Spontanous Breathing and Patient connected to face mask oxygen  Post-op Assessment: Report given to RN and Post -op Vital signs reviewed and stable  Post vital signs: Reviewed and stable  Last Vitals:  Vitals Value Taken Time  BP    Temp    Pulse 64 05/18/2018  2:47 PM  Resp    SpO2 97 % 05/18/2018  2:47 PM  Vitals shown include unvalidated device data.  Last Pain:  Vitals:   05/18/18 1304  TempSrc: Oral  PainSc: 0-No pain      Patients Stated Pain Goal: 3 (05/18/18 1304)  Complications: No apparent anesthesia complications

## 2018-05-18 NOTE — Anesthesia Procedure Notes (Signed)
Anesthesia Regional Block: Bier block (IV Regional)   Pre-Anesthetic Checklist: ,, timeout performed, Correct Patient, Correct Site, Correct Laterality, Correct Procedure,, site marked, surgical consent,, at surgeon's request  Laterality: Right     Needles:  Injection technique: Single-shot  Needle Type: Other      Needle Gauge: 22     Additional Needles:   Procedures:,,,,, intact distal pulses, Esmarch exsanguination, single tourniquet utilized,  Narrative:   Performed by: Personally       

## 2018-05-18 NOTE — Op Note (Signed)
NAME: Micheal Mckinney [facility] MEDICAL RECORD NO: [id number] DATE OF BIRTH: [birth year] FACILITY: [facility] LOCATION: [facility] PHYSICIAN: Tami Ribas, MD   OPERATIVE REPORT   DATE OF PROCEDURE: 05/18/18    PREOPERATIVE DIAGNOSIS:   Right palm foreign body   POSTOPERATIVE DIAGNOSIS:   Right palm foreign body   PROCEDURE:   Removal deep foreign body right palm   SURGEON:  Betha Loa, M.D.   ASSISTANT: none   ANESTHESIA:  Bier block with sedation   INTRAVENOUS FLUIDS:  Per anesthesia flow sheet.   ESTIMATED BLOOD LOSS:  Minimal.   COMPLICATIONS:  None.   SPECIMENS:   Cultures to micro; foreign body to patient   TOURNIQUET TIME:   Right forearm: 26 minutes at 250 mmHg   DISPOSITION:  Stable to PACU.   INDICATIONS: 47 year old male states he got a piece of grass in the right palm proximally 2 weeks ago.  This is bothersome especially with grip.  He wishes to have it removed. Risks, benefits and alternatives of surgery were discussed including the risks of blood loss, infection, damage to nerves, vessels, tendons, ligaments, bone for surgery, need for additional surgery, complications with wound healing, continued pain, nonunion, malunion, stiffness.  He voiced understanding of these risks and elected to proceed.  OPERATIVE COURSE:  After being identified preoperatively by myself,  the patient and I agreed on the procedure and site of the procedure.  The surgical site was marked.  Surgical consent had been signed. He was given IV Ancef as preoperative antibiotic prophylaxis. He was transferred to the operating room and placed on the operating table in supine position with the Right upper extremity on an arm board.  Bier block anesthesia was induced by the anesthesiologist.  Right upper extremity was prepped and draped in normal sterile orthopedic fashion.  A surgical pause was performed between the surgeons, anesthesia, and operating room staff and all were in agreement  as to the patient, procedure, and site of procedure.  Tourniquet at the proximal aspect of the forearm had been inflated for the Bier block.    Incision was made in the palm over the scar from the foreign body.  This is carried in subtenons tissues by spreading technique.  There is a small foreign body granuloma encountered.  This was removed.  The foreign body was identified.  Was metallic in nature.  It went through the fascia.  This was removed.  He was given to the patient per his request.  C-arm was used in AP and lateral projections throughout the case to aid in localization of foreign body and confirmation of removal of radiopaque foreign body.  The wound was copiously irrigated with sterile saline after culture swabs have been taken.  There was no gross purulence or sign of infection.  The wound was then closed with 4-0 nylon in a horizontal mattress fashion.  It was dressed with sterile Xeroform 4 x 4 and wrapped with a Coban dressing lightly.  The tourniquet was deflated at 26 minutes.  Fingertips were pink with brisk capillary refill after deflation of tourniquet.  The operative  drapes were broken down.  The patient was awoken from anesthesia safely.  He was transferred back to the stretcher and taken to PACU in stable condition.  I will see him back in the office in 1 week for postoperative followup.  I will give him a prescription for Norco 5/325 1-2 tabs PO q6 hours prn pain, dispense # 10.   Caryn Bee  Merlyn Lot, MD Electronically signed, 05/18/18

## 2018-05-18 NOTE — Discharge Instructions (Addendum)

## 2018-05-19 ENCOUNTER — Encounter (HOSPITAL_BASED_OUTPATIENT_CLINIC_OR_DEPARTMENT_OTHER): Payer: Self-pay | Admitting: Orthopedic Surgery

## 2018-05-23 LAB — AEROBIC/ANAEROBIC CULTURE W GRAM STAIN (SURGICAL/DEEP WOUND)
Culture: NO GROWTH
Gram Stain: NONE SEEN

## 2018-05-23 LAB — AEROBIC/ANAEROBIC CULTURE (SURGICAL/DEEP WOUND)

## 2018-07-28 ENCOUNTER — Other Ambulatory Visit: Payer: Self-pay | Admitting: Physician Assistant

## 2018-08-26 ENCOUNTER — Other Ambulatory Visit: Payer: Self-pay | Admitting: Physician Assistant

## 2018-09-24 ENCOUNTER — Other Ambulatory Visit: Payer: Self-pay | Admitting: Physician Assistant

## 2018-09-24 ENCOUNTER — Other Ambulatory Visit: Payer: Self-pay

## 2018-09-24 ENCOUNTER — Encounter: Payer: Self-pay | Admitting: Physician Assistant

## 2018-09-24 ENCOUNTER — Ambulatory Visit: Payer: 59 | Admitting: Physician Assistant

## 2018-09-24 DIAGNOSIS — I10 Essential (primary) hypertension: Secondary | ICD-10-CM

## 2018-09-24 DIAGNOSIS — M5412 Radiculopathy, cervical region: Secondary | ICD-10-CM | POA: Diagnosis not present

## 2018-09-24 DIAGNOSIS — M50123 Cervical disc disorder at C6-C7 level with radiculopathy: Secondary | ICD-10-CM | POA: Diagnosis not present

## 2018-09-24 MED ORDER — TELMISARTAN 40 MG PO TABS: ORAL_TABLET | ORAL | 1 refills | Status: DC

## 2018-09-24 MED ORDER — ATENOLOL 50 MG PO TABS: 50.0000 mg | ORAL_TABLET | Freq: Every day | ORAL | 1 refills | Status: DC

## 2018-09-24 NOTE — Progress Notes (Signed)
Patient presents to clinic today for follow-up of hypertension. Patient is currently on a regimen of Micardis and Atenolol. Endorses taking medications as directed. Patient denies chest pain, palpitations, lightheadedness, dizziness, vision changes or frequent headaches. Has been trying to keep a well-balanced diet. Denies any regular activity currently. Is overdue for physical but does not have time today nor is fasting for labs.  Past Medical History:  Diagnosis Date  . Allergy    hay fever  . Foreign body (FB) in soft tissue    right palm  . GERD (gastroesophageal reflux disease)   . History of chicken pox   . Hyperlipidemia   . Hypertension   . OSA (obstructive sleep apnea) 01/26/2012   does not use CPAP    Current Outpatient Medications on File Prior to Visit  Medication Sig Dispense Refill  . aspirin 81 MG tablet Take 81 mg by mouth daily.    Marland Kitchen atenolol (TENORMIN) 50 MG tablet TAKE ONE TABLET BY MOUTH DAILY 90 tablet 0  . ranitidine (ZANTAC) 75 MG tablet Take 75 mg by mouth at bedtime.    Marland Kitchen telmisartan (MICARDIS) 40 MG tablet TAKE ONE TABLET BY MOUTH DAILY OVERDUE FOR CHOLESTEROL APPT. PLEASE CALL TO SCHEDULE [phone] 30 tablet 1   No current facility-administered medications on file prior to visit.     No Known Allergies  Family History  Problem Relation Age of Onset  . Cancer Mother        breast    Social History   Socioeconomic History  . Marital status: Married    Spouse name: Not on file  . Number of children: Not on file  . Years of education: Not on file  . Highest education level: Not on file  Occupational History  . Not on file  Social Needs  . Financial resource strain: Not on file  . Food insecurity:    Worry: Not on file    Inability: Not on file  . Transportation needs:    Medical: Not on file    Non-medical: Not on file  Tobacco Use  . Smoking status: Current Some Day Smoker    Packs/day: 0.25    Years: 25.00    Pack years: 6.25     Types: Cigarettes    Last attempt to quit: 06/17/2012    Years since quitting: 6.2  . Smokeless tobacco: Never Used  Substance and Sexual Activity  . Alcohol use: No    Alcohol/week: 0.0 standard drinks  . Drug use: No  . Sexual activity: Yes  Lifestyle  . Physical activity:    Days per week: Not on file    Minutes per session: Not on file  . Stress: Not on file  Relationships  . Social connections:    Talks on phone: Not on file    Gets together: Not on file    Attends religious service: Not on file    Active member of club or organization: Not on file    Attends meetings of clubs or organizations: Not on file    Relationship status: Not on file  Other Topics Concern  . Not on file  Social History Narrative   Lives with wife and dogs   Review of Systems - See HPI.  All other ROS are negative.  BP 122/80   Pulse 63   Temp 98.1 F (36.7 C) (Oral)   Resp 16   Ht 5\' 9"  (1.753 m)   Wt (!) 309 lb (140.2 kg)  SpO2 99%   BMI 45.63 kg/m   Physical Exam Vitals signs reviewed.  Constitutional:      Appearance: Normal appearance.  HENT:     Head: Normocephalic and atraumatic.  Eyes:     Conjunctiva/sclera: Conjunctivae normal.     Pupils: Pupils are equal, round, and reactive to light.  Neck:     Musculoskeletal: Neck supple.  Cardiovascular:     Rate and Rhythm: Normal rate and regular rhythm.     Pulses: Normal pulses.     Heart sounds: Normal heart sounds.  Pulmonary:     Effort: Pulmonary effort is normal.     Breath sounds: Normal breath sounds.  Neurological:     General: No focal deficit present.     Mental Status: He is alert and oriented to person, place, and time.  Psychiatric:        Mood and Affect: Mood normal.    Assessment/Plan: HTN (hypertension) BP normotensive. Asymptomatic. Discussed need to work harder on diet and exercise for weight loss. Recommendations reviewed with patient. He is to follow-up in 3-4 weeks when able for CPE. Will  obtain fasting labs at that time.     Piedad Climes, PA-C

## 2018-09-24 NOTE — Assessment & Plan Note (Signed)
BP normotensive. Asymptomatic. Discussed need to work harder on diet and exercise for weight loss. Recommendations reviewed with patient. He is to follow-up in 3-4 weeks when able for CPE. Will obtain fasting labs at that time.

## 2018-09-29 ENCOUNTER — Telehealth: Payer: Self-pay | Admitting: Physician Assistant

## 2018-09-29 DIAGNOSIS — I10 Essential (primary) hypertension: Secondary | ICD-10-CM

## 2018-09-29 MED ORDER — ATENOLOL 50 MG PO TABS: 50.0000 mg | ORAL_TABLET | Freq: Every day | ORAL | 1 refills | Status: DC

## 2018-09-29 MED ORDER — TELMISARTAN 40 MG PO TABS: ORAL_TABLET | ORAL | 1 refills | Status: DC

## 2018-09-29 NOTE — Telephone Encounter (Signed)
Advised patient's wife that medications were sent to the [facility] pharmacy

## 2018-09-29 NOTE — Telephone Encounter (Signed)
Wife called in stating that pt's medications need to go to [facility] in [place]. It was sent to [facility] last week.

## 2018-10-07 DIAGNOSIS — M542 Cervicalgia: Secondary | ICD-10-CM | POA: Diagnosis not present

## 2018-10-07 DIAGNOSIS — Z6841 Body Mass Index (BMI) 40.0 and over, adult: Secondary | ICD-10-CM | POA: Diagnosis not present

## 2018-10-07 DIAGNOSIS — M5412 Radiculopathy, cervical region: Secondary | ICD-10-CM | POA: Diagnosis not present

## 2018-10-27 ENCOUNTER — Encounter: Payer: Self-pay | Admitting: Physician Assistant

## 2018-12-09 ENCOUNTER — Encounter: Payer: Self-pay | Admitting: Physician Assistant

## 2019-01-12 ENCOUNTER — Ambulatory Visit (INDEPENDENT_AMBULATORY_CARE_PROVIDER_SITE_OTHER): Payer: 59 | Admitting: Physician Assistant

## 2019-01-12 ENCOUNTER — Encounter: Payer: Self-pay | Admitting: Physician Assistant

## 2019-01-12 ENCOUNTER — Other Ambulatory Visit: Payer: Self-pay

## 2019-01-12 VITALS — BP 122/82 | HR 68 | Temp 98.1°F | Resp 16 | Ht 70.0 in | Wt 308.6 lb

## 2019-01-12 DIAGNOSIS — M1712 Unilateral primary osteoarthritis, left knee: Secondary | ICD-10-CM

## 2019-01-12 DIAGNOSIS — I10 Essential (primary) hypertension: Secondary | ICD-10-CM | POA: Diagnosis not present

## 2019-01-12 DIAGNOSIS — Z0000 Encounter for general adult medical examination without abnormal findings: Secondary | ICD-10-CM

## 2019-01-12 DIAGNOSIS — Z125 Encounter for screening for malignant neoplasm of prostate: Secondary | ICD-10-CM

## 2019-01-12 DIAGNOSIS — G4733 Obstructive sleep apnea (adult) (pediatric): Secondary | ICD-10-CM | POA: Diagnosis not present

## 2019-01-12 DIAGNOSIS — K219 Gastro-esophageal reflux disease without esophagitis: Secondary | ICD-10-CM

## 2019-01-12 DIAGNOSIS — E785 Hyperlipidemia, unspecified: Secondary | ICD-10-CM | POA: Diagnosis not present

## 2019-01-12 LAB — COMPREHENSIVE METABOLIC PANEL
ALT: 20 U/L (ref 0–53)
AST: 15 U/L (ref 0–37)
Albumin: 4.2 g/dL (ref 3.5–5.2)
Alkaline Phosphatase: 59 U/L (ref 39–117)
BUN: 16 mg/dL (ref 6–23)
CO2: 24 mEq/L (ref 19–32)
Calcium: 10.1 mg/dL (ref 8.4–10.5)
Chloride: 107 mEq/L (ref 96–112)
Creatinine, Ser: 0.84 mg/dL (ref 0.40–1.50)
GFR: 97.43 mL/min (ref >60.00)
Glucose, Bld: 107 mg/dL — ABNORMAL HIGH (ref 70–99)
Potassium: 4.4 mEq/L (ref 3.5–5.1)
Sodium: 139 mEq/L (ref 135–145)
Total Bilirubin: 0.5 mg/dL (ref 0.2–1.2)
Total Protein: 6.5 g/dL (ref 6.0–8.3)

## 2019-01-12 LAB — LIPID PANEL
Cholesterol: 225 mg/dL — ABNORMAL HIGH (ref 0–200)
HDL: 28.8 mg/dL — ABNORMAL LOW (ref >39.00)
LDL Cholesterol: 168 mg/dL — ABNORMAL HIGH (ref 0–99)
NonHDL: 196.36
Total CHOL/HDL Ratio: 8
Triglycerides: 141 mg/dL (ref 0.0–149.0)
VLDL: 28.2 mg/dL (ref 0.0–40.0)

## 2019-01-12 LAB — URINALYSIS, ROUTINE W REFLEX MICROSCOPIC
Bilirubin Urine: NEGATIVE
Hgb urine dipstick: NEGATIVE
Ketones, ur: NEGATIVE
Leukocytes,Ua: NEGATIVE
Nitrite: NEGATIVE
RBC / HPF: NONE SEEN (ref 0–?)
Specific Gravity, Urine: >=1.03 — AB (ref 1.000–1.030)
Total Protein, Urine: NEGATIVE
Urine Glucose: NEGATIVE
Urobilinogen, UA: 0.2 (ref 0.0–1.0)
pH: 5 (ref 5.0–8.0)

## 2019-01-12 LAB — CBC WITH DIFFERENTIAL/PLATELET
Basophils Absolute: 0.1 10*3/uL (ref 0.0–0.1)
Basophils Relative: 1 % (ref 0.0–3.0)
Eosinophils Absolute: 0.1 10*3/uL (ref 0.0–0.7)
Eosinophils Relative: 1.3 % (ref 0.0–5.0)
HCT: 44.4 % (ref 39.0–52.0)
Hemoglobin: 15 g/dL (ref 13.0–17.0)
Lymphocytes Relative: 31.9 % (ref 12.0–46.0)
Lymphs Abs: 2.7 10*3/uL (ref 0.7–4.0)
MCHC: 33.7 g/dL (ref 30.0–36.0)
MCV: 88.2 fl (ref 78.0–100.0)
Monocytes Absolute: 0.6 10*3/uL (ref 0.1–1.0)
Monocytes Relative: 6.7 % (ref 3.0–12.0)
Neutro Abs: 5 10*3/uL (ref 1.4–7.7)
Neutrophils Relative %: 59.1 % (ref 43.0–77.0)
Platelets: 244 10*3/uL (ref 150.0–400.0)
RBC: 5.04 Mil/uL (ref 4.22–5.81)
RDW: 13.8 % (ref 11.5–15.5)
WBC: 8.5 10*3/uL (ref 4.0–10.5)

## 2019-01-12 LAB — HEMOGLOBIN A1C: Hgb A1c MFr Bld: 5.8 % (ref 4.6–6.5)

## 2019-01-12 LAB — PSA: PSA: 0.27 ng/mL (ref 0.10–4.00)

## 2019-01-12 NOTE — Patient Instructions (Addendum)
Please go to the lab for blood work.  Our office will call you with your results unless you have chosen to receive results via MyChart. If your blood work is normal we will follow-up each year for physicals and as scheduled for chronic medical problems. If anything is abnormal we will treat accordingly and get you in for a follow-up.  For the knee, try to rest and elevate when you are able. Apply topical OTC Voltaren gel to the area to help with pain. Tylenol or Ibuprofen if needed.  We will continue to work on diet and exercise to help prevent recurrences. If not improving we will proceed with imaging.     Preventive Care 99-73 Years Old, Male Preventive care refers to lifestyle choices and visits with your health care provider that can promote health and wellness. This includes:  A yearly physical exam. This is also called an annual well check.  Regular dental and eye exams.  Immunizations.  Screening for certain conditions.  Healthy lifestyle choices, such as eating a healthy diet, getting regular exercise, not using drugs or products that contain nicotine and tobacco, and limiting alcohol use. What can I expect for my preventive care visit? Physical exam Your health care provider will check:  Height and weight. These may be used to calculate body mass index (BMI), which is a measurement that tells if you are at a healthy weight.  Heart rate and blood pressure.  Your skin for abnormal spots. Counseling Your health care provider may ask you questions about:  Alcohol, tobacco, and drug use.  Emotional well-being.  Home and relationship well-being.  Sexual activity.  Eating habits.  Work and work Statistician. What immunizations do I need?  Influenza (flu) vaccine  This is recommended every year. Tetanus, diphtheria, and pertussis (Tdap) vaccine  You may need a Td booster every 10 years. Varicella (chickenpox) vaccine  You may need this vaccine if you have not  already been vaccinated. Zoster (shingles) vaccine  You may need this after age 96. Measles, mumps, and rubella (MMR) vaccine  You may need at least one dose of MMR if you were born in 1957 or later. You may also need a second dose. Pneumococcal conjugate (PCV13) vaccine  You may need this if you have certain conditions and were not previously vaccinated. Pneumococcal polysaccharide (PPSV23) vaccine  You may need one or two doses if you smoke cigarettes or if you have certain conditions. Meningococcal conjugate (MenACWY) vaccine  You may need this if you have certain conditions. Hepatitis A vaccine  You may need this if you have certain conditions or if you travel or work in places where you may be exposed to hepatitis A. Hepatitis B vaccine  You may need this if you have certain conditions or if you travel or work in places where you may be exposed to hepatitis B. Haemophilus influenzae type b (Hib) vaccine  You may need this if you have certain risk factors. Human papillomavirus (HPV) vaccine  If recommended by your health care provider, you may need three doses over 6 months. You may receive vaccines as individual doses or as more than one vaccine together in one shot (combination vaccines). Talk with your health care provider about the risks and benefits of combination vaccines. What tests do I need? Blood tests  Lipid and cholesterol levels. These may be checked every 5 years, or more frequently if you are over 109 years old.  Hepatitis C test.  Hepatitis B test. Screening  Lung cancer screening. You may have this screening every year starting at age 74 if you have a 30-pack-year history of smoking and currently smoke or have quit within the past 15 years.  Prostate cancer screening. Recommendations will vary depending on your family history and other risks.  Colorectal cancer screening. All adults should have this screening starting at age 86 and continuing until age  75. Your health care provider may recommend screening at age 5 if you are at increased risk. You will have tests every 1-10 years, depending on your results and the type of screening test.  Diabetes screening. This is done by checking your blood sugar (glucose) after you have not eaten for a while (fasting). You may have this done every 1-3 years.  Sexually transmitted disease (STD) testing. Follow these instructions at home: Eating and drinking  Eat a diet that includes fresh fruits and vegetables, whole grains, lean protein, and low-fat dairy products.  Take vitamin and mineral supplements as recommended by your health care provider.  Do not drink alcohol if your health care provider tells you not to drink.  If you drink alcohol: ? Limit how much you have to 0-2 drinks a day. ? Be aware of how much alcohol is in your drink. In the [place] one drink equals one 12 oz bottle of beer (355 mL), one 5 oz glass of wine (148 mL), or one 1 oz glass of hard liquor (44 mL). Lifestyle  Take daily care of your teeth and gums.  Stay active. Exercise for at least 30 minutes on 5 or more days each week.  Do not use any products that contain nicotine or tobacco, such as cigarettes, e-cigarettes, and chewing tobacco. If you need help quitting, ask your health care provider.  If you are sexually active, practice safe sex. Use a condom or other form of protection to prevent STIs (sexually transmitted infections).  Talk with your health care provider about taking a low-dose aspirin every day starting at age 77. What's next?  Go to your health care provider once a year for a well check visit.  Ask your health care provider how often you should have your eyes and teeth checked.  Stay up to date on all vaccines. This information is not intended to replace advice given to you by your health care provider. Make sure you discuss any questions you have with your health care provider. Document Released:  07/28/2015 Document Revised: 06/25/2018 Document Reviewed: 06/25/2018 Elsevier Patient Education  2020 [employer].

## 2019-01-12 NOTE — Progress Notes (Signed)
Patient presents to clinic today for annual exam.  Patient is fasting for labs.  Acute Concerns: Patient endorses longstanding history of intermittent pain in left medial knee since younger age. Notes very rare flares of pain but has noted some mild pain in knee x 3 days. Denies trauma or injury. Does heavy lifting and makes sure to bend with knees. Denies swelling, redness, numbness or tingling.   Chronic Issues: Hypertension, Hyperlipidemia, OSA -- Currently on regimen of Atenolol 50 mg QD, Telmisartan 40 mg daily .  Takes medications daily, does not check BP at home. Patient denies chest pain, palpitations, lightheadedness, dizziness, vision changes or frequent headaches.  Obesity -- Body mass index is 44.28 kg/m. Eats 2 meals a day, meals made at home, cut back on bread, reports well balanced, working on cutting back on portion. Working towards exercise routine, elliptical machine   GERD -- Takes OTC Pepcid, no issues since starting med. Denies cough, heart burn, indigestion   Health Maintenance: Immunizations -- up-to-date.  Past Medical History:  Diagnosis Date  . Allergy    hay fever  . Foreign body (FB) in soft tissue    right palm  . GERD (gastroesophageal reflux disease)   . History of chicken pox   . Hyperlipidemia   . Hypertension   . OSA (obstructive sleep apnea) 01/26/2012   does not use CPAP    Past Surgical History:  Procedure Laterality Date  . CARPAL TUNNEL RELEASE Bilateral 05/04/2013  . CERVICAL FUSION     Alternative to this done in 05/2017, disc replacement  . FOREIGN BODY REMOVAL Right 05/18/2018   Procedure: REMOVAL FOREIGN BODY RIGHT PALM;  Surgeon: Betha LoaKuzma, Kevin, MD;  Location: [facility];  Service: Orthopedics;  Laterality: Right;  . IRRIGATION AND DEBRIDEMENT SEBACEOUS CYST  2009    on back and neck    Current Outpatient Medications on File Prior to Visit  Medication Sig Dispense Refill  . aspirin 81 MG tablet Take 81 mg by  mouth daily.    Marland Kitchen. atenolol (TENORMIN) 50 MG tablet Take 1 tablet (50 mg total) by mouth daily. 90 tablet 1  . famotidine (PEPCID) 40 MG tablet Take 40 mg by mouth daily.    Marland Kitchen. telmisartan (MICARDIS) 40 MG tablet TAKE ONE TABLET BY MOUTH DAILY 90 tablet 1   No current facility-administered medications on file prior to visit.     No Known Allergies  Family History  Problem Relation Age of Onset  . Cancer Mother        breast    Social History   Socioeconomic History  . Marital status: Married    Spouse name: Not on file  . Number of children: Not on file  . Years of education: Not on file  . Highest education level: Not on file  Occupational History  . Not on file  Social Needs  . Financial resource strain: Not on file  . Food insecurity    Worry: Not on file    Inability: Not on file  . Transportation needs    Medical: Not on file    Non-medical: Not on file  Tobacco Use  . Smoking status: Current Some Day Smoker    Packs/day: 0.25    Years: 25.00    Pack years: 6.25    Types: Cigarettes    Last attempt to quit: 06/17/2012    Years since quitting: 6.5  . Smokeless tobacco: Never Used  Substance and Sexual Activity  . Alcohol  use: No    Alcohol/week: 0.0 standard drinks  . Drug use: No  . Sexual activity: Yes  Lifestyle  . Physical activity    Days per week: Not on file    Minutes per session: Not on file  . Stress: Not on file  Relationships  . Social Musicianconnections    Talks on phone: Not on file    Gets together: Not on file    Attends religious service: Not on file    Active member of club or organization: Not on file    Attends meetings of clubs or organizations: Not on file    Relationship status: Not on file  . Intimate partner violence    Fear of current or ex partner: Not on file    Emotionally abused: Not on file    Physically abused: Not on file    Forced sexual activity: Not on file  Other Topics Concern  . Not on file  Social History Narrative    Lives with wife and dogs   Review of Systems  Constitutional: Negative for fever and weight loss.  HENT: Negative for ear discharge, ear pain, hearing loss and tinnitus.   Eyes: Negative for blurred vision, double vision, photophobia and pain.  Respiratory: Negative for cough and shortness of breath.   Cardiovascular: Negative for chest pain and palpitations.  Gastrointestinal: Negative for abdominal pain, blood in stool, constipation, diarrhea, heartburn, melena, nausea and vomiting.  Genitourinary: Negative for dysuria, flank pain, frequency, hematuria and urgency.  Musculoskeletal: Negative for falls.  Neurological: Negative for dizziness, loss of consciousness and headaches.  Endo/Heme/Allergies: Negative for environmental allergies.  Psychiatric/Behavioral: Negative for depression, hallucinations, substance abuse and suicidal ideas. The patient is not nervous/anxious and does not have insomnia.    BP 122/82   Pulse 68   Temp 98.1 F (36.7 C) (Skin)   Resp 16   Ht 5\' 10"  (1.778 m)   Wt (!) 308 lb 9.6 oz (140 kg)   SpO2 98%   BMI 44.28 kg/m   Physical Exam Vitals signs reviewed.  Constitutional:      General: He is not in acute distress.    Appearance: He is well-developed. He is not diaphoretic.  HENT:     Head: Normocephalic and atraumatic.     Right Ear: Tympanic membrane, ear canal and external ear normal.     Left Ear: Tympanic membrane, ear canal and external ear normal.     Nose: Nose normal.     Mouth/Throat:     Pharynx: No posterior oropharyngeal erythema.  Eyes:     Conjunctiva/sclera: Conjunctivae normal.     Pupils: Pupils are equal, round, and reactive to light.  Neck:     Musculoskeletal: Neck supple.     Thyroid: No thyromegaly.  Cardiovascular:     Rate and Rhythm: Normal rate and regular rhythm.     Heart sounds: Normal heart sounds.  Pulmonary:     Effort: Pulmonary effort is normal. No respiratory distress.     Breath sounds: Normal breath  sounds. No wheezing or rales.  Chest:     Chest wall: No tenderness.  Abdominal:     General: Bowel sounds are normal. There is no distension.     Palpations: Abdomen is soft. There is no mass.     Tenderness: There is no abdominal tenderness. There is no guarding or rebound.  Lymphadenopathy:     Cervical: No cervical adenopathy.  Skin:    General: Skin is warm and  dry.     Findings: No rash.  Neurological:     Mental Status: He is alert and oriented to person, place, and time.     Cranial Nerves: No cranial nerve deficit.    Assessment/Plan: 1. Visit for preventive health examination Depression screen negative. Health Maintenance reviewed. Preventive schedule discussed and handout given in AVS. Will obtain fasting labs today. - CBC with Differential/Platelet - Comprehensive metabolic panel - Hemoglobin A1c - Lipid panel - Urinalysis, Routine w reflex microscopic  2. Essential hypertension Normotensive. Asymptomatic. Labs today. Continue current regimen.  - Comprehensive metabolic panel - Hemoglobin A1c  3. OSA (obstructive sleep apnea) Unable to tolerate CPAP with different masks or BiPAP. Is working on weight loss via diet and exercise. Will monitor closely.   4. Hyperlipidemia, unspecified hyperlipidemia type Dietary and exercise recommendations reviewed. Fasting lipids today. - Lipid panel  5. Gastroesophageal reflux disease without esophagitis Stable. Continue as directed.  6. Morbid obesity ([place]) Body mass index is 44.28 kg/m. Fasting labs today. Dietary and exercise recommendations reviewed. He is gong to work on getting up to 150 minutes of exercise per week. Is buying an elliptical.   7. Prostate cancer screening The natural history of prostate cancer and ongoing controversy regarding screening and potential treatment outcomes of prostate cancer has been discussed with the patient. The meaning of a false positive PSA and a false negative PSA has been  discussed. He indicates understanding of the limitations of this screening test and wishes to proceed with screening PSA testing. - PSA  8. Primary osteoarthritis of left knee Issue since younger age. Intermittent flares of medial knee pain related to physical activity. Exam unremarkable today. No recent trauma. Negative meniscal signs. Start rest and elevation. OTC topical Voltaren gel. Will work on diet and exercise to promote weight loss as that is likely a major contributor to this flares and would be cause of worsening arthritic pain over time. If not easing up will proceed with further assessment.    Leeanne Rio, PA-C

## 2019-01-13 ENCOUNTER — Other Ambulatory Visit: Payer: Self-pay

## 2019-01-13 DIAGNOSIS — I10 Essential (primary) hypertension: Secondary | ICD-10-CM

## 2019-01-14 ENCOUNTER — Other Ambulatory Visit: Payer: Self-pay

## 2019-01-14 MED ORDER — EZETIMIBE 10 MG PO TABS: 10.0000 mg | ORAL_TABLET | Freq: Every day | ORAL | 1 refills | Status: DC

## 2019-01-24 ENCOUNTER — Other Ambulatory Visit: Payer: Self-pay | Admitting: Physician Assistant

## 2019-01-24 DIAGNOSIS — I10 Essential (primary) hypertension: Secondary | ICD-10-CM

## 2019-03-05 ENCOUNTER — Other Ambulatory Visit: Payer: Self-pay

## 2019-03-05 ENCOUNTER — Ambulatory Visit (INDEPENDENT_AMBULATORY_CARE_PROVIDER_SITE_OTHER): Payer: 59

## 2019-03-05 DIAGNOSIS — I10 Essential (primary) hypertension: Secondary | ICD-10-CM

## 2019-03-05 LAB — LIPID PANEL
Cholesterol: 223 mg/dL — ABNORMAL HIGH (ref 0–200)
HDL: 30.1 mg/dL — ABNORMAL LOW (ref >39.00)
LDL Cholesterol: 161 mg/dL — ABNORMAL HIGH (ref 0–99)
NonHDL: 193.15
Total CHOL/HDL Ratio: 7
Triglycerides: 162 mg/dL — ABNORMAL HIGH (ref 0.0–149.0)
VLDL: 32.4 mg/dL (ref 0.0–40.0)

## 2019-03-05 LAB — HEPATIC FUNCTION PANEL
ALT: 24 U/L (ref 0–53)
AST: 19 U/L (ref 0–37)
Albumin: 4.6 g/dL (ref 3.5–5.2)
Alkaline Phosphatase: 60 U/L (ref 39–117)
Bilirubin, Direct: 0.1 mg/dL (ref 0.0–0.3)
Total Bilirubin: 0.7 mg/dL (ref 0.2–1.2)
Total Protein: 6.9 g/dL (ref 6.0–8.3)

## 2019-03-15 ENCOUNTER — Other Ambulatory Visit: Payer: Self-pay | Admitting: Physician Assistant

## 2019-04-17 ENCOUNTER — Other Ambulatory Visit: Payer: Self-pay | Admitting: Physician Assistant

## 2019-04-17 DIAGNOSIS — I10 Essential (primary) hypertension: Secondary | ICD-10-CM

## 2019-06-19 ENCOUNTER — Other Ambulatory Visit: Payer: Self-pay | Admitting: Physician Assistant

## 2019-07-17 ENCOUNTER — Other Ambulatory Visit: Payer: Self-pay | Admitting: Physician Assistant

## 2019-07-17 DIAGNOSIS — I10 Essential (primary) hypertension: Secondary | ICD-10-CM
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
## 2019-10-18 ENCOUNTER — Other Ambulatory Visit: Payer: Self-pay | Admitting: Physician Assistant

## 2019-10-18 DIAGNOSIS — I10 Essential (primary) hypertension: Secondary | ICD-10-CM

## 2019-11-17 ENCOUNTER — Other Ambulatory Visit: Payer: Self-pay | Admitting: Physician Assistant

## 2019-11-17 DIAGNOSIS — I10 Essential (primary) hypertension: Secondary | ICD-10-CM

## 2019-11-21 ENCOUNTER — Other Ambulatory Visit: Payer: Self-pay | Admitting: Physician Assistant

## 2019-11-21 DIAGNOSIS — I10 Essential (primary) hypertension: Secondary | ICD-10-CM

## 2019-11-24 ENCOUNTER — Telehealth (INDEPENDENT_AMBULATORY_CARE_PROVIDER_SITE_OTHER): Payer: 59 | Admitting: Physician Assistant

## 2019-11-24 ENCOUNTER — Other Ambulatory Visit: Payer: Self-pay

## 2019-11-24 ENCOUNTER — Encounter: Payer: Self-pay | Admitting: Physician Assistant

## 2019-11-24 VITALS — Temp 97.2°F

## 2019-11-24 DIAGNOSIS — Z720 Tobacco use: Secondary | ICD-10-CM | POA: Diagnosis not present

## 2019-11-24 DIAGNOSIS — E785 Hyperlipidemia, unspecified: Secondary | ICD-10-CM | POA: Diagnosis not present

## 2019-11-24 DIAGNOSIS — I10 Essential (primary) hypertension: Secondary | ICD-10-CM

## 2019-11-24 DIAGNOSIS — E6609 Other obesity due to excess calories: Secondary | ICD-10-CM | POA: Diagnosis not present

## 2019-11-24 NOTE — Progress Notes (Signed)
I have discussed the procedure for the virtual visit with the patient who has given consent to proceed with assessment and treatment.   Dragan Tamburrino N Matyas Baisley, LPN    . 

## 2019-11-24 NOTE — Progress Notes (Signed)
Virtual Visit via Video   I connected with patient on 11/24/19 at  8:00 AM EDT by a video enabled telemedicine application and verified that I am speaking with the correct person using two identifiers.  Location patient: Home Location provider: [place] [facility], [facility] Persons participating in the virtual visit: Patient, Provider, CMA (Patina Moore)  I discussed the limitations of evaluation and management by telemedicine and the availability of in person appointments. The patient expressed understanding and agreed to proceed.  Subjective:   HPI:   Patient presents via [facility] today for follow-up of hypertension and hyperlipidemia. Patient is currently on a regimen of Atenolol 50 mg QD, Telmisartan 40 mg QD, Zetia 10 mg QD and 81 mg ASA QD. Endorses taking medications as directed. Patient denies chest pain, palpitations, lightheadedness, dizziness, vision changes or frequent headaches. In regards to diet and exercise patient notes that he is limited with exercise presently but eats only home-cooked meals, focusing on lean protein and veggies. Is in the process of quitting smoking. Has almost completely stopped on his own. States despite this he has thankfully maintained weight and not gained as he knows this is common with smoking cessation.  ROS:   See pertinent positives and negatives per HPI.  Patient Active Problem List   Diagnosis Date Noted  . Visit for preventive health examination 10/31/2015  . Acute drug-induced gout of right foot 08/15/2015  . Hyperlipidemia 05/10/2013  . Routine general medical examination at a health care facility 05/07/2013  . Carpal tunnel syndrome 01/12/2013  . HTN (hypertension) 01/26/2012  . OSA (obstructive sleep apnea) 01/26/2012  . Adult BMI 30+ 01/26/2012    Social History   Tobacco Use  . Smoking status: Current Some Day Smoker    Packs/day: 0.25    Years: 25.00    Pack years: 6.25    Types: Cigarettes    Last attempt to quit:  06/17/2012    Years since quitting: 7.4  . Smokeless tobacco: Never Used  Substance Use Topics  . Alcohol use: No    Alcohol/week: 0.0 standard drinks    Current Outpatient Medications:  .  aspirin 81 MG tablet, Take 81 mg by mouth daily., Disp: , Rfl:  .  atenolol (TENORMIN) 50 MG tablet, TAKE ONE TABLET BY MOUTH DAILY, Disp: 30 tablet, Rfl: 0 .  ezetimibe (ZETIA) 10 MG tablet, TAKE ONE TABLET BY MOUTH DAILY, Disp: 30 tablet, Rfl: 0 .  telmisartan (MICARDIS) 40 MG tablet, TAKE ONE TABLET BY MOUTH DAILY, Disp: 30 tablet, Rfl: 0 .  famotidine (PEPCID) 40 MG tablet, Take 40 mg by mouth daily., Disp: , Rfl:   No Known Allergies  Objective:   Temp (!) 97.2 F (36.2 C) (Temporal)   Patient is well-developed, well-nourished in no acute distress.  Resting comfortably at home.  Head is normocephalic, atraumatic.  No labored breathing.  Speech is clear and coherent with logical content.  Patient is alert and oriented at baseline.   Assessment and Plan:   1. Essential hypertension Taking medications as directed.  Asymptomatic.  Unable to check BP today since he is at work.  We will continue current regimen for now.  Medications refilled.  Lab appointment scheduled this Friday to update fasting lipids and CMP.  Will have nurse check BP at that time.  2. Hyperlipidemia, unspecified hyperlipidemia type Dietary and exercise recommendations reviewed.  Continue Zetia.  Lab appointment scheduled for fasting lipid panel.  3. Obesity due to excess calories with serious comorbidity, unspecified classification Is  starting to work on this.  Has made changes in his diet.  Exercise recommendations reviewed with patient.  Will monitor closely.  4. Tobacco use Has almost completely stopped smoking on his own.  This is to be commended.  We will continue to monitor.  He is to let us know when he has achieved complete cessation.  Also encouraged him to reach out to [place] if he notes any trouble with complete  cessation so we can discuss other options for him.    Leeanne Rio, PA-C 11/24/2019

## 2019-11-26 ENCOUNTER — Other Ambulatory Visit: Payer: Self-pay

## 2019-11-26 ENCOUNTER — Ambulatory Visit (INDEPENDENT_AMBULATORY_CARE_PROVIDER_SITE_OTHER): Payer: 59 | Admitting: Emergency Medicine

## 2019-11-26 ENCOUNTER — Telehealth: Payer: Self-pay | Admitting: Physician Assistant

## 2019-11-26 DIAGNOSIS — E6609 Other obesity due to excess calories: Secondary | ICD-10-CM | POA: Diagnosis not present

## 2019-11-26 DIAGNOSIS — I10 Essential (primary) hypertension: Secondary | ICD-10-CM

## 2019-11-26 DIAGNOSIS — E785 Hyperlipidemia, unspecified: Secondary | ICD-10-CM

## 2019-11-26 LAB — COMPREHENSIVE METABOLIC PANEL
ALT: 23 U/L (ref 0–53)
AST: 15 U/L (ref 0–37)
Albumin: 4.2 g/dL (ref 3.5–5.2)
Alkaline Phosphatase: 70 U/L (ref 39–117)
BUN: 16 mg/dL (ref 6–23)
CO2: 24 mEq/L (ref 19–32)
Calcium: 9.6 mg/dL (ref 8.4–10.5)
Chloride: 104 mEq/L (ref 96–112)
Creatinine, Ser: 0.79 mg/dL (ref 0.40–1.50)
GFR: 104.2 mL/min (ref >60.00)
Glucose, Bld: 111 mg/dL — ABNORMAL HIGH (ref 70–99)
Potassium: 4.3 mEq/L (ref 3.5–5.1)
Sodium: 135 mEq/L (ref 135–145)
Total Bilirubin: 0.4 mg/dL (ref 0.2–1.2)
Total Protein: 6.6 g/dL (ref 6.0–8.3)

## 2019-11-26 LAB — LIPID PANEL
Cholesterol: 204 mg/dL — ABNORMAL HIGH (ref 0–200)
HDL: 28.7 mg/dL — ABNORMAL LOW (ref >39.00)
LDL Cholesterol: 157 mg/dL — ABNORMAL HIGH (ref 0–99)
NonHDL: 175.22
Total CHOL/HDL Ratio: 7
Triglycerides: 93 mg/dL (ref 0.0–149.0)
VLDL: 18.6 mg/dL (ref 0.0–40.0)

## 2019-11-26 MED ORDER — ATENOLOL 50 MG PO TABS: 50.0000 mg | ORAL_TABLET | Freq: Every day | ORAL | 1 refills | Status: DC

## 2019-11-26 MED ORDER — TELMISARTAN 40 MG PO TABS: 40.0000 mg | ORAL_TABLET | Freq: Every day | ORAL | 1 refills | Status: DC

## 2019-11-26 MED ORDER — EZETIMIBE 10 MG PO TABS: 10.0000 mg | ORAL_TABLET | Freq: Every day | ORAL | 1 refills | Status: DC

## 2019-11-26 NOTE — Progress Notes (Signed)
Patient presents for nurse visit for blood pressure check and labs drawn from video visit.  Today blood pressure was 130/80 in left arm and pulse was 66.

## 2019-11-26 NOTE — Telephone Encounter (Signed)
MEDICATION: Atenolol 50 MG, ezetimibe 10 MG, telmisartan 40 MG  PHARMACY: [facility] [address]  Comments:   **Let patient know to contact pharmacy at the end of the day to make sure medication is ready. **  ** Please notify patient to allow 48-72 hours to process**  **Encourage patient to contact the pharmacy for refills or they can request refills through [facility]**

## 2019-11-26 NOTE — Telephone Encounter (Signed)
Sent prescriptions to patients pharmacy.  

## 2019-11-29 ENCOUNTER — Other Ambulatory Visit: Payer: Self-pay

## 2019-11-29 DIAGNOSIS — E785 Hyperlipidemia, unspecified: Secondary | ICD-10-CM

## 2020-01-20 ENCOUNTER — Ambulatory Visit: Payer: 59 | Admitting: Internal Medicine

## 2020-04-28 ENCOUNTER — Ambulatory Visit: Payer: 59 | Admitting: Internal Medicine

## 2020-04-28 ENCOUNTER — Encounter: Payer: Self-pay | Admitting: Internal Medicine

## 2020-04-28 ENCOUNTER — Other Ambulatory Visit: Payer: Self-pay

## 2020-04-28 VITALS — BP 120/80 | HR 76 | Ht 69.0 in | Wt 311.0 lb

## 2020-04-28 DIAGNOSIS — I10 Essential (primary) hypertension: Secondary | ICD-10-CM

## 2020-04-28 DIAGNOSIS — E785 Hyperlipidemia, unspecified: Secondary | ICD-10-CM

## 2020-04-28 DIAGNOSIS — G4733 Obstructive sleep apnea (adult) (pediatric): Secondary | ICD-10-CM | POA: Diagnosis not present

## 2020-04-28 DIAGNOSIS — T466X5A Adverse effect of antihyperlipidemic and antiarteriosclerotic drugs, initial encounter: Secondary | ICD-10-CM

## 2020-04-28 DIAGNOSIS — M791 Myalgia, unspecified site: Secondary | ICD-10-CM

## 2020-04-28 NOTE — Patient Instructions (Signed)
Medication Instructions:  Your physician recommends that you continue on your current medications as directed. Please refer to the Current Medication list given to you today.  *If you need a refill on your cardiac medications before your next appointment, please call your pharmacy*   Lab Work: FASTING lipid panel before next visit with Dr. [name] in 3-4 months  If you have labs (blood work) drawn today and your tests are completely normal, you will receive your results only by: Marland Kitchen MyChart Message (if you have MyChart) OR . A paper copy in the mail If you have any lab test that is abnormal or we need to change your treatment, we will call you to review the results.   Testing/Procedures: Dr. [name] has ordered a CT coronary calcium score. This test is done at [address]. This is $150 out of pocket.   Coronary CalciumScan A coronary calcium scan is an imaging test used to look for deposits of calcium and other fatty materials (plaques) in the inner lining of the blood vessels of the heart (coronary arteries). These deposits of calcium and plaques can partly clog and narrow the coronary arteries without producing any symptoms or warning signs. This puts a person at risk for a heart attack. This test can detect these deposits before symptoms develop. Tell a health care provider about:  Any allergies you have.  All medicines you are taking, including vitamins, herbs, eye drops, creams, and over-the-counter medicines.  Any problems you or family members have had with anesthetic medicines.  Any blood disorders you have.  Any surgeries you have had.  Any medical conditions you have.  Whether you are pregnant or may be pregnant. What are the risks? Generally, this is a safe procedure. However, problems may occur, including:  Harm to a pregnant woman and her unborn baby. This test involves the use of radiation. Radiation exposure can be dangerous to a pregnant woman and  her unborn baby. If you are pregnant, you generally should not have this procedure done.  Slight increase in the risk of cancer. This is because of the radiation involved in the test. What happens before the procedure? No preparation is needed for this procedure. What happens during the procedure?  You will undress and remove any jewelry around your neck or chest.  You will put on a hospital gown.  Sticky electrodes will be placed on your chest. The electrodes will be connected to an electrocardiogram (ECG) machine to record a tracing of the electrical activity of your heart.  A CT scanner will take pictures of your heart. During this time, you will be asked to lie still and hold your breath for 2-3 seconds while a picture of your heart is being taken. The procedure may vary among health care providers and hospitals. What happens after the procedure?  You can get dressed.  You can return to your normal activities.  It is up to you to get the results of your test. Ask your health care provider, or the department that is doing the test, when your results will be ready. Summary  A coronary calcium scan is an imaging test used to look for deposits of calcium and other fatty materials (plaques) in the inner lining of the blood vessels of the heart (coronary arteries).  Generally, this is a safe procedure. Tell your health care provider if you are pregnant or may be pregnant.  No preparation is needed for this procedure.  A CT scanner will  take pictures of your heart.  You can return to your normal activities after the scan is done. This information is not intended to replace advice given to you by your health care provider. Make sure you discuss any questions you have with your health care provider. Document Released: 12/28/2007 Document Revised: 05/20/2016 Document Reviewed: 05/20/2016 Elsevier Interactive Patient Education  2017 [employer].     Follow-Up: At [facility],  you and your health needs are our priority.  As part of our continuing mission to provide you with exceptional heart care, we have created designated Provider Care Teams.  These Care Teams include your primary Cardiologist (physician) and Advanced Practice Providers (APPs -  Physician Assistants and Nurse Practitioners) who all work together to provide you with the care you need, when you need it.  We recommend signing up for the patient portal called "MyChart".  Sign up information is provided on this After Visit Summary.  MyChart is used to connect with patients for Virtual Visits (Telemedicine).  Patients are able to view lab/test results, encounter notes, upcoming appointments, etc.  Non-urgent messages can be sent to your provider as well.   To learn more about what you can do with MyChart, go to [email or web address].    Your next appointment:   3-4 month(s) - lipid clinic  The format for your next appointment:   In Person  Provider:   K. Italy Hilty, MD   Other Instructions

## 2020-04-28 NOTE — Progress Notes (Signed)
LIPID CLINIC CONSULT NOTE  Chief Complaint:  Dyslipidemia  Primary Care Physician: Waldon Merl, PA-C  Primary Cardiologist:  Chrystie Nose, MD  HPI:  Micheal Mckinney is a 49 y.o. male who is being seen today for the evaluation of dyslipidemia at the request of Noel Journey. This is a pleasant 49 year old male kindly referred for evaluation and management of dyslipidemia and cardiovascular risk.  Micheal Mckinney has a past medical history significant for hypertension and dyslipidemia with an unknown family history.  He is also struggled with weight and has a BMI of 45.  He reports a varied diet and says he tries to watch his intake of saturated fats but says that he probably likes food "too much".  Blood pressure appears well controlled.  In the past he had failed atorvastatin, simvastatin and pravastatin, all causing leg aches and then was switched to ezetimibe which he seems to be tolerating.  His most recent lipid profile showed total cholesterol 204, HDL 28 LDL 157 and triglycerides 93.  Hemoglobin A1c 5.8.  Liver enzymes are normal.  He reports being asymptomatic, denying chest pain or shortness of breath with exertion.  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  PMHx:  Past Medical History:  Diagnosis Date   Allergy    hay fever   Foreign body (FB) in soft tissue    right palm   GERD (gastroesophageal reflux disease)    History of chicken pox    Hyperlipidemia    Hypertension    OSA (obstructive sleep apnea) 01/26/2012   does not use CPAP    Past Surgical History:  Procedure Laterality Date   CARPAL TUNNEL RELEASE Bilateral 05/04/2013   CERVICAL FUSION     Alternative to this done in 05/2017, disc replacement   FOREIGN BODY REMOVAL Right 05/18/2018   Procedure: REMOVAL FOREIGN BODY RIGHT PALM;  Surgeon: Betha Loa, MD;  Location: [facility];  Service: Orthopedics;  Laterality: Right;   IRRIGATION AND DEBRIDEMENT SEBACEOUS CYST  2009    on back and neck    FAMHx:  Family History  Problem Relation Age of Onset   Cancer Mother        breast   Stroke Father     SOCHx:   reports that he has been smoking cigarettes. He has a 6.25 pack-year smoking history. He has never used smokeless tobacco. He reports that he does not drink alcohol and does not use drugs.  ALLERGIES:  No Known Allergies  ROS: Pertinent items noted in HPI and remainder of comprehensive ROS otherwise negative.  HOME MEDS: Current Outpatient Medications on File Prior to Visit  Medication Sig Dispense Refill   aspirin 81 MG tablet Take 81 mg by mouth daily.     atenolol (TENORMIN) 50 MG tablet Take 1 tablet (50 mg total) by mouth daily. 90 tablet 1   ezetimibe (ZETIA) 10 MG tablet Take 1 tablet (10 mg total) by mouth daily. 90 tablet 1   famotidine (PEPCID) 40 MG tablet Take 40 mg by mouth daily.     telmisartan (MICARDIS) 40 MG tablet Take 1 tablet (40 mg total) by mouth daily. 90 tablet 1   No current facility-administered medications on file prior to visit.    LABS/IMAGING: No results found for this or any previous visit (from the past 48 hour(s)). No  results found.  LIPID PANEL:    Component Value Date/Time   CHOL 204 (H) 11/26/2019 0913   TRIG 93.0 11/26/2019 0913   HDL 28.70 (L) 11/26/2019 0913   CHOLHDL 7 11/26/2019 0913   VLDL 18.6 11/26/2019 0913   LDLCALC 157 (H) 11/26/2019 0913   LDLDIRECT 169.6 04/13/2014 0937    WEIGHTS: Wt Readings from Last 3 Encounters:  04/28/20 (!) 311 lb (141.1 kg)  01/12/19 (!) 308 lb 9.6 oz (140 kg)  09/24/18 (!) 309 lb (140.2 kg)    VITALS: BP 120/80    Pulse 76    Ht 5\' 9"  (1.753 m)    Wt (!) 311 lb (141.1 kg)    SpO2 98%    BMI 45.93 kg/m   EXAM: General appearance: alert and no distress Neck: no carotid bruit, no JVD and thyroid not enlarged, symmetric, no  tenderness/mass/nodules Lungs: clear to auscultation bilaterally Heart: regular rate and rhythm, S1, S2 normal, no murmur, click, rub or gallop Abdomen: soft, non-tender; bowel sounds normal; no masses,  no organomegaly Extremities: extremities normal, atraumatic, no cyanosis or edema Pulses: 2+ and symmetric Skin: Skin color, texture, turgor normal. No rashes or lesions Neurologic: Grossly normal Psych: Pleasant  EKG: Deferred  ASSESSMENT: 1. Mixed dyslipidemia  2. Statin myalgias 3. Morbid obesity 4. Hypertension-controlled 5. OSA  PLAN: 1.   Micheal Mckinney has a mixed dyslipidemia and likely should target LDL less than 100.  We can further restratify him with calcium scoring which I think would be beneficial.  We do not know much about his family history.  He continues to need to work on weight loss and dietary changes and we provided some dietary information form today.  His blood pressure is well controlled.  Based on his calcium score findings, will recommend therapy to try to target LDL lower, possibly with another statin or PCSK9i if there is identifiable coronary disease.  Plan follow-up in 3-4 months with repeat lipids. Thanks for the kind referral.  Annice Pih, MD, [facility]  [place]   [facility]  Medical Director of the Advanced Lipid Disorders &  Cardiovascular Risk Reduction Clinic Diplomate of the American Board of Clinical Lipidology Attending Cardiologist  Direct Dial: [phone]   Fax: [phone]  Website:  [email or web address] 04/28/2020, 12:39 PM

## 2020-05-05 ENCOUNTER — Ambulatory Visit (INDEPENDENT_AMBULATORY_CARE_PROVIDER_SITE_OTHER)
Admission: RE | Admit: 2020-05-05 | Discharge: 2020-05-05 | Disposition: A | Discharge status: Home / Self Care | Payer: 59 | Source: Ambulatory Visit | Attending: Internal Medicine | Admitting: Internal Medicine

## 2020-05-05 ENCOUNTER — Other Ambulatory Visit: Payer: Self-pay

## 2020-05-05 DIAGNOSIS — E785 Hyperlipidemia, unspecified: Secondary | ICD-10-CM

## 2020-05-08 ENCOUNTER — Other Ambulatory Visit: Payer: Self-pay | Admitting: *Deleted

## 2020-05-08 MED ORDER — ROSUVASTATIN CALCIUM 5 MG PO TABS
5.0000 mg | ORAL_TABLET | Freq: Every day | ORAL | 3 refills | Status: DC
Start: 2020-05-08 — End: 2021-02-14

## 2020-05-16 ENCOUNTER — Telehealth: Payer: Self-pay | Admitting: Internal Medicine

## 2020-05-16 NOTE — Telephone Encounter (Signed)
Dr. Blanchie Dessert lipid patient was in ED for palpitations ([facility]) Left message to discuss if patient would like ED follow up scheduled with Dr. [name] instead of waiting until scheduled lipid clinic visit   Hilty, Lisette Abu, MD  Lindell Spar, RN Seen in the ER at [facility] for palpitations - this is a lipid patient, but I can see back sooner for this if he wants.   Dr. Rexene Edison       Previous Messages   ----- Message -----  From: Burton Apley [facility])  Sent: 05/15/2020 10:28 PM EDT  To: Chrystie Nose, MD [facility])  Subject: Emergency Discharge on 05/15/2020

## 2020-05-18 ENCOUNTER — Other Ambulatory Visit: Payer: Self-pay | Admitting: Internal Medicine

## 2020-05-18 NOTE — Telephone Encounter (Signed)
*  STAT* If patient is at the pharmacy, call can be transferred to refill team.   1. Which medications need to be refilled? (please list name of each medication and dose if known) Rosuvastatin  2.Which pharmacy/location (including street and city if local pharmacy) is medication to be sent to? [facility] [place]  3. Do they need a 30 day or 90 day supply? 90 days and refills

## 2020-05-19 ENCOUNTER — Telehealth (INDEPENDENT_AMBULATORY_CARE_PROVIDER_SITE_OTHER): Payer: 59 | Admitting: Physician Assistant

## 2020-05-19 ENCOUNTER — Encounter: Payer: Self-pay | Admitting: Physician Assistant

## 2020-05-19 DIAGNOSIS — I10 Essential (primary) hypertension: Secondary | ICD-10-CM

## 2020-05-19 DIAGNOSIS — R002 Palpitations: Secondary | ICD-10-CM | POA: Diagnosis not present

## 2020-05-19 NOTE — Progress Notes (Signed)
Virtual Visit via Video   I connected with patient on 05/19/20 at  9:00 AM EDT by a video enabled telemedicine application and verified that I am speaking with the correct person using two identifiers.  Location patient: Home Location provider: [place], Office Persons participating in the virtual visit: Patient, Provider, CMA (Patina Moore)  I discussed the limitations of evaluation and management by telemedicine and the availability of in person appointments. The patient expressed understanding and agreed to proceed.  Subjective:   HPI:   Patient presents via [facility] today for ER follow-up. Patient presented to the ER on 05/15/2020 with intermittent palpitations and having some lower BP that evening. ER workup at that time was unremarkable except for some occasional PVCs noted on heart monitor while he was there (was not captured on EKG). Was discharged to schedule follow-up with PCP and Cardiology.    Since discharge, patient notes doing well overall. Denies any recurrence of symptoms. Denies low BP at home. Has been keeping well-hydrated and keeping very active. Taking medications as directed. Can occasional feel a skipped beat but no runs of palpitations or tachycardia. Denies chest pain, lightheadedness or dizziness. Denies SOB. Has not called his cardiologist.    ROS:   See pertinent positives and negatives per HPI.  Patient Active Problem List   Diagnosis Date Noted  . Visit for preventive health examination 10/31/2015  . Acute drug-induced gout of right foot 08/15/2015  . Hyperlipidemia 05/10/2013  . Routine general medical examination at a health care facility 05/07/2013  . Carpal tunnel syndrome 01/12/2013  . HTN (hypertension) 01/26/2012  . OSA (obstructive sleep apnea) 01/26/2012  . Obesity 01/26/2012    Social History   Tobacco Use  . Smoking status: Former Smoker    Packs/day: 0.25    Years: 25.00    Pack years: 6.25    Types: Cigarettes     Quit date: 01/16/2020    Years since quitting: 0.3  . Smokeless tobacco: Never Used  Substance Use Topics  . Alcohol use: No    Alcohol/week: 0.0 standard drinks    Current Outpatient Medications:  .  aspirin 81 MG tablet, Take 81 mg by mouth daily., Disp: , Rfl:  .  atenolol (TENORMIN) 50 MG tablet, Take 1 tablet (50 mg total) by mouth daily., Disp: 90 tablet, Rfl: 1 .  ezetimibe (ZETIA) 10 MG tablet, Take 1 tablet (10 mg total) by mouth daily., Disp: 90 tablet, Rfl: 1 .  famotidine (PEPCID) 40 MG tablet, Take 40 mg by mouth daily., Disp: , Rfl:  .  ranitidine (ZANTAC) 150 MG tablet, Take 1 tablet by mouth as needed., Disp: , Rfl:  .  rosuvastatin (CRESTOR) 5 MG tablet, Take 1 tablet (5 mg total) by mouth daily., Disp: 90 tablet, Rfl: 3 .  telmisartan (MICARDIS) 40 MG tablet, Take 1 tablet (40 mg total) by mouth daily., Disp: 90 tablet, Rfl: 1  No Known Allergies  Objective:   There were no vitals taken for this visit.  Patient is well-developed, well-nourished in no acute distress.  Resting comfortably at home.  Head is normocephalic, atraumatic.  No labored breathing.  Speech is clear and coherent with logical content.  Patient is alert and oriented at baseline.   Assessment and Plan:   1. Primary hypertension 2. Palpitations BP stable since discharge. Diagnosed with occasional PVC during ER assessment. Has noted some since being home but no run of palpitations. Recommend he continue to avoid caffeine and alcohol Keep hydrated. Continue  current regimen. He is to call his Cardiologist for follow-up. Can take an additional 1/2 tablet of atenolol if occasional PVC is bothersome.    Piedad Climes, PA-C 05/19/2020

## 2020-05-19 NOTE — Progress Notes (Signed)
I have discussed the procedure for the virtual visit with the patient who has given consent to proceed with assessment and treatment.   Phillis Thackeray S Larsen Zettel, CMA     

## 2020-05-22 ENCOUNTER — Other Ambulatory Visit: Payer: Self-pay | Admitting: Physician Assistant

## 2020-05-22 DIAGNOSIS — I10 Essential (primary) hypertension: Secondary | ICD-10-CM

## 2020-05-23 NOTE — Telephone Encounter (Signed)
Patient last seen for ER f/u via video visit on 05/19/20 and last seen for hypertension on 11/24/19. Patient has no future appt scheduled, but is scheduled with Dr. [name] for f/u on lipids on 08/10/20.

## 2020-07-19 ENCOUNTER — Telehealth: Payer: Self-pay | Admitting: Physician Assistant

## 2020-07-19 NOTE — Telephone Encounter (Signed)
Pt's wife called in asking if pt can come to our office and have his labs done here for Dr. [name].   Please advise

## 2020-07-19 NOTE — Telephone Encounter (Signed)
Dr. [name] is a [facility] provider so labs are available in the computer system and can be released under his name.  Patient can have them drawn at our office as long as he passes Covid screening.  Results should go directly to Dr. [name].

## 2020-07-19 NOTE — Telephone Encounter (Signed)
Please advise 

## 2020-07-20 NOTE — Telephone Encounter (Signed)
Pt is aware and will call back to schedule a lab only appt.

## 2020-07-31 ENCOUNTER — Other Ambulatory Visit: Payer: 59

## 2020-08-02 ENCOUNTER — Other Ambulatory Visit: Payer: 59

## 2020-08-03 ENCOUNTER — Other Ambulatory Visit (INDEPENDENT_AMBULATORY_CARE_PROVIDER_SITE_OTHER): Payer: 59

## 2020-08-03 ENCOUNTER — Other Ambulatory Visit: Payer: Self-pay

## 2020-08-03 ENCOUNTER — Other Ambulatory Visit: Payer: Self-pay | Admitting: Emergency Medicine

## 2020-08-03 DIAGNOSIS — I10 Essential (primary) hypertension: Secondary | ICD-10-CM | POA: Diagnosis not present

## 2020-08-03 DIAGNOSIS — E785 Hyperlipidemia, unspecified: Secondary | ICD-10-CM | POA: Diagnosis not present

## 2020-08-03 LAB — COMPREHENSIVE METABOLIC PANEL
ALT: 26 U/L (ref 0–53)
AST: 21 U/L (ref 0–37)
Albumin: 4.5 g/dL (ref 3.5–5.2)
Alkaline Phosphatase: 66 U/L (ref 39–117)
BUN: 16 mg/dL (ref 6–23)
CO2: 27 mEq/L (ref 19–32)
Calcium: 10.3 mg/dL (ref 8.4–10.5)
Chloride: 106 mEq/L (ref 96–112)
Creatinine, Ser: 0.86 mg/dL (ref 0.40–1.50)
GFR: 101.47 mL/min (ref >60.00)
Glucose, Bld: 100 mg/dL — ABNORMAL HIGH (ref 70–99)
Potassium: 4.6 mEq/L (ref 3.5–5.1)
Sodium: 137 mEq/L (ref 135–145)
Total Bilirubin: 0.6 mg/dL (ref 0.2–1.2)
Total Protein: 6.8 g/dL (ref 6.0–8.3)

## 2020-08-03 LAB — LIPID PANEL
Cholesterol: 121 mg/dL (ref 0–200)
HDL: 31.8 mg/dL — ABNORMAL LOW (ref >39.00)
LDL Cholesterol: 73 mg/dL (ref 0–99)
NonHDL: 89.2
Total CHOL/HDL Ratio: 4
Triglycerides: 79 mg/dL (ref 0.0–149.0)
VLDL: 15.8 mg/dL (ref 0.0–40.0)

## 2020-08-10 ENCOUNTER — Other Ambulatory Visit: Payer: Self-pay

## 2020-08-10 ENCOUNTER — Ambulatory Visit: Payer: 59 | Admitting: Internal Medicine

## 2020-08-10 ENCOUNTER — Encounter: Payer: Self-pay | Admitting: Internal Medicine

## 2020-08-10 VITALS — BP 136/75 | HR 69 | Ht 68.0 in | Wt 318.2 lb

## 2020-08-10 DIAGNOSIS — T466X5A Adverse effect of antihyperlipidemic and antiarteriosclerotic drugs, initial encounter: Secondary | ICD-10-CM

## 2020-08-10 DIAGNOSIS — M791 Myalgia, unspecified site: Secondary | ICD-10-CM

## 2020-08-10 DIAGNOSIS — E785 Hyperlipidemia, unspecified: Secondary | ICD-10-CM

## 2020-08-10 DIAGNOSIS — G4733 Obstructive sleep apnea (adult) (pediatric): Secondary | ICD-10-CM | POA: Diagnosis not present

## 2020-08-10 DIAGNOSIS — I10 Essential (primary) hypertension: Secondary | ICD-10-CM | POA: Diagnosis not present

## 2020-08-10 DIAGNOSIS — R931 Abnormal findings on diagnostic imaging of heart and coronary circulation: Secondary | ICD-10-CM

## 2020-08-10 NOTE — Patient Instructions (Signed)

## 2020-08-10 NOTE — Progress Notes (Signed)
LIPID CLINIC CONSULT NOTE  Chief Complaint:  Follow-up dyslipidemia  Primary Care Physician: Waldon Merl, PA-C  Primary Cardiologist:  Chrystie Nose, MD  HPI:  Micheal Mckinney is a 50 y.o. male who is being seen today for the evaluation of dyslipidemia at the request of Noel Journey. This is a pleasant 50 year old male kindly referred for evaluation and management of dyslipidemia and cardiovascular risk.  Micheal Mckinney has a past medical history significant for hypertension and dyslipidemia with an unknown family history.  He is also struggled with weight and has a BMI of 45.  He reports a varied diet and says he tries to watch his intake of saturated fats but says that he probably likes food "too much".  Blood pressure appears well controlled.  In the past he had failed atorvastatin, simvastatin and pravastatin, all causing leg aches and then was switched to ezetimibe which he seems to be tolerating.  His most recent lipid profile showed total cholesterol 204, HDL 28 LDL 157 and triglycerides 93.  Hemoglobin A1c 5.8.  Liver enzymes are normal.  He reports being asymptomatic, denying chest pain or shortness of breath with exertion.  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  08/10/2018  Micheal Mckinney returns today for follow-up.  He has had a great response to rosuvastatin.  Total cholesterol is 121, down from 204, triglycerides 79, HDL 32 and LDL 73 (down from 157).  He did have calcium scoring last time I saw him which was slightly abnormal with a calcium score of 11 however given his younger age his percentile ranking was 72nd.  Based on this I think is very reasonable to aggressively treat him to target an LDL of 70 or lower which is very close to even though he is not yet been on the statin for at least 3 months.  He also is taking Zetia.  So far seems to be tolerating this  well without any side effects.  PMHx:  Past Medical History:  Diagnosis Date  . Allergy    hay fever  . Foreign body (FB) in soft tissue    right palm  . GERD (gastroesophageal reflux disease)   . History of chicken pox   . Hyperlipidemia   . Hypertension   . OSA (obstructive sleep apnea) 01/26/2012   does not use CPAP    Past Surgical History:  Procedure Laterality Date  . CARPAL TUNNEL RELEASE Bilateral 05/04/2013  . CERVICAL FUSION     Alternative to this done in 05/2017, disc replacement  . FOREIGN BODY REMOVAL Right 05/18/2018   Procedure: REMOVAL FOREIGN BODY RIGHT PALM;  Surgeon: Betha Loa, MD;  Location: [facility];  Service: Orthopedics;  Laterality: Right;  . IRRIGATION AND DEBRIDEMENT SEBACEOUS CYST  2009    on back and neck    FAMHx:  Family History  Problem Relation Age of Onset  . Cancer Mother        breast  . Stroke Father     SOCHx:   reports that he quit smoking about 6 months ago. His smoking use included cigarettes. He has a 6.25 pack-year smoking history. He has never used smokeless tobacco. He reports that he does not drink alcohol and does not use drugs.  ALLERGIES:  No Known Allergies  ROS: Pertinent items noted in HPI and remainder of comprehensive  ROS otherwise negative.  HOME MEDS: Current Outpatient Medications on File Prior to Visit  Medication Sig Dispense Refill  . aspirin 81 MG tablet Take 81 mg by mouth daily.    Marland Kitchen atenolol (TENORMIN) 50 MG tablet TAKE ONE TABLET BY MOUTH DAILY 90 tablet 1  . ezetimibe (ZETIA) 10 MG tablet TAKE ONE TABLET BY MOUTH DAILY 90 tablet 1  . famotidine (PEPCID) 40 MG tablet Take 40 mg by mouth daily.    . ranitidine (ZANTAC) 150 MG tablet Take 1 tablet by mouth as needed.    Marland Kitchen telmisartan (MICARDIS) 40 MG tablet TAKE ONE TABLET BY MOUTH DAILY 90 tablet 1  . rosuvastatin (CRESTOR) 5 MG tablet Take 1 tablet (5 mg total) by mouth daily. 90 tablet 3   No current facility-administered  medications on file prior to visit.    LABS/IMAGING: No results found for this or any previous visit (from the past 48 hour(s)). No results found.  LIPID PANEL:    Component Value Date/Time   CHOL 121 08/03/2020 0953   TRIG 79.0 08/03/2020 0953   HDL 31.80 (L) 08/03/2020 0953   CHOLHDL 4 08/03/2020 0953   VLDL 15.8 08/03/2020 0953   LDLCALC 73 08/03/2020 0953   LDLDIRECT 169.6 04/13/2014 0937    WEIGHTS: Wt Readings from Last 3 Encounters:  08/10/20 (!) 318 lb 3.2 oz (144.3 kg)  04/28/20 (!) 311 lb (141.1 kg)  01/12/19 (!) 308 lb 9.6 oz (140 kg)    VITALS: BP 136/75   Pulse 69   Ht 5\' 8"  (1.727 m)   Wt (!) 318 lb 3.2 oz (144.3 kg)   SpO2 98%   BMI 48.38 kg/m   EXAM: General appearance: alert and no distress Neck: no carotid bruit, no JVD and thyroid not enlarged, symmetric, no tenderness/mass/nodules Lungs: clear to auscultation bilaterally Heart: regular rate and rhythm, S1, S2 normal, no murmur, click, rub or gallop Abdomen: soft, non-tender; bowel sounds normal; no masses,  no organomegaly Extremities: extremities normal, atraumatic, no cyanosis or edema Pulses: 2+ and symmetric Skin: Skin color, texture, turgor normal. No rashes or lesions Neurologic: Grossly normal Psych: Pleasant  EKG: Deferred  ASSESSMENT: 1. Mixed dyslipidemia  2. Statin myalgias 3. Morbid obesity 4. Hypertension-controlled 5. OSA 6. CAC score of 11, 72nd percentile for age-matched controls (04/2020)  PLAN: 1.   Micheal Mckinney has had a very good response to rosuvastatin in addition to ezetimibe with an LDL of 73.  This is despite some weight gain.  He has been successful to stop smoking for about 8 months.  He is continue to work on weight loss.  He is very physically active as a Annice Pih.  I am pleased with the reduction in his cholesterol.  I recommend he stay on this regimen.  I think he could be prescribed through his primary care provider in the future.  I am certainly happy to  see him if he is not able to maintain a low cholesterol or additional therapies are necessary in the future as needed.  Thanks for allowing me to participate in his care.  Nutritional therapist, MD, [facility], FACP  [place]  [facility] HeartCare  Medical Director of the Advanced Lipid Disorders &  Cardiovascular Risk Reduction Clinic Diplomate of the American Board of Clinical Lipidology Attending Cardiologist  Direct Dial: [phone]  Fax: [phone]  Website:  [email or web address]  [phone] Grigor Lipschutz 08/10/2020, 2:13 PM

## 2020-11-16 ENCOUNTER — Other Ambulatory Visit: Payer: Self-pay

## 2020-11-16 DIAGNOSIS — I10 Essential (primary) hypertension: Secondary | ICD-10-CM

## 2020-11-16 DIAGNOSIS — E785 Hyperlipidemia, unspecified: Secondary | ICD-10-CM

## 2020-11-16 MED ORDER — ATENOLOL 50 MG PO TABS
50.0000 mg | ORAL_TABLET | Freq: Every day | ORAL | 0 refills | Status: DC
Start: 2020-11-16 — End: 2021-02-14

## 2020-11-16 MED ORDER — TELMISARTAN 40 MG PO TABS: 40.0000 mg | ORAL_TABLET | Freq: Every day | ORAL | 0 refills | Status: DC

## 2020-11-16 MED ORDER — EZETIMIBE 10 MG PO TABS: 10.0000 mg | ORAL_TABLET | Freq: Every day | ORAL | 0 refills | Status: DC

## 2021-02-10 ENCOUNTER — Other Ambulatory Visit: Payer: Self-pay | Admitting: Family

## 2021-02-10 DIAGNOSIS — I10 Essential (primary) hypertension: Secondary | ICD-10-CM

## 2021-02-10 DIAGNOSIS — E785 Hyperlipidemia, unspecified: Secondary | ICD-10-CM

## 2021-02-14 ENCOUNTER — Telehealth: Payer: Self-pay

## 2021-02-14 ENCOUNTER — Other Ambulatory Visit: Payer: Self-pay

## 2021-02-14 DIAGNOSIS — I10 Essential (primary) hypertension: Secondary | ICD-10-CM

## 2021-02-14 DIAGNOSIS — E785 Hyperlipidemia, unspecified: Secondary | ICD-10-CM

## 2021-02-14 MED ORDER — ATENOLOL 50 MG PO TABS: 50.0000 mg | ORAL_TABLET | Freq: Every day | ORAL | 0 refills | Status: DC

## 2021-02-14 MED ORDER — TELMISARTAN 40 MG PO TABS: 40.0000 mg | ORAL_TABLET | Freq: Every day | ORAL | 0 refills | Status: DC

## 2021-02-14 MED ORDER — EZETIMIBE 10 MG PO TABS: 10.0000 mg | ORAL_TABLET | Freq: Every day | ORAL | 0 refills | Status: DC

## 2021-02-14 MED ORDER — ROSUVASTATIN CALCIUM 5 MG PO TABS: 5.0000 mg | ORAL_TABLET | Freq: Every day | ORAL | 3 refills | Status: DC

## 2021-02-14 NOTE — Telephone Encounter (Signed)
Request sent to FNP Webb for approval. 

## 2021-02-14 NOTE — Telephone Encounter (Signed)
Pt needs refill on ezetimibe (ZETIA) 10 MG tablet , atenolol (TENORMIN) 50 MG tablet , telmisartan (MICARDIS) 40 MG tablet , rosuvastatin (CRESTOR) 5 MG tablet   Pt has TOC  [facility] [id number] - [place], [place] - [address]  [address], [place] [address]  LOV 05/19/20 NOV 11/4 for [facility] Patient already given courtesy refills

## 2021-02-14 NOTE — Telephone Encounter (Signed)
Pt needs refill on ezetimibe (ZETIA) 10 MG tablet , atenolol (TENORMIN) 50 MG tablet , telmisartan (MICARDIS) 40 MG tablet , rosuvastatin (CRESTOR) 5 MG tablet  Pt has TOC  [facility] [id number] - , [place] - [address]  [address],  [place] [address]

## 2021-05-10 ENCOUNTER — Other Ambulatory Visit: Payer: Self-pay | Admitting: Family

## 2021-05-10 DIAGNOSIS — E785 Hyperlipidemia, unspecified: Secondary | ICD-10-CM

## 2021-05-10 DIAGNOSIS — I10 Essential (primary) hypertension: Secondary | ICD-10-CM

## 2021-05-18 ENCOUNTER — Other Ambulatory Visit: Payer: Self-pay

## 2021-05-18 ENCOUNTER — Ambulatory Visit: Payer: 59 | Admitting: Family Medicine

## 2021-05-18 VITALS — BP 130/78 | HR 71 | Temp 97.8°F | Resp 16 | Ht 68.0 in | Wt 307.6 lb

## 2021-05-18 DIAGNOSIS — Z1211 Encounter for screening for malignant neoplasm of colon: Secondary | ICD-10-CM

## 2021-05-18 DIAGNOSIS — G4733 Obstructive sleep apnea (adult) (pediatric): Secondary | ICD-10-CM | POA: Diagnosis not present

## 2021-05-18 DIAGNOSIS — R7303 Prediabetes: Secondary | ICD-10-CM

## 2021-05-18 DIAGNOSIS — M25511 Pain in right shoulder: Secondary | ICD-10-CM

## 2021-05-18 DIAGNOSIS — I10 Essential (primary) hypertension: Secondary | ICD-10-CM | POA: Diagnosis not present

## 2021-05-18 DIAGNOSIS — E785 Hyperlipidemia, unspecified: Secondary | ICD-10-CM | POA: Diagnosis not present

## 2021-05-18 DIAGNOSIS — R12 Heartburn: Secondary | ICD-10-CM

## 2021-05-18 DIAGNOSIS — Z6841 Body Mass Index (BMI) 40.0 and over, adult: Secondary | ICD-10-CM

## 2021-05-18 LAB — LIPID PANEL
Cholesterol: 116 mg/dL (ref 0–200)
HDL: 35.1 mg/dL — ABNORMAL LOW (ref >39.00)
LDL Cholesterol: 69 mg/dL (ref 0–99)
NonHDL: 81.35
Total CHOL/HDL Ratio: 3
Triglycerides: 64 mg/dL (ref 0.0–149.0)
VLDL: 12.8 mg/dL (ref 0.0–40.0)

## 2021-05-18 LAB — COMPREHENSIVE METABOLIC PANEL
ALT: 29 U/L (ref 0–53)
AST: 21 U/L (ref 0–37)
Albumin: 4.7 g/dL (ref 3.5–5.2)
Alkaline Phosphatase: 64 U/L (ref 39–117)
BUN: 16 mg/dL (ref 6–23)
CO2: 28 mEq/L (ref 19–32)
Calcium: 11 mg/dL — ABNORMAL HIGH (ref 8.4–10.5)
Chloride: 104 mEq/L (ref 96–112)
Creatinine, Ser: 0.94 mg/dL (ref 0.40–1.50)
GFR: 94.47 mL/min (ref >60.00)
Glucose, Bld: 116 mg/dL — ABNORMAL HIGH (ref 70–99)
Potassium: 4.7 mEq/L (ref 3.5–5.1)
Sodium: 139 mEq/L (ref 135–145)
Total Bilirubin: 0.5 mg/dL (ref 0.2–1.2)
Total Protein: 7.7 g/dL (ref 6.0–8.3)

## 2021-05-18 LAB — HEMOGLOBIN A1C: Hgb A1c MFr Bld: 5.7 % (ref 4.6–6.5)

## 2021-05-18 MED ORDER — ATENOLOL 50 MG PO TABS: 50.0000 mg | ORAL_TABLET | Freq: Every day | ORAL | 1 refills | Status: DC

## 2021-05-18 MED ORDER — TELMISARTAN 40 MG PO TABS: 40.0000 mg | ORAL_TABLET | Freq: Every day | ORAL | 1 refills | Status: DC

## 2021-05-18 MED ORDER — ROSUVASTATIN CALCIUM 5 MG PO TABS: 5.0000 mg | ORAL_TABLET | Freq: Every day | ORAL | 1 refills | Status: DC

## 2021-05-18 NOTE — Progress Notes (Signed)
Subjective:  Patient ID: Tresea Mall, male    DOB: 11-06-1970  Age: 50 y.o. MRN: [id number]  CC:  Chief Complaint  Patient presents with   Establish Care    Pt reports, here for transfer from [facility], no concerns notes he is due for refills on all of his medications    Gastroesophageal Reflux    Pt in need of refill zantac, note works well no concerns    Health Maintenance    Pt due for colonoscopy willing to have ordered to be done next few months    Hyperlipidemia    Pt in need of refill crestor due for recheck as well     HPI BRITTANY AMIRAULT presents for  New patient establish care, previous primary care provider Malva Cogan, PA-C Works as a Nutritional therapist, for [employer].  Quit smoking 1.5-2 years ago. No relapse. No cravings recently.  Quit alcohol 5-6 years ago.  Moved from [place] in 1999 to [place], then to [place] past 10 years.   Hypertension: Treated with atenolol 50 mg daily, telmisartan 40 mg daily Complicated by history of obstructive sleep apnea. Tried cpap in past - felt like he was choking on it, could not tolerate. About 4-5 years ago. (2014 - severe OSA) Has adjusted sleep - sleeps on side. No daytime somnolence. No naps.  Cardiologist, lipid clinic specialist Dr. [name], office visit January 27 noted.  Continued on ezetimibe, Crestor. Follow-up as needed. Home readings: 115/75-80.  No new side effects.  BP Readings from Last 3 Encounters:  05/18/21 130/78  08/10/20 136/75  04/28/20 120/80   Lab Results  Component Value Date   CREATININE 0.86 08/03/2020   Hyperlipidemia: As above followed by lipid clinic previously, treated with Zetia, Crestor.  Previous coronary calcium score 11, but given his lower age percentile was 72nd.  Based on the information, plan to aggressively treat with target of LDL of 70 or lower.  Lipid panel planned today.  Currently taking Crestor 5 mg daily. Prior on zetia - had side effect - no recent use. No new myalgias.  Lab  Results  Component Value Date   CHOL 121 08/03/2020   HDL 31.80 (L) 08/03/2020   LDLCALC 73 08/03/2020   LDLDIRECT 169.6 04/13/2014   TRIG 79.0 08/03/2020   CHOLHDL 4 08/03/2020   Lab Results  Component Value Date   ALT 26 08/03/2020   AST 21 08/03/2020   ALKPHOS 66 08/03/2020   BILITOT 0.6 08/03/2020   GERD Treated with Zantac 150 mg daily.  Has been working well. Prior late night eating. May try weaning off meds.   Obesity Weight has decreased since January. Weight increased, then decreased by 25# by home scale.  Exercise: Works as a Nutritional therapist, active job.  Has added exercise - shoulder issue affecting heavy bag work, more walking. Diet: has cut carbs.  Soda/sweet tea - none. Drinks water, crystal light.  Fast food: none.  Takeout/restaurant food - rare. Usually cooking at home. Portions have been the issue in past.  Late-night meals/snacking: in past as late work. Less side work, eating earlier now.  Wt Readings from Last 3 Encounters:  05/18/21 (!) 307 lb 9.6 oz (139.5 kg)  08/10/20 (!) 318 lb 3.2 oz (144.3 kg)  04/28/20 (!) 311 lb (141.1 kg)   Body mass index is 46.77 kg/m. Lab Results  Component Value Date   HGBA1C 5.8 01/12/2019    R shoulder pain: Past year. No known injury but physical work.  Initially worse, some persistent pain, difficulty with abduction. Has not had evaluated. Would prefer nonsurgical treatment.  Tx: rest. No meds.  No prior R shoulder injury, treatment.  R hand dominant.   Health maintenance Colon CA: no FH, no personal hx of polyps.  Screening options with colonoscopy versus Cologuard discussed. Discussed timing of repeat testing intervals if normal, as well as potential need for diagnostic Colonoscopy if positive Cologuard. Understanding expressed, and chose Cologuard.   Flu vaccine recommended  - declined  COVID-vaccine declined Shingles vaccine declined at this time.   Immunization History  Administered Date(s) Administered    Influenza,inj,Quad PF,6+ Mos 05/07/2013, 04/13/2014, 04/28/2015, 08/12/2016, 10/03/2017, 04/07/2018   Td 01/23/2009   Tdap 04/07/2018    History Patient Active Problem List   Diagnosis Date Noted   Visit for preventive health examination 10/31/2015   Acute drug-induced gout of right foot 08/15/2015   Hyperlipidemia 05/10/2013   Routine general medical examination at a health care facility 05/07/2013   Carpal tunnel syndrome 01/12/2013   HTN (hypertension) 01/26/2012   OSA (obstructive sleep apnea) 01/26/2012   Obesity 01/26/2012   Past Medical History:  Diagnosis Date   Allergy    hay fever   Foreign body (FB) in soft tissue    right palm   GERD (gastroesophageal reflux disease)    History of chicken pox    Hyperlipidemia    Hypertension    OSA (obstructive sleep apnea) 01/26/2012   does not use CPAP   Past Surgical History:  Procedure Laterality Date   CARPAL TUNNEL RELEASE Bilateral 05/04/2013   CERVICAL FUSION     Alternative to this done in 05/2017, disc replacement   FOREIGN BODY REMOVAL Right 05/18/2018   Procedure: REMOVAL FOREIGN BODY RIGHT PALM;  Surgeon: Betha Loa, MD;  Location: [facility];  Service: Orthopedics;  Laterality: Right;   IRRIGATION AND DEBRIDEMENT SEBACEOUS CYST  2009    on back and neck   No Known Allergies Prior to Admission medications   Medication Sig Start Date End Date Taking? Authorizing Provider  aspirin 81 MG tablet Take 81 mg by mouth daily.   Yes [provider]  atenolol (TENORMIN) 50 MG tablet Take 1 tablet (50 mg total) by mouth daily. 02/14/21  Yes Worthy Rancher B, FNP  ranitidine (ZANTAC) 150 MG tablet Take 1 tablet by mouth as needed.   Yes [provider]  telmisartan (MICARDIS) 40 MG tablet Take 1 tablet (40 mg total) by mouth daily. 02/14/21  Yes Worthy Rancher B, FNP  rosuvastatin (CRESTOR) 5 MG tablet Take 1 tablet (5 mg total) by mouth daily. 02/14/21 05/15/21  Eulis Foster, FNP   Social  History   Socioeconomic History   Marital status: Married    Spouse name: Not on file   Number of children: Not on file   Years of education: Not on file   Highest education level: Not on file  Occupational History   Not on file  Tobacco Use   Smoking status: Former    Packs/day: 0.25    Years: 25.00    Pack years: 6.25    Types: Cigarettes    Quit date: 01/16/2020    Years since quitting: 1.3   Smokeless tobacco: Never  Vaping Use   Vaping Use: Never used  Substance and Sexual Activity   Alcohol use: No    Alcohol/week: 0.0 standard drinks   Drug use: No   Sexual activity: Yes  Other Topics Concern   Not  on file  Social History Narrative   Lives with wife and dogs   Social Determinants of Health   Financial Resource Strain: Not on file  Food Insecurity: Not on file  Transportation Needs: Not on file  Physical Activity: Not on file  Stress: Not on file  Social Connections: Not on file  Intimate Partner Violence: Not on file    Review of Systems  Constitutional:  Negative for fatigue and unexpected weight change (intentional weight loss.).  Eyes:  Negative for visual disturbance.  Respiratory:  Negative for cough, chest tightness and shortness of breath.   Cardiovascular:  Negative for chest pain, palpitations and leg swelling.  Gastrointestinal:  Negative for abdominal pain and blood in stool.  Neurological:  Negative for dizziness, light-headedness and headaches.    Objective:   Vitals:   05/18/21 0917  BP: 130/78  Pulse: 71  Resp: 16  Temp: 97.8 F (36.6 C)  TempSrc: Temporal  SpO2: 97%  Weight: (!) 307 lb 9.6 oz (139.5 kg)  Height: 5\' 8"  (1.727 m)     Physical Exam Vitals reviewed.  Constitutional:      General: He is not in acute distress.    Appearance: He is well-developed. He is obese.  HENT:     Head: Normocephalic and atraumatic.  Neck:     Vascular: No carotid bruit or JVD.  Cardiovascular:     Rate and Rhythm: Normal rate and  regular rhythm.     Heart sounds: Normal heart sounds. No murmur heard. Pulmonary:     Effort: Pulmonary effort is normal.     Breath sounds: Normal breath sounds. No rales.  Musculoskeletal:     Right lower leg: No edema.     Left lower leg: No edema.     Comments: R shoulder.  Nontender, no pain in shoulder with range of motion of C-spine.  No focal bony tenderness of right shoulder, [place], AC, clavicle nontender.  Active range of motion intact except for abduction which was limited at approximately 100 degrees.  Passive range of motion also limited at 100 degrees abduction. Full rotator cuff strength, minimal discomfort with empty can but no weakness.  Negative liftoff.  Negative Neer, [place].  Skin:    General: Skin is warm and dry.  Neurological:     Mental Status: He is alert and oriented to person, place, and time.  Psychiatric:        Mood and Affect: Mood normal.    56 minutes spent during visit, including chart review, exam of additioanl concern review of labs/consultant notes, counseling and assimilation of information, exam, discussion of plan, and chart completion.     Assessment & Plan:  IDA VENKATARAMAN is a 50 y.o. male . Essential hypertension - Plan: atenolol (TENORMIN) 50 MG tablet, telmisartan (MICARDIS) 40 MG tablet, Comprehensive metabolic panel  -Stable on current regimen, check labs  OSA (obstructive sleep apnea)  -Severe by prior testing.  Denies daytime symptoms at this time.  Recommended repeat testing, I can place that referral if he is ready.  Screen for colon cancer - Plan: Cologuard  Hyperlipidemia, unspecified hyperlipidemia type - Plan: rosuvastatin (CRESTOR) 5 MG tablet, Comprehensive metabolic panel, Lipid panel  -Tolerating Crestor, check updated labs.  Continue same.  May need higher dose as off Zetia.  Prediabetes - Plan: Hemoglobin A1c, Hemoglobin A1c Class 3 severe obesity with serious comorbidity and body mass index (BMI) of 45.0 to 49.9  in adult, unspecified obesity type (HCC)  -Commended on  lifestyle modification with diet changes, exercise and weight loss.  Anticipate that will improve multiple areas of health including previous borderline A1c.  Repeat A1c today, continue to watch diet, exercise.  Option of medical or surgical bariatrics eval if difficulty with weight loss.  Heartburn  -Stable Zantac, anticipate improvement with change in late-night meals with option of intermittent dosing.  Right shoulder pain, unspecified chronicity - Plan: Ambulatory referral to Orthopedic Surgery  -Timing of symptoms and exam suspicious for component of adhesive capsulitis.  Will refer to orthopedics for further evaluation, consideration of imaging.  Meds ordered this encounter  Medications   rosuvastatin (CRESTOR) 5 MG tablet    Sig: Take 1 tablet (5 mg total) by mouth daily.    Dispense:  90 tablet    Refill:  1   atenolol (TENORMIN) 50 MG tablet    Sig: Take 1 tablet (50 mg total) by mouth daily.    Dispense:  90 tablet    Refill:  1    No more refills on this Rx. Provider no longer in office. Patient must find new provider to continue refills.   telmisartan (MICARDIS) 40 MG tablet    Sig: Take 1 tablet (40 mg total) by mouth daily.    Dispense:  90 tablet    Refill:  1    No more refills on this Rx. Provider no longer in office. Patient must find new provider to continue refills.   Patient Instructions  Based on prior severe sleep apnea, I would recommend updated testing and discussion with sleep specialist - let me know if I can place that referral.   Great work on the weight loss and diet changes. Keep up the walking and low intensity exercise.   Ok to try tapering zantac if less late night meals.   Depending on cholesterol levels we have option of increasing Crestor. I will let you know.   No change in blood pressure meds today.   I will refer you to orthopaedics for your shoulder.   Thanks for coming in today.         Signed,   Meredith Staggers, MD [facility], [facility] 05/18/21 10:42 AM

## 2021-05-18 NOTE — Patient Instructions (Addendum)
Based on prior severe sleep apnea, I would recommend updated testing and discussion with sleep specialist - let me know if I can place that referral.   Great work on the weight loss and diet changes. Keep up the walking and low intensity exercise.   Ok to try tapering zantac if less late night meals.   Depending on cholesterol levels we have option of increasing Crestor. I will let you know.   No change in blood pressure meds today.   I will refer you to orthopaedics for your shoulder.   Thanks for coming in today.

## 2021-05-19 ENCOUNTER — Other Ambulatory Visit: Payer: Self-pay | Admitting: Family

## 2021-05-19 DIAGNOSIS — E785 Hyperlipidemia, unspecified: Secondary | ICD-10-CM

## 2021-05-24 ENCOUNTER — Other Ambulatory Visit (HOSPITAL_BASED_OUTPATIENT_CLINIC_OR_DEPARTMENT_OTHER): Payer: Self-pay | Admitting: Orthopaedic Surgery

## 2021-05-24 DIAGNOSIS — M25511 Pain in right shoulder: Secondary | ICD-10-CM

## 2021-05-25 ENCOUNTER — Ambulatory Visit (INDEPENDENT_AMBULATORY_CARE_PROVIDER_SITE_OTHER): Payer: 59 | Admitting: Orthopaedic Surgery

## 2021-05-25 ENCOUNTER — Ambulatory Visit (HOSPITAL_BASED_OUTPATIENT_CLINIC_OR_DEPARTMENT_OTHER)
Admission: RE | Admit: 2021-05-25 | Discharge: 2021-05-25 | Disposition: A | Discharge status: Home / Self Care | Payer: 59 | Source: Ambulatory Visit | Attending: Orthopaedic Surgery | Admitting: Orthopaedic Surgery

## 2021-05-25 ENCOUNTER — Other Ambulatory Visit: Payer: Self-pay

## 2021-05-25 DIAGNOSIS — M25511 Pain in right shoulder: Secondary | ICD-10-CM | POA: Diagnosis not present

## 2021-05-25 DIAGNOSIS — S46011A Strain of muscle(s) and tendon(s) of the rotator cuff of right shoulder, initial encounter: Secondary | ICD-10-CM

## 2021-05-25 NOTE — Progress Notes (Signed)
Chief Complaint: right shoulder pain     History of Present Illness:    Micheal Mckinney "Micheal Mckinney" is a 50 y.o. male RHD presents with right shoulder pain for approximately 1 year after he was lifting a approximately 450 pound pool motor.  Since that time the shoulder has not felt right.  He has had significant difficulty laying directly on the side.  He endorses weakness and pain about the shoulder.  Describes the pain as sharp.  He has been trying to get more active and work out recently although his upper body workouts have been quite limited due to pain and weakness in the shoulder.  He has been taking ice and heat.  He works as a Haematologist.  He has previously done physical therapy for the neck as he has a previous history of cervical disc replacement.    Surgical History:   None  PMH/PSH/Family History/Social History/Meds/Allergies:    Past Medical History:  Diagnosis Date   Allergy    hay fever   Foreign body (FB) in soft tissue    right palm   GERD (gastroesophageal reflux disease)    History of chicken pox    Hyperlipidemia    Hypertension    OSA (obstructive sleep apnea) 01/26/2012   does not use CPAP   Past Surgical History:  Procedure Laterality Date   CARPAL TUNNEL RELEASE Bilateral 05/04/2013   CERVICAL FUSION     Alternative to this done in 05/2017, disc replacement   FOREIGN BODY REMOVAL Right 05/18/2018   Procedure: REMOVAL FOREIGN BODY RIGHT PALM;  Surgeon: Betha Loa, MD;  Location: [facility];  Service: Orthopedics;  Laterality: Right;   IRRIGATION AND DEBRIDEMENT SEBACEOUS CYST  2009    on back and neck   Social History   Socioeconomic History   Marital status: Married    Spouse name: Not on file   Number of children: Not on file   Years of education: Not on file   Highest education level: Not on file  Occupational History   Not on file  Tobacco Use   Smoking status: Former     Packs/day: 0.25    Years: 25.00    Pack years: 6.25    Types: Cigarettes    Quit date: 01/16/2020    Years since quitting: 1.3   Smokeless tobacco: Never  Vaping Use   Vaping Use: Never used  Substance and Sexual Activity   Alcohol use: No    Alcohol/week: 0.0 standard drinks   Drug use: No   Sexual activity: Yes  Other Topics Concern   Not on file  Social History Narrative   Lives with wife and dogs   Social Determinants of Health   Financial Resource Strain: Not on file  Food Insecurity: Not on file  Transportation Needs: Not on file  Physical Activity: Not on file  Stress: Not on file  Social Connections: Not on file   Family History  Problem Relation Age of Onset   Cancer Mother        breast   Stroke Father    No Known Allergies Current Outpatient Medications  Medication Sig Dispense Refill   aspirin 81 MG tablet Take 81 mg by mouth daily.     atenolol (TENORMIN) 50 MG tablet Take 1 tablet (50 mg  total) by mouth daily. 90 tablet 1   ezetimibe (ZETIA) 10 MG tablet TAKE ONE TABLET BY MOUTH DAILY 90 tablet 0   ranitidine (ZANTAC) 150 MG tablet Take 1 tablet by mouth as needed.     rosuvastatin (CRESTOR) 5 MG tablet Take 1 tablet (5 mg total) by mouth daily. 90 tablet 1   telmisartan (MICARDIS) 40 MG tablet Take 1 tablet (40 mg total) by mouth daily. 90 tablet 1   No current facility-administered medications for this visit.   No results found.  Review of Systems:   A ROS was performed including pertinent positives and negatives as documented in the HPI.  Physical Exam :   Constitutional: NAD and appears stated age Neurological: Alert and oriented Psych: Appropriate affect and cooperative There were no vitals taken for this visit.   Comprehensive Musculoskeletal Exam:    Musculoskeletal Exam    Inspection Right Left  Skin No atrophy or winging No atrophy or winging  Palpation    Tenderness Lateral deltoid None  Range of Motion    Flexion (passive) 150  170  Flexion (active) 120 170  Abduction 100 170  ER at the side 30 70  Can reach behind back to T12 T12  Strength     5 supra infraspinatus None  Special Tests    Pseudoparalytic No No  Neurologic    Fires PIN, radial, median, ulnar, musculocutaneous, axillary, suprascapular, long thoracic, and spinal accessory innervated muscles. No abnormal sensibility  Vascular/Lymphatic    Radial Pulse 2+ 2+  Cervical Exam    Patient has symmetric cervical range of motion with negative Spurling's test.  Special Test:      Imaging:   Xray (3 views right shoulder): AC joint osteoarthritis.  Otherwise normal    I personally reviewed and interpreted the radiographs.   Assessment:   50 year old male with right shoulder pain now going on for a year after an acute injury where he was lifting a motor.  At this time I am concerned for a traumatic rotator cuff injury.  He does demonstrate weakness and pain consistent with rotator cuff injury.  As result I would like to obtain an MRI to assess the underlying rotator cuff.  Plan :    -Plan for MRI right shoulder -Return to clinic following MRI for discussion of results   I believe that advance imaging in the form of an MRI is indicated for the following reasons: -Xrays images were obtained and not diagnostic -The patient has failed treatment modalities including home exercise program, activity modification, ice, heat -The following worrisome symptoms are present on history and exam: Weakness in the setting of traumatic mechanism     I personally saw and evaluated the patient, and participated in the management and treatment plan.  Huel Cote, MD Attending Physician, Orthopedic Surgery  This document was dictated using Dragon voice recognition software. A reasonable attempt at proof reading has been made to minimize errors.

## 2021-05-28 ENCOUNTER — Ambulatory Visit
Admission: RE | Admit: 2021-05-28 | Discharge: 2021-05-28 | Disposition: A | Discharge status: Home / Self Care | Payer: 59 | Source: Ambulatory Visit | Attending: Orthopaedic Surgery | Admitting: Orthopaedic Surgery

## 2021-05-28 ENCOUNTER — Other Ambulatory Visit: Payer: Self-pay

## 2021-05-28 DIAGNOSIS — M25511 Pain in right shoulder: Secondary | ICD-10-CM

## 2021-05-29 ENCOUNTER — Other Ambulatory Visit: Payer: Self-pay | Admitting: Family Medicine

## 2021-05-29 NOTE — Progress Notes (Signed)
See labs. Order reentered for future labs.

## 2021-06-15 ENCOUNTER — Ambulatory Visit (HOSPITAL_BASED_OUTPATIENT_CLINIC_OR_DEPARTMENT_OTHER): Payer: 59 | Admitting: Orthopaedic Surgery

## 2021-06-22 ENCOUNTER — Ambulatory Visit (HOSPITAL_BASED_OUTPATIENT_CLINIC_OR_DEPARTMENT_OTHER): Payer: 59 | Admitting: Orthopaedic Surgery

## 2021-06-25 ENCOUNTER — Ambulatory Visit (HOSPITAL_BASED_OUTPATIENT_CLINIC_OR_DEPARTMENT_OTHER): Payer: 59 | Admitting: Orthopaedic Surgery

## 2021-06-25 ENCOUNTER — Other Ambulatory Visit: Payer: Self-pay

## 2021-06-25 DIAGNOSIS — M25511 Pain in right shoulder: Secondary | ICD-10-CM

## 2021-06-25 MED ORDER — TRIAMCINOLONE ACETONIDE 40 MG/ML IJ SUSP
80.0000 mg | INTRAMUSCULAR | Status: AC | PRN
  Administered 2021-06-25: 80 mg via INTRA_ARTICULAR

## 2021-06-25 MED ORDER — LIDOCAINE HCL 1 % IJ SOLN
4.0000 mL | INTRAMUSCULAR | Status: AC | PRN
  Administered 2021-06-25: 4 mL

## 2021-06-25 NOTE — Progress Notes (Signed)
Chief Complaint: right shoulder pain     History of Present Illness:   06/25/2021: Micheal Mckinney presents today for ongoing right shoulder pain.  He states that he has been using the shoulder more since he got the results of his MRI scan.  Overall the shoulder does feel better.  He continues to be extremely busy with work.  Micheal Mckinney "Micheal Mckinney" is a 50 y.o. male RHD presents with right shoulder pain for approximately 1 year after he was lifting a approximately 450 pound pool motor.  Since that time the shoulder has not felt right.  He has had significant difficulty laying directly on the side.  He endorses weakness and pain about the shoulder.  Describes the pain as sharp.  He has been trying to get more active and work out recently although his upper body workouts have been quite limited due to pain and weakness in the shoulder.  He has been taking ice and heat.  He works as a Haematologist.  He has previously done physical therapy for the neck as he has a previous history of cervical disc replacement.    Surgical History:   None  PMH/PSH/Family History/Social History/Meds/Allergies:    Past Medical History:  Diagnosis Date  . Allergy    hay fever  . Foreign body (FB) in soft tissue    right palm  . GERD (gastroesophageal reflux disease)   . History of chicken pox   . Hyperlipidemia   . Hypertension   . OSA (obstructive sleep apnea) 01/26/2012   does not use CPAP   Past Surgical History:  Procedure Laterality Date  . CARPAL TUNNEL RELEASE Bilateral 05/04/2013  . CERVICAL FUSION     Alternative to this done in 05/2017, disc replacement  . FOREIGN BODY REMOVAL Right 05/18/2018   Procedure: REMOVAL FOREIGN BODY RIGHT PALM;  Surgeon: Betha Loa, MD;  Location: [facility];  Service: Orthopedics;  Laterality: Right;  . IRRIGATION AND DEBRIDEMENT SEBACEOUS CYST  2009    on back and neck   Social History   Socioeconomic  History  . Marital status: Married    Spouse name: Not on file  . Number of children: Not on file  . Years of education: Not on file  . Highest education level: Not on file  Occupational History  . Not on file  Tobacco Use  . Smoking status: Former    Packs/day: 0.25    Years: 25.00    Pack years: 6.25    Types: Cigarettes    Quit date: 01/16/2020    Years since quitting: 1.4  . Smokeless tobacco: Never  Vaping Use  . Vaping Use: Never used  Substance and Sexual Activity  . Alcohol use: No    Alcohol/week: 0.0 standard drinks  . Drug use: No  . Sexual activity: Yes  Other Topics Concern  . Not on file  Social History Narrative   Lives with wife and dogs   Social Determinants of Health   Financial Resource Strain: Not on file  Food Insecurity: Not on file  Transportation Needs: Not on file  Physical Activity: Not on file  Stress: Not on file  Social Connections: Not on file   Family History  Problem Relation Age of Onset  . Cancer Mother  breast  . Stroke Father    No Known Allergies Current Outpatient Medications  Medication Sig Dispense Refill  . aspirin 81 MG tablet Take 81 mg by mouth daily.    Marland Kitchen atenolol (TENORMIN) 50 MG tablet Take 1 tablet (50 mg total) by mouth daily. 90 tablet 1  . ezetimibe (ZETIA) 10 MG tablet TAKE ONE TABLET BY MOUTH DAILY 90 tablet 0  . ranitidine (ZANTAC) 150 MG tablet Take 1 tablet by mouth as needed.    . rosuvastatin (CRESTOR) 5 MG tablet Take 1 tablet (5 mg total) by mouth daily. 90 tablet 1  . telmisartan (MICARDIS) 40 MG tablet Take 1 tablet (40 mg total) by mouth daily. 90 tablet 1   No current facility-administered medications for this visit.   No results found.  Review of Systems:   A ROS was performed including pertinent positives and negatives as documented in the HPI.  Physical Exam :   Constitutional: NAD and appears stated age Neurological: Alert and oriented Psych: Appropriate affect and  cooperative There were no vitals taken for this visit.   Comprehensive Musculoskeletal Exam:    Musculoskeletal Exam    Inspection Right Left  Skin No atrophy or winging No atrophy or winging  Palpation    Tenderness Lateral deltoid None  Range of Motion    Flexion (passive) 150 170  Flexion (active) 120 170  Abduction 100 170  ER at the side 30 70  Can reach behind back to T12 T12  Strength     5 supra infraspinatus None  Special Tests    Pseudoparalytic No No  Neurologic    Fires PIN, radial, median, ulnar, musculocutaneous, axillary, suprascapular, long thoracic, and spinal accessory innervated muscles. No abnormal sensibility  Vascular/Lymphatic    Radial Pulse 2+ 2+  Cervical Exam    Patient has symmetric cervical range of motion with negative Spurling's test.  Special Test:      Imaging:   Xray (3 views right shoulder): AC joint osteoarthritis.  Otherwise normal  MRI right shoulder: There is tendinosis involving the supra as well as infraspinatus.   I personally reviewed and interpreted the radiographs.   Assessment:   50 year old male with right shoulder pain now going on for a year after an acute injury where he was lifting a motor.  He has right shoulder rotator cuff tendinitis which is improving since he has been using it more.  I would like to plan for 1 physical therapy session so that he can get a home exercise program.  He was instructed on purchasing bands as well for home that he can use to strengthen his rotator cuff.  I have offered him an ultrasound-guided injection of the right shoulder to assist with physical therapy.  He would like to proceed with this.  Plan :    -Plan for right shoulder ultrasound-guided steroid injection    Procedure Note  Patient: Micheal Mckinney             Date of Birth: 10/09/1970           MRN: [id number]             Visit Date: 06/25/2021  Procedures: Visit Diagnoses:  1. Right shoulder pain, unspecified  chronicity     Large Joint Inj: R subacromial bursa on 06/25/2021 8:42 AM Indications: pain Details: 22 G 1.5 in needle, ultrasound-guided anterior approach  Arthrogram: No  Medications: 4 mL lidocaine 1 %; 80 mg triamcinolone acetonide 40 MG/ML Outcome: tolerated  well, no immediate complications Procedure, treatment alternatives, risks and benefits explained, specific risks discussed. Consent was given by the patient. Immediately prior to procedure a time out was called to verify the correct patient, procedure, equipment, support staff and site/side marked as required. Patient was prepped and draped in the usual sterile fashion.       I personally saw and evaluated the patient, and participated in the management and treatment plan.  Huel Cote, MD Attending Physician, Orthopedic Surgery  This document was dictated using Dragon voice recognition software. A reasonable attempt at proof reading has been made to minimize errors.

## 2021-07-06 ENCOUNTER — Ambulatory Visit: Payer: 59 | Admitting: Physical Therapy

## 2021-07-10 ENCOUNTER — Ambulatory Visit: Payer: 59 | Admitting: Physical Therapy

## 2021-08-01 ENCOUNTER — Other Ambulatory Visit: Payer: Self-pay | Admitting: Family

## 2021-08-01 DIAGNOSIS — E785 Hyperlipidemia, unspecified: Secondary | ICD-10-CM

## 2021-08-27 ENCOUNTER — Ambulatory Visit (HOSPITAL_BASED_OUTPATIENT_CLINIC_OR_DEPARTMENT_OTHER): Payer: 59 | Admitting: Orthopaedic Surgery

## 2021-10-10 ENCOUNTER — Telehealth: Payer: Self-pay

## 2021-10-10 NOTE — Telephone Encounter (Signed)
MEDICATION: atenolol (TENORMIN) 50 MG tablet // rosuvastatin (CRESTOR) 5 MG tablet (Expired) ? ?PHARMACY: [id number] - [place], [place] ? ?Comments: Patients wife is calling in stating that [facility] said it was denied but is wondering why,  ? ?**Let patient know to contact pharmacy at the end of the day to make sure medication is ready. ** ? ?** Please notify patient to allow 48-72 hours to process** ? ?**Encourage patient to contact the pharmacy for refills or they can request refills through [facility]** ? ? ?

## 2021-10-11 ENCOUNTER — Other Ambulatory Visit: Payer: Self-pay

## 2021-10-11 DIAGNOSIS — E785 Hyperlipidemia, unspecified: Secondary | ICD-10-CM

## 2021-10-11 DIAGNOSIS — I10 Essential (primary) hypertension: Secondary | ICD-10-CM

## 2021-10-11 MED ORDER — ATENOLOL 50 MG PO TABS: 50.0000 mg | ORAL_TABLET | Freq: Every day | ORAL | 1 refills | Status: DC

## 2021-10-11 MED ORDER — ROSUVASTATIN CALCIUM 5 MG PO TABS: 5.0000 mg | ORAL_TABLET | Freq: Every day | ORAL | 1 refills | Status: DC

## 2021-10-11 NOTE — Telephone Encounter (Signed)
Sent pt needs to keep appt in may, loooks like duplicate order was denied  ?

## 2021-10-11 NOTE — Telephone Encounter (Signed)
Called and informed pt.  

## 2021-11-08 ENCOUNTER — Other Ambulatory Visit: Payer: Self-pay | Admitting: Family Medicine

## 2021-11-08 ENCOUNTER — Telehealth: Payer: Self-pay | Admitting: Family Medicine

## 2021-11-08 DIAGNOSIS — I10 Essential (primary) hypertension: Secondary | ICD-10-CM

## 2021-11-08 NOTE — Telephone Encounter (Signed)
Pt needs a refill on the Ezetimibe sent to the [facility] on [address] [place]  ? ?Please advise next appt is 11/16/21 with Neva Seat  ?

## 2021-11-09 ENCOUNTER — Other Ambulatory Visit: Payer: Self-pay

## 2021-11-09 DIAGNOSIS — E785 Hyperlipidemia, unspecified: Secondary | ICD-10-CM

## 2021-11-09 MED ORDER — EZETIMIBE 10 MG PO TABS: 10.0000 mg | ORAL_TABLET | Freq: Every day | ORAL | 0 refills | Status: DC

## 2021-11-09 NOTE — Telephone Encounter (Signed)
Sent!

## 2021-11-16 ENCOUNTER — Ambulatory Visit: Payer: 59 | Admitting: Family Medicine

## 2021-11-16 ENCOUNTER — Other Ambulatory Visit: Payer: Self-pay

## 2021-11-16 ENCOUNTER — Encounter: Payer: Self-pay | Admitting: Family Medicine

## 2021-11-16 VITALS — BP 126/78 | HR 63 | Temp 98.3°F | Resp 18 | Ht 68.0 in | Wt 297.0 lb

## 2021-11-16 DIAGNOSIS — E785 Hyperlipidemia, unspecified: Secondary | ICD-10-CM | POA: Diagnosis not present

## 2021-11-16 DIAGNOSIS — R7303 Prediabetes: Secondary | ICD-10-CM

## 2021-11-16 DIAGNOSIS — Z1211 Encounter for screening for malignant neoplasm of colon: Secondary | ICD-10-CM

## 2021-11-16 DIAGNOSIS — I10 Essential (primary) hypertension: Secondary | ICD-10-CM

## 2021-11-16 LAB — HEMOGLOBIN A1C: Hgb A1c MFr Bld: 5.5 % (ref 4.6–6.5)

## 2021-11-16 LAB — COMPREHENSIVE METABOLIC PANEL
ALT: 23 U/L (ref 0–53)
AST: 18 U/L (ref 0–37)
Albumin: 4.2 g/dL (ref 3.5–5.2)
Alkaline Phosphatase: 61 U/L (ref 39–117)
BUN: 17 mg/dL (ref 6–23)
CO2: 24 mEq/L (ref 19–32)
Calcium: 9.6 mg/dL (ref 8.4–10.5)
Chloride: 107 mEq/L (ref 96–112)
Creatinine, Ser: 0.67 mg/dL (ref 0.40–1.50)
GFR: 108.43 mL/min (ref >60.00)
Glucose, Bld: 107 mg/dL — ABNORMAL HIGH (ref 70–99)
Potassium: 4.2 mEq/L (ref 3.5–5.1)
Sodium: 138 mEq/L (ref 135–145)
Total Bilirubin: 0.4 mg/dL (ref 0.2–1.2)
Total Protein: 6.4 g/dL (ref 6.0–8.3)

## 2021-11-16 LAB — LIPID PANEL
Cholesterol: 95 mg/dL (ref 0–200)
HDL: 28.1 mg/dL — ABNORMAL LOW (ref >39.00)
LDL Cholesterol: 55 mg/dL (ref 0–99)
NonHDL: 66.96
Total CHOL/HDL Ratio: 3
Triglycerides: 61 mg/dL (ref 0.0–149.0)
VLDL: 12.2 mg/dL (ref 0.0–40.0)

## 2021-11-16 MED ORDER — ROSUVASTATIN CALCIUM 5 MG PO TABS: 5.0000 mg | ORAL_TABLET | Freq: Every day | ORAL | 3 refills | Status: DC

## 2021-11-16 MED ORDER — TELMISARTAN 40 MG PO TABS: 40.0000 mg | ORAL_TABLET | Freq: Every day | ORAL | 3 refills | Status: DC

## 2021-11-16 MED ORDER — ATENOLOL 50 MG PO TABS: 50.0000 mg | ORAL_TABLET | Freq: Every day | ORAL | 3 refills | Status: DC

## 2021-11-16 MED ORDER — EZETIMIBE 10 MG PO TABS: 10.0000 mg | ORAL_TABLET | Freq: Every day | ORAL | 3 refills | Status: DC

## 2021-11-16 NOTE — Patient Instructions (Addendum)
Great work on the [employer] loss! ?Ok to try spacing out the zantac to as needed or most days per week.  ?No medication changes at this time.  If there are any concerns on your labs I will let you know.  Follow-up in 6 months for a physical but let me know if there are questions sooner.  ? ?If you have lab work done today you will be contacted with your lab results within the next 2 weeks.  If you have not heard from [place] then please contact us. The fastest way to get your results is to register for My Chart. ? ? ?IF you received an x-ray today, you will receive an invoice from [facility]. Please contact [facility] at [phone] with questions or concerns regarding your invoice.  ? ?IF you received labwork today, you will receive an invoice from [facility]. Please contact [facility] at [phone] with questions or concerns regarding your invoice.  ? ?Our billing staff will not be able to assist you with questions regarding bills from these companies. ? ?You will be contacted with the lab results as soon as they are available. The fastest way to get your results is to activate your My Chart account. Instructions are located on the last page of this paperwork. If you have not heard from [place] regarding the results in 2 weeks, please contact this office. ?  ? ? ?

## 2021-11-16 NOTE — Progress Notes (Signed)
? ?Subjective:  ?Patient ID: Micheal Mckinney, male    DOB: May 29, 1971  Age: 51 y.o. MRN: [id number] ? ?CC:  ?Chief Complaint  ?Patient presents with  ? Medication Refill  ?  Patient is follow up for medication refill and and medication review.  ? ? ?HPI ?Micheal Mckinney presents for  ? ?Prediabetes ?Weight loss as below with diet/exercise changes ?Lab Results  ?Component Value Date  ? HGBA1C 5.7 05/18/2021  ? ?Wt Readings from Last 3 Encounters:  ?11/16/21 297 lb (134.7 kg)  ?05/18/21 (!) 307 lb 9.6 oz (139.5 kg)  ?08/10/20 (!) 318 lb 3.2 oz (144.3 kg)  ? ?Hypertension: ?With history of OSA, severe in the past, unable to tolerate CPAP.  Cardiology/lipid specialist Dr. Debara Pickett. ?Atenolol 50 mg daily, telmisartan 40 mg daily. No new side effects.  ?Home readings: none recently.  ?BP Readings from Last 3 Encounters:  ?11/16/21 126/78  ?05/18/21 130/78  ?08/10/20 136/75  ? ?Lab Results  ?Component Value Date  ? CREATININE 0.94 05/18/2021  ? ?Hyperlipidemia: ?Treated with Crestor, and Zetia.  72nd percentile with coronary calcium scoring.  Target LDL of 70 or lower.  Followed by lipid clinic as above.  Most recent labs in November. ?Min creamer in coffee today.  No new side effects or myalgias.  Compliant with meds. ?Lab Results  ?Component Value Date  ? CHOL 116 05/18/2021  ? HDL 35.10 (L) 05/18/2021  ? Logan 69 05/18/2021  ? LDLDIRECT 169.6 04/13/2014  ? TRIG 64.0 05/18/2021  ? CHOLHDL 3 05/18/2021  ? ?Lab Results  ?Component Value Date  ? ALT 29 05/18/2021  ? AST 21 05/18/2021  ? ALKPHOS 64 05/18/2021  ? BILITOT 0.5 05/18/2021  ? ?GERD ?Zantac 150 mg daily.  Adjusted late-night meals. 1/2 per day now - doing well.  ? 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  Drinks water, usually cooks at home and has adjusted his timing of eating.  Weight has improved since last visit.  More weight loss on scale at home. 25#. Increased activity. Building a garage at home. More physical activity. ?Body mass index  is 45.16 kg/m?. ?Wt Readings from Last 3 Encounters:  ?11/16/21 297 lb (134.7 kg)  ?05/18/21 (!) 307 lb 9.6 oz (139.5 kg)  ?08/10/20 (!) 318 lb 3.2 oz (144.3 kg)  ? ? ?History ?Patient Active Problem List  ? Diagnosis Date Noted  ? Visit for preventive health examination 10/31/2015  ? 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 Hyperlipidemia 05/10/2013  ? Routine general medical examination at a health care facility 05/07/2013  ? Carpal tunnel syndrome 01/12/2013  ? HTN (hypertension) 01/26/2012  ? OSA (obstructive sleep apnea) 01/26/2012  ? Obesity 01/26/2012  ? ?Past Medical History:  ?Diagnosis Date  ? Allergy   ? hay fever  ? Foreign body (FB) in soft tissue   ? right palm  ? GERD (gastroesophageal reflux disease)   ? History of chicken pox   ? Hyperlipidemia   ? Hypertension   ? OSA (obstructive sleep apnea) 01/26/2012  ? does not use CPAP  ? ?Past Surgical History:  ?Procedure Laterality Date  ? CARPAL TUNNEL RELEASE Bilateral 05/04/2013  ? CERVICAL FUSION    ? Alternative to this done in 05/2017, disc replacement  ? FOREIGN BODY REMOVAL Right 05/18/2018  ? Procedure: REMOVAL FOREIGN BODY RIGHT PALM;  Surgeon: Leanora Cover, MD;  Location: [facility];  Service: Orthopedics;  Laterality: Right;  ? IRRIGATION AND DEBRIDEMENT SEBACEOUS  CYST  2009  ?  on back and neck  ? ?No Known Allergies ?Prior to Admission medications   ?Medication Sig Start Date End Date Taking? Authorizing Provider  ?aspirin 81 MG tablet Take 81 mg by mouth daily.   Yes [provider]  ?atenolol (TENORMIN) 50 MG tablet Take 1 tablet (50 mg total) by mouth daily. 10/11/21  Yes Wendie Agreste, MD  ?ezetimibe (ZETIA) 10 MG tablet Take 1 tablet (10 mg total) by mouth daily. 11/09/21  Yes Wendie Agreste, MD  ?ranitidine (ZANTAC) 150 MG tablet Take 1 tablet by mouth as needed.   Yes [provider]  ?rosuvastatin (CRESTOR) 5 MG tablet Take 1 tablet (5 mg total) by mouth daily. 10/11/21 01/09/22 Yes Wendie Agreste, MD  ?telmisartan (MICARDIS) 40 MG tablet TAKE ONE TABLET BY MOUTH DAILY 11/09/21  Yes Wendie Agreste, MD  ? ?Social History  ? ?Socioeconomic History  ? Marital status: Married  ?  Spouse name: Not on file  ? Number of children: Not on file  ? Years of education: Not on file  ? Highest education level: Not on file  ?Occupational History  ? Not on file  ?Tobacco Use  ? Smoking status: Former  ?  Packs/day: 0.25  ?  Years: 25.00  ?  Pack years: 6.25  ?  Types: Cigarettes  ?  Quit date: 01/16/2020  ?  Years since quitting: 1.8  ? Smokeless tobacco: Never  ?Vaping Use  ? Vaping Use: Never used  ?Substance and Sexual Activity  ? Alcohol use: No  ?  Alcohol/week: 0.0 standard drinks  ? Drug use: No  ? Sexual activity: Yes  ?Other Topics Concern  ? Not on file  ?Social History Narrative  ? Lives with wife and dogs  ? ?Social Determinants of Health  ? ?Financial Resource Strain: Not on file  ?Food Insecurity: Not on file  ?Transportation Needs: Not on file  ?Physical Activity: Not on file  ?Stress: Not on file  ?Social Connections: Not on file  ?Intimate Partner Violence: Not on file  ? ? ?Review of Systems  ?Constitutional:  Negative for fatigue and unexpected weight change.  ?Eyes:  Negative for visual disturbance.  ?Respiratory:  Negative for cough, chest tightness and shortness of breath.   ?Cardiovascular:  Negative for chest pain, palpitations and leg swelling.  ?Gastrointestinal:  Negative for abdominal pain and blood in stool.  ?Neurological:  Negative for dizziness, light-headedness and headaches.  ? ? ?Objective:  ? ?Vitals:  ? 11/16/21 0847  ?BP: 126/78  ?Pulse: 63  ?Resp: 18  ?Temp: 98.3 ?F (36.8 ?C)  ?TempSrc: Temporal  ?SpO2: 94%  ?Weight: 297 lb (134.7 kg)  ?Height: $RemoveB'5\' 8"'[id number]$  (1.727 m)  ? ? ? ?Physical Exam ?Vitals reviewed.  ?Constitutional:   ?   Appearance: He is well-developed.  ?HENT:  ?   Head: Normocephalic and atraumatic.  ?Neck:  ?   Vascular: No carotid bruit or JVD.  ?Cardiovascular:  ?    Rate and Rhythm: Normal rate and regular rhythm.  ?   Heart sounds: Normal heart sounds. No murmur heard. ?Pulmonary:  ?   Effort: Pulmonary effort is normal.  ?   Breath sounds: Normal breath sounds. No rales.  ?Musculoskeletal:  ?   Right lower leg: No edema.  ?   Left lower leg: No edema.  ?Skin: ?   General: Skin is warm and dry.  ?Neurological:  ?   Mental Status: He is alert  and oriented to person, place, and time.  ?Psychiatric:     ?   Mood and Affect: Mood normal.  ? ? ?Assessment & Plan:  ?Micheal Mckinney is a 51 y.o. male . ?Hyperlipidemia, unspecified hyperlipidemia type - Plan: Lipid panel, ezetimibe (ZETIA) 10 MG tablet, rosuvastatin (CRESTOR) 5 MG tablet ? -Commended on weight loss, anticipate improvement in readings.  Continue Zetia, Crestor same doses for now as tolerating. ? ?Essential hypertension - Plan: Comprehensive metabolic panel, telmisartan (MICARDIS) 40 MG tablet, atenolol (TENORMIN) 50 MG tablet ? -Stable, improved readings.  Continue same dose of atenolol, Micardis for now with RTC precautions, option to lower doses if lower readings at home as weight improves.  History of OSA but intolerant to CPAP.  Should also improve with weight loss. ? ?Prediabetes - Plan: Hemoglobin A1c ? -Borderline prediabetes based on last A1c.  Anticipate this will now be normal with weight loss.  Check A1c ? ?Screen for colon cancer - Plan: Cologuard ? -Last testing kit was in the rain, needs new one ordered. ? ?40-month follow-up for physical ? ?Meds ordered this encounter  ?Medications  ? telmisartan (MICARDIS) 40 MG tablet  ?  Sig: Take 1 tablet (40 mg total) by mouth daily.  ?  Dispense:  90 tablet  ?  Refill:  3  ? ezetimibe (ZETIA) 10 MG tablet  ?  Sig: Take 1 tablet (10 mg total) by mouth daily.  ?  Dispense:  90 tablet  ?  Refill:  3  ? rosuvastatin (CRESTOR) 5 MG tablet  ?  Sig: Take 1 tablet (5 mg total) by mouth daily.  ?  Dispense:  90 tablet  ?  Refill:  3  ? atenolol (TENORMIN) 50 MG tablet  ?   Sig: Take 1 tablet (50 mg total) by mouth daily.  ?  Dispense:  90 tablet  ?  Refill:  3  ? ?Patient Instructions  ?Great work on the [employer] loss! ?Ok to try spacing out the zantac to as needed or mo

## 2022-03-08 ENCOUNTER — Encounter (HOSPITAL_BASED_OUTPATIENT_CLINIC_OR_DEPARTMENT_OTHER): Payer: Self-pay

## 2022-03-08 ENCOUNTER — Ambulatory Visit (HOSPITAL_BASED_OUTPATIENT_CLINIC_OR_DEPARTMENT_OTHER): Payer: 59 | Admitting: Orthopaedic Surgery

## 2022-03-13 ENCOUNTER — Ambulatory Visit: Payer: 59 | Admitting: Family Medicine

## 2022-03-13 ENCOUNTER — Ambulatory Visit (INDEPENDENT_AMBULATORY_CARE_PROVIDER_SITE_OTHER)
Admission: RE | Admit: 2022-03-13 | Discharge: 2022-03-13 | Disposition: A | Discharge status: Home / Self Care | Payer: 59 | Source: Ambulatory Visit | Attending: Family Medicine | Admitting: Family Medicine

## 2022-03-13 ENCOUNTER — Encounter: Payer: Self-pay | Admitting: Family Medicine

## 2022-03-13 DIAGNOSIS — M25522 Pain in left elbow: Secondary | ICD-10-CM | POA: Diagnosis not present

## 2022-03-13 DIAGNOSIS — M79645 Pain in left finger(s): Secondary | ICD-10-CM

## 2022-03-13 DIAGNOSIS — M25531 Pain in right wrist: Secondary | ICD-10-CM

## 2022-03-13 DIAGNOSIS — M25532 Pain in left wrist: Secondary | ICD-10-CM

## 2022-03-13 DIAGNOSIS — M25521 Pain in right elbow: Secondary | ICD-10-CM

## 2022-03-13 DIAGNOSIS — M79644 Pain in right finger(s): Secondary | ICD-10-CM | POA: Diagnosis not present

## 2022-03-13 DIAGNOSIS — M542 Cervicalgia: Secondary | ICD-10-CM

## 2022-03-13 DIAGNOSIS — S20212D Contusion of left front wall of thorax, subsequent encounter: Secondary | ICD-10-CM

## 2022-03-13 MED ORDER — CYCLOBENZAPRINE HCL 5 MG PO TABS
5.0000 mg | ORAL_TABLET | Freq: Three times a day (TID) | ORAL | 1 refills | Status: DC | PRN
Start: 2022-03-13 — End: 2022-11-13
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
## 2022-03-15 ENCOUNTER — Other Ambulatory Visit (HOSPITAL_BASED_OUTPATIENT_CLINIC_OR_DEPARTMENT_OTHER): Payer: Self-pay

## 2022-03-15 ENCOUNTER — Ambulatory Visit (INDEPENDENT_AMBULATORY_CARE_PROVIDER_SITE_OTHER): Payer: 59 | Admitting: Orthopaedic Surgery

## 2022-03-15 DIAGNOSIS — M25531 Pain in right wrist: Secondary | ICD-10-CM

## 2022-03-15 DIAGNOSIS — M25532 Pain in left wrist: Secondary | ICD-10-CM

## 2022-03-15 DIAGNOSIS — M25511 Pain in right shoulder: Secondary | ICD-10-CM | POA: Diagnosis not present

## 2022-03-15 DIAGNOSIS — M25512 Pain in left shoulder: Secondary | ICD-10-CM | POA: Diagnosis not present

## 2022-03-15 DIAGNOSIS — S46012A Strain of muscle(s) and tendon(s) of the rotator cuff of left shoulder, initial encounter: Secondary | ICD-10-CM | POA: Diagnosis not present

## 2022-03-15 DIAGNOSIS — M25521 Pain in right elbow: Secondary | ICD-10-CM

## 2022-03-15 MED ORDER — METHYLPREDNISOLONE 4 MG PO TBPK
ORAL_TABLET | ORAL | 0 refills | Status: DC
  Filled 2022-03-15: qty 21, 6d supply, fill #0

## 2022-03-15 MED ORDER — LIDOCAINE HCL 1 % IJ SOLN
4.0000 mL | INTRAMUSCULAR | Status: AC | PRN
  Administered 2022-03-15: 4 mL

## 2022-03-15 MED ORDER — TRIAMCINOLONE ACETONIDE 40 MG/ML IJ SUSP
80.0000 mg | INTRAMUSCULAR | Status: AC | PRN
  Administered 2022-03-15: 80 mg via INTRA_ARTICULAR
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
## 2022-03-19 ENCOUNTER — Telehealth: Payer: Self-pay | Admitting: Orthopaedic Surgery

## 2022-03-19 NOTE — Telephone Encounter (Signed)
Pt's wife Ander Slade called stated her husband has not been constipated for 3 days and unsure of what to do. Please call pt at at [phone]. Pt is not on any pain meds. Please call Joy at [phone].

## 2022-03-19 NOTE — Telephone Encounter (Signed)
Just FYI  CB patients wife and informed her of Dr. Serena Croissant recommendations. She states Micheal Mckinney's pain has subsided but his BP has been high since the injections and is having difficulty sleeping since he is uncomfortable since not having a bowel movement. I informed her she should call his cardiologist about the BP spike, but the injections typically do not cause constipation as a side effect. She understood and said she would have him try the Colace. No further questions asked.

## 2022-03-21 ENCOUNTER — Encounter: Payer: Self-pay | Admitting: Family Medicine

## 2022-03-21 ENCOUNTER — Ambulatory Visit: Payer: 59 | Admitting: Family Medicine

## 2022-03-21 VITALS — BP 138/70 | HR 56 | Temp 98.0°F | Ht 68.0 in | Wt 289.0 lb

## 2022-03-21 DIAGNOSIS — K5900 Constipation, unspecified: Secondary | ICD-10-CM

## 2022-03-21 DIAGNOSIS — R112 Nausea with vomiting, unspecified: Secondary | ICD-10-CM
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
## 2022-03-21 NOTE — Patient Instructions (Signed)
Glad to hear bowel movement did occur today.  MiraLAX may be a better option, once or twice per day.  If any diarrhea cut back on that dosing.  I would like to see the abdominal discomfort continue to improve today into tomorrow.  If any worsening pain, return of vomiting, fever or worsening symptoms be seen right away as you may need x-ray/imaging, and blood work.  Send me an update tomorrow on how you are doing.  Hang in there.   Constipation, Adult Constipation is when a person has fewer than three bowel movements in a week, has difficulty having a bowel movement, or has stools (feces) that are dry, hard, or larger than normal. Constipation may be caused by an underlying condition. It may become worse with age if a person takes certain medicines and does not take in enough fluids. Follow these instructions at home: Eating and drinking  Eat foods that have a lot of fiber, such as beans, whole grains, and fresh fruits and vegetables. Limit foods that are low in fiber and high in fat and processed sugars, such as fried or sweet foods. These include french fries, hamburgers, cookies, candies, and soda. Drink enough fluid to keep your urine pale yellow. General instructions Exercise regularly or as told by your health care provider. Try to do 150 minutes of moderate exercise each week. Use the bathroom when you have the urge to go. Do not hold it in. Take over-the-counter and prescription medicines only as told by your health care provider. This includes any fiber supplements. During bowel movements: Practice deep breathing while relaxing the lower abdomen. Practice pelvic floor relaxation. Watch your condition for any changes. Let your health care provider know about them. Keep all follow-up visits as told by your health care provider. This is important. Contact a health care provider if: You have pain that gets worse. You have a fever. You do not have a bowel movement after 4 days. You  vomit. You are not hungry or you lose weight. You are bleeding from the opening between the buttocks (anus). You have thin, pencil-like stools. Get help right away if: You have a fever and your symptoms suddenly get worse. You leak stool or have blood in your stool. Your abdomen is bloated. You have severe pain in your abdomen. You feel dizzy or you faint. Summary Constipation is when a person has fewer than three bowel movements in a week, has difficulty having a bowel movement, or has stools (feces) that are dry, hard, or larger than normal. Eat foods that have a lot of fiber, such as beans, whole grains, and fresh fruits and vegetables. Drink enough fluid to keep your urine pale yellow. Take over-the-counter and prescription medicines only as told by your health care provider. This includes any fiber supplements. This information is not intended to replace advice given to you by your health care provider. Make sure you discuss any questions you have with your health care provider. Document Revised: 05/19/2019 Document Reviewed: 05/19/2019 Elsevier Patient Education  2023 [employer].

## 2022-03-28 ENCOUNTER — Telehealth: Payer: Self-pay | Admitting: Family Medicine

## 2022-03-28 NOTE — Telephone Encounter (Signed)
Caller name: Ander Slade (wife)  On DPR? :yes/no: No  Call back number: [phone]  Provider they see: Neva Seat  Reason for call: pt still having trouble having BMs; wife wants to get referral to GI specialist.

## 2022-03-28 NOTE — Telephone Encounter (Signed)
Pt having trouble with Bowel movements and wife has requested referral to GI last OV 03/21/22

## 2022-03-29 ENCOUNTER — Ambulatory Visit: Payer: 59 | Admitting: Family Medicine
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
## 2022-04-04 ENCOUNTER — Ambulatory Visit: Payer: 59 | Admitting: Family Medicine

## 2022-04-24 ENCOUNTER — Ambulatory Visit (INDEPENDENT_AMBULATORY_CARE_PROVIDER_SITE_OTHER): Payer: 59 | Admitting: Orthopaedic Surgery

## 2022-04-24 DIAGNOSIS — M25511 Pain in right shoulder: Secondary | ICD-10-CM

## 2022-04-24 DIAGNOSIS — M25521 Pain in right elbow: Secondary | ICD-10-CM | POA: Diagnosis not present

## 2022-04-24 MED ORDER — TRIAMCINOLONE ACETONIDE 40 MG/ML IJ SUSP
80.0000 mg | INTRAMUSCULAR | Status: AC | PRN
  Administered 2022-04-24: 80 mg via INTRA_ARTICULAR

## 2022-04-24 MED ORDER — LIDOCAINE HCL 1 % IJ SOLN
4.0000 mL | INTRAMUSCULAR | Status: AC | PRN
  Administered 2022-04-24: 4 mL

## 2022-04-24 NOTE — Progress Notes (Signed)
Chief Complaint: right shoulder pain     History of Present Illness:   04/24/2022: Presents today for follow-up of his right elbow.  Overall his shoulder does feel much better after previous injection.  His right elbow had a very significant pain come on out of nowhere.  This has been painful in the anterior and posterior aspect of the joint.  Micheal Chang Penalver "Micheal Mckinney" is a 51 y.o. male RHD presents with right shoulder pain for approximately 1 year after he was lifting a approximately 450 pound pool motor.  Since that time the shoulder has not felt right.  He has had significant difficulty laying directly on the side.  He endorses weakness and pain about the shoulder.  Describes the pain as sharp.  He has been trying to get more active and work out recently although his upper body workouts have been quite limited due to pain and weakness in the shoulder.  He has been taking ice and heat.  He works as a Haematologist.  He has previously done physical therapy for the neck as he has a previous history of cervical disc replacement.    Surgical History:   None  PMH/PSH/Family History/Social History/Meds/Allergies:    Past Medical History:  Diagnosis Date   Allergy    hay fever   Foreign body (FB) in soft tissue    right palm   GERD (gastroesophageal reflux disease)    History of chicken pox    Hyperlipidemia    Hypertension    OSA (obstructive sleep apnea) 01/26/2012   does not use CPAP   Past Surgical History:  Procedure Laterality Date   CARPAL TUNNEL RELEASE Bilateral 05/04/2013   CERVICAL FUSION     Alternative to this done in 05/2017, disc replacement   FOREIGN BODY REMOVAL Right 05/18/2018   Procedure: REMOVAL FOREIGN BODY RIGHT PALM;  Surgeon: Betha Loa, MD;  [facility];  Service: Orthopedics;  Laterality: Right;   IRRIGATION AND DEBRIDEMENT SEBACEOUS CYST  2009    on back and neck   Social History    Socioeconomic History   Marital status: Married    Spouse name: Not on file   Number of children: Not on file   Years of education: Not on file   Highest education level: Not on file  Occupational History   Not on file  Tobacco Use   Smoking status: Former    Packs/day: 0.25    Years: 25.00    Total pack years: 6.25    Types: Cigarettes    Quit date: 01/16/2020    Years since quitting: 2.2   Smokeless tobacco: Never  Vaping Use   Vaping Use: Never used  Substance and Sexual Activity   Alcohol use: No    Alcohol/week: 0.0 standard drinks of alcohol   Drug use: No   Sexual activity: Yes  Other Topics Concern   Not on file  Social History Narrative   Lives with wife and dogs   Social Determinants of Health   Financial Resource Strain: Not on file  Food Insecurity: Not on file  Transportation Needs: Not on file  Physical Activity: Not on file  Stress: Not on file  Social Connections: Not on file   Family History  Problem Relation Age of Onset   Cancer Mother  breast   Stroke Father    No Known Allergies Current Outpatient Medications  Medication Sig Dispense Refill   aspirin 81 MG tablet Take 81 mg by mouth daily.     atenolol (TENORMIN) 50 MG tablet Take 1 tablet (50 mg total) by mouth daily. 90 tablet 3   cyclobenzaprine (FLEXERIL) 5 MG tablet Take 1 tablet (5 mg total) by mouth 3 (three) times daily as needed for muscle spasms (start qhs prn due to sedation). (Patient not taking: Reported on 03/21/2022) 15 tablet 1   ezetimibe (ZETIA) 10 MG tablet Take 1 tablet (10 mg total) by mouth daily. 90 tablet 3   methylPREDNISolone (MEDROL DOSEPAK) 4 MG TBPK tablet Take per packet instructions (Patient not taking: Reported on 03/21/2022) 21 tablet 0   ranitidine (ZANTAC) 150 MG tablet Take 1 tablet by mouth as needed.     rosuvastatin (CRESTOR) 5 MG tablet Take 1 tablet (5 mg total) by mouth daily. 90 tablet 3   telmisartan (MICARDIS) 40 MG tablet Take 1 tablet (40 mg  total) by mouth daily. 90 tablet 3   No current facility-administered medications for this visit.   No results found.  Review of Systems:   A ROS was performed including pertinent positives and negatives as documented in the HPI.  Physical Exam :   Constitutional: NAD and appears stated age Neurological: Alert and oriented Psych: Appropriate affect and cooperative There were no vitals taken for this visit.   Comprehensive Musculoskeletal Exam:    Musculoskeletal Exam    Inspection Right Left  Skin No atrophy or winging No atrophy or winging  Palpation    Tenderness Lateral deltoid Lateral deltoid  Range of Motion    Flexion (passive) 150 170  Flexion (active) 120 170  Abduction 100 170  ER at the side 30 70  Can reach behind back to T12 T12  Strength     5 supra infraspinatus None  Special Tests    Pseudoparalytic No No  Neurologic    Fires PIN, radial, median, ulnar, musculocutaneous, axillary, suprascapular, long thoracic, and spinal accessory innervated muscles. No abnormal sensibility  Vascular/Lymphatic    Radial Pulse 2+ 2+  Cervical Exam    Patient has symmetric cervical range of motion with negative Spurling's test.  Special Test: Positive Neer impingement bilaterally  Tenderness to palpation over medial epicondyle of right elbow as well as first dorsal compartment with positive Finkelstein bilaterally   Imaging:   Xray (right elbow 3 views, bilateral wrist 3 views): There is early osteoarthritic findings of the right elbow  MRI right shoulder: There is tendinosis involving the supra as well as infraspinatus.   I personally reviewed and interpreted the radiographs.   Assessment:   51 year old male with right elbow pain today consistent with a flareup of osteoarthritis.  Side effect I recommended ultrasound guided injection of the right elbow.  I described that I do believe this would likely give him complete relief.  I will plan to see him back as  needed  Plan :    -Plan for right elbow ultrasound-guided steroid injection    Procedure Note  Patient: KHOLE BRANCH             Date of Birth: [birth year]           MRN: [id number]             Visit Date: 04/24/2022  Procedures: Visit Diagnoses:  No diagnosis found.   Medium Joint Inj: R elbow on 04/24/2022  11:25 AM Indications: pain Details: 22 G 1.5 in needle, ultrasound-guided lateral approach Medications: 4 mL lidocaine 1 %; 80 mg triamcinolone acetonide 40 MG/ML Outcome: tolerated well, no immediate complications Consent was given by the patient. Immediately prior to procedure a time out was called to verify the correct patient, procedure, equipment, support staff and site/side marked as required. Patient was prepped and draped in the usual sterile fashion.       I personally saw and evaluated the patient, and participated in the management and treatment plan.  Vanetta Mulders, MD Attending Physician, Orthopedic Surgery  This document was dictated using Dragon voice recognition software. A reasonable attempt at proof reading has been made to minimize errors.

## 2022-05-17 ENCOUNTER — Encounter: Payer: 59 | Admitting: Family Medicine

## 2022-05-17 ENCOUNTER — Ambulatory Visit (INDEPENDENT_AMBULATORY_CARE_PROVIDER_SITE_OTHER): Payer: 59 | Admitting: Orthopaedic Surgery

## 2022-05-17 DIAGNOSIS — S46012A Strain of muscle(s) and tendon(s) of the rotator cuff of left shoulder, initial encounter: Secondary | ICD-10-CM | POA: Diagnosis not present

## 2022-05-17 NOTE — Progress Notes (Signed)
Chief Complaint: Bilateral shoulder pain     History of Present Illness:   05/17/2022: Presents today with left worse than right shoulder pain.  Overall the right shoulder did get much better after an injection.  He is here today as the left shoulder is hurting in the anterior aspect of the biceps and greater tuberosity.  He states that this is tolerable.   Micheal Mckinney "Micheal Mckinney" is a 51 y.o. male RHD presents with right shoulder pain for approximately 1 year after he was lifting a approximately 450 pound pool motor.  Since that time the shoulder has not felt right.  He has had significant difficulty laying directly on the side.  He endorses weakness and pain about the shoulder.  Describes the pain as sharp.  He has been trying to get more active and work out recently although his upper body workouts have been quite limited due to pain and weakness in the shoulder.  He has been taking ice and heat.  He works as a Chief Executive Officer.  He has previously done physical therapy for the neck as he has a previous history of cervical disc replacement.    Surgical History:   None  PMH/PSH/Family History/Social History/Meds/Allergies:    Past Medical History:  Diagnosis Date   Allergy    hay fever   Foreign body (FB) in soft tissue    right palm   GERD (gastroesophageal reflux disease)    History of chicken pox    Hyperlipidemia    Hypertension    OSA (obstructive sleep apnea) 01/26/2012   does not use CPAP   Past Surgical History:  Procedure Laterality Date   CARPAL TUNNEL RELEASE Bilateral 05/04/2013   CERVICAL FUSION     Alternative to this done in 05/2017, disc replacement   FOREIGN BODY REMOVAL Right 05/18/2018   Procedure: REMOVAL FOREIGN BODY RIGHT PALM;  Surgeon: Leanora Cover, MD;  Location: [facility];  Service: Orthopedics;  Laterality: Right;   [place] CYST  2009    on back and neck   Social  History   Socioeconomic History   Marital status: Married    Spouse name: Not on file   Number of children: Not on file   Years of education: Not on file   Highest education level: Not on file  Occupational History   Not on file  Tobacco Use   Smoking status: Former    Packs/day: 0.25    Years: 25.00    Total pack years: 6.25    Types: Cigarettes    Quit date: 01/16/2020    Years since quitting: 2.3   Smokeless tobacco: Never  Vaping Use   Vaping Use: Never used  Substance and Sexual Activity   Alcohol use: No    Alcohol/week: 0.0 standard drinks of alcohol   Drug use: No   Sexual activity: Yes  Other Topics Concern   Not on file  Social History Narrative   Lives with wife and dogs   Social Determinants of Health   Financial Resource Strain: Not on file  Food Insecurity: Not on file  Transportation Needs: Not on file  Physical Activity: Not on file  Stress: Not on file  Social Connections: Not on file   Family History  Problem Relation Age of Onset  Cancer Mother        breast   Stroke Father    No Known Allergies Current Outpatient Medications  Medication Sig Dispense Refill   aspirin 81 MG tablet Take 81 mg by mouth daily.     atenolol (TENORMIN) 50 MG tablet Take 1 tablet (50 mg total) by mouth daily. 90 tablet 3   cyclobenzaprine (FLEXERIL) 5 MG tablet Take 1 tablet (5 mg total) by mouth 3 (three) times daily as needed for muscle spasms (start qhs prn due to sedation). (Patient not taking: Reported on 03/21/2022) 15 tablet 1   ezetimibe (ZETIA) 10 MG tablet Take 1 tablet (10 mg total) by mouth daily. 90 tablet 3   methylPREDNISolone (MEDROL DOSEPAK) 4 MG TBPK tablet Take per packet instructions (Patient not taking: Reported on 03/21/2022) 21 tablet 0   ranitidine (ZANTAC) 150 MG tablet Take 1 tablet by mouth as needed.     rosuvastatin (CRESTOR) 5 MG tablet Take 1 tablet (5 mg total) by mouth daily. 90 tablet 3   telmisartan (MICARDIS) 40 MG tablet Take 1 tablet  (40 mg total) by mouth daily. 90 tablet 3   No current facility-administered medications for this visit.   No results found.  Review of Systems:   A ROS was performed including pertinent positives and negatives as documented in the HPI.  Physical Exam :   Constitutional: NAD and appears stated age Neurological: Alert and oriented Psych: Appropriate affect and cooperative There were no vitals taken for this visit.   Comprehensive Musculoskeletal Exam:    Musculoskeletal Exam    Inspection Right Left  Skin No atrophy or winging No atrophy or winging  Palpation    Tenderness Lateral deltoid Lateral deltoid, biceps  Range of Motion    Flexion (passive) 150 170  Flexion (active) 120 170  Abduction 100 170  ER at the side 30 70  Can reach behind back to T12 T12  Strength     5 supra infraspinatus None  Special Tests    Pseudoparalytic No No  Neurologic    Fires PIN, radial, median, ulnar, musculocutaneous, axillary, suprascapular, long thoracic, and spinal accessory innervated muscles. No abnormal sensibility  Vascular/Lymphatic    Radial Pulse 2+ 2+  Cervical Exam    Patient has symmetric cervical range of motion with negative Spurling's test.  Special Test: Positive Neer impingement bilaterally  Tenderness to palpation over medial epicondyle of right elbow as well as first dorsal compartment with positive Finkelstein bilaterally   Imaging:   Xray (right elbow 3 views, bilateral wrist 3 views): There is early osteoarthritic findings of the right elbow  MRI right shoulder: There is tendinosis involving the supra as well as infraspinatus.   I personally reviewed and interpreted the radiographs.   Assessment:   51 year old male with left shoulder pain consistent with biceps tendinitis and inflammation of the rotator cuff.  At this time I recommended a posterior shoulder strengthening program including rows.  We will plan to perform an injection as needed.  I have  specifically provided him with bands that he can obtain as well as a shoulder strengthening program Plan :    -Return to clinic as needed   I personally saw and evaluated the patient, and participated in the management and treatment plan.  Huel Cote, MD Attending Physician, Orthopedic Surgery  This document was dictated using Dragon voice recognition software. A reasonable attempt at proof reading has been made to minimize errors.

## 2022-05-31 ENCOUNTER — Ambulatory Visit (INDEPENDENT_AMBULATORY_CARE_PROVIDER_SITE_OTHER): Payer: 59 | Admitting: Orthopaedic Surgery

## 2022-05-31 DIAGNOSIS — M7521 Bicipital tendinitis, right shoulder: Secondary | ICD-10-CM | POA: Diagnosis not present

## 2022-05-31 DIAGNOSIS — M25512 Pain in left shoulder: Secondary | ICD-10-CM | POA: Diagnosis not present

## 2022-05-31 DIAGNOSIS — M25511 Pain in right shoulder: Secondary | ICD-10-CM | POA: Diagnosis not present

## 2022-05-31 DIAGNOSIS — S46012A Strain of muscle(s) and tendon(s) of the rotator cuff of left shoulder, initial encounter: Secondary | ICD-10-CM

## 2022-05-31 MED ORDER — TRIAMCINOLONE ACETONIDE 40 MG/ML IJ SUSP
80.0000 mg | INTRAMUSCULAR | Status: AC | PRN
  Administered 2022-05-31: 80 mg via INTRA_ARTICULAR

## 2022-05-31 MED ORDER — LIDOCAINE HCL 1 % IJ SOLN
4.0000 mL | INTRAMUSCULAR | Status: AC | PRN
  Administered 2022-05-31: 4 mL

## 2022-05-31 NOTE — Progress Notes (Addendum)
Chief Complaint: Bilateral shoulder pain     History of Present Illness:   05/17/2022: Presents today requesting an injection for both shoulders as he is continuing to have pain and weakness with overhead activity.  I did specifically provide him a strengthening program which she has not done for a month.  He has been using bands with no improvement.  His left shoulder is quite significantly bothering him today   BRITON Mckinney "Micheal Mckinney" is a 51 y.o. male RHD presents with right shoulder pain for approximately 1 year after he was lifting a approximately 450 pound pool motor.  Since that time the shoulder has not felt right.  He has had significant difficulty laying directly on the side.  He endorses weakness and pain about the shoulder.  Describes the pain as sharp.  He has been trying to get more active and work out recently although his upper body workouts have been quite limited due to pain and weakness in the shoulder.  He has been taking ice and heat.  He works as a Haematologist.  He has previously done physical therapy for the neck as he has a previous history of cervical disc replacement.    Surgical History:   None  PMH/PSH/Family History/Social History/Meds/Allergies:    Past Medical History:  Diagnosis Date  . Allergy    hay fever  . Foreign body (FB) in soft tissue    right palm  . GERD (gastroesophageal reflux disease)   . History of chicken pox   . Hyperlipidemia   . Hypertension   . OSA (obstructive sleep apnea) 01/26/2012   does not use CPAP   Past Surgical History:  Procedure Laterality Date  . CARPAL TUNNEL RELEASE Bilateral 05/04/2013  . CERVICAL FUSION     Alternative to this done in 05/2017, disc replacement  . FOREIGN BODY REMOVAL Right 05/18/2018   Procedure: REMOVAL FOREIGN BODY RIGHT PALM;  Surgeon: Betha Loa, MD;  [facility]:  [facility];  Service: Orthopedics;  Laterality: Right;  . IRRIGATION  AND DEBRIDEMENT SEBACEOUS CYST  2009    on back and neck   Social History   Socioeconomic History  . Marital status: Married    Spouse name: Not on file  . Number of children: Not on file  . Years of education: Not on file  . Highest education level: Not on file  Occupational History  . Not on file  Tobacco Use  . Smoking status: Former    Packs/day: 0.25    Years: 25.00    Total pack years: 6.25    Types: Cigarettes    Quit date: 01/16/2020    Years since quitting: 2.3  . Smokeless tobacco: Never  Vaping Use  . Vaping Use: Never used  Substance and Sexual Activity  . Alcohol use: No    Alcohol/week: 0.0 standard drinks of alcohol  . Drug use: No  . Sexual activity: Yes  Other Topics Concern  . Not on file  Social History Narrative   Lives with wife and dogs   Social Determinants of Health   Financial Resource Strain: Not on file  Food Insecurity: Not on file  Transportation Needs: Not on file  Physical Activity: Not on file  Stress: Not on file  Social Connections: Not on file   Family History  Problem Relation Age of Onset  . Cancer Mother        breast  . Stroke Father    No Known Allergies Current Outpatient Medications  Medication Sig Dispense Refill  . aspirin 81 MG tablet Take 81 mg by mouth daily.    Marland Kitchen atenolol (TENORMIN) 50 MG tablet Take 1 tablet (50 mg total) by mouth daily. 90 tablet 3  . cyclobenzaprine (FLEXERIL) 5 MG tablet Take 1 tablet (5 mg total) by mouth 3 (three) times daily as needed for muscle spasms (start qhs prn due to sedation). (Patient not taking: Reported on 03/21/2022) 15 tablet 1  . ezetimibe (ZETIA) 10 MG tablet Take 1 tablet (10 mg total) by mouth daily. 90 tablet 3  . methylPREDNISolone (MEDROL DOSEPAK) 4 MG TBPK tablet Take per packet instructions (Patient not taking: Reported on 03/21/2022) 21 tablet 0  . ranitidine (ZANTAC) 150 MG tablet Take 1 tablet by mouth as needed.    . rosuvastatin (CRESTOR) 5 MG tablet Take 1 tablet (5  mg total) by mouth daily. 90 tablet 3  . telmisartan (MICARDIS) 40 MG tablet Take 1 tablet (40 mg total) by mouth daily. 90 tablet 3   No current facility-administered medications for this visit.   No results found.  Review of Systems:   A ROS was performed including pertinent positives and negatives as documented in the HPI.  Physical Exam :   Constitutional: NAD and appears stated age Neurological: Alert and oriented Psych: Appropriate affect and cooperative There were no vitals taken for this visit.   Comprehensive Musculoskeletal Exam:    Musculoskeletal Exam    Inspection Right Left  Skin No atrophy or winging No atrophy or winging  Palpation    Tenderness Lateral deltoid Lateral deltoid, biceps  Range of Motion    Flexion (passive) 150 170  Flexion (active) 120 170  Abduction 100 170  ER at the side 30 70  Can reach behind back to T12 T12  Strength     5 supra infraspinatus None  Special Tests    Pseudoparalytic No No  Neurologic    Fires PIN, radial, median, ulnar, musculocutaneous, axillary, suprascapular, long thoracic, and spinal accessory innervated muscles. No abnormal sensibility  Vascular/Lymphatic    Radial Pulse 2+ 2+  Cervical Exam    Patient has symmetric cervical range of motion with negative Spurling's test.  Special Test: Positive Neer impingement bilaterally  Tenderness to palpation over medial epicondyle of right elbow as well as first dorsal compartment with positive Finkelstein bilaterally   Imaging:   Xray (right elbow 3 views, bilateral wrist 3 views): There is early osteoarthritic findings of the right elbow  MRI right shoulder: There is tendinosis involving the supra as well as infraspinatus.   I personally reviewed and interpreted the radiographs.   Assessment:   51 year old male with left shoulder pain consistent with biceps tendinitis and inflammation of the rotator cuff.  At this time I would recommend ultrasound-guided injections  of both shoulders although I do believe an MRI would be helpful to ascertain his underlying source of pain to the left shoulder.  At this time we will plan to proceed with this.  I will see him back following discuss results. Plan :    -Plan for left shoulder MRI follow-up discuss results    Procedure Note  Patient: BETHEL GAGLIO             Date of Birth: [birth year]  MRN: [id number]             Visit Date: 05/31/2022  Procedures: Visit Diagnoses:  1. Strain of musc/tend the rotator cuff of left shoulder, init     Large Joint Inj: R subacromial bursa on 05/31/2022 12:12 PM Indications: pain Details: 22 G 1.5 in needle, ultrasound-guided anterior approach  Arthrogram: No  Medications: 4 mL lidocaine 1 %; 80 mg triamcinolone acetonide 40 MG/ML Outcome: tolerated well, no immediate complications Procedure, treatment alternatives, risks and benefits explained, specific risks discussed. Consent was given by the patient. Immediately prior to procedure a time out was called to verify the correct patient, procedure, equipment, support staff and site/side marked as required. Patient was prepped and draped in the usual sterile fashion.    Large Joint Inj: L subacromial bursa on 05/31/2022 12:12 PM Indications: pain Details: 22 G 1.5 in needle, ultrasound-guided anterior approach  Arthrogram: No  Medications: 4 mL lidocaine 1 %; 80 mg triamcinolone acetonide 40 MG/ML Outcome: tolerated well, no immediate complications Procedure, treatment alternatives, risks and benefits explained, specific risks discussed. Consent was given by the patient. Immediately prior to procedure a time out was called to verify the correct patient, procedure, equipment, support staff and site/side marked as required. Patient was prepped and draped in the usual sterile fashion.       I personally saw and evaluated the patient, and participated in the management and treatment plan.  Huel Cote, MD Attending Physician, Orthopedic Surgery  This document was dictated using Dragon voice recognition software. A reasonable attempt at proof reading has been made to minimize errors.

## 2022-06-19 ENCOUNTER — Encounter: Payer: 59 | Admitting: Family Medicine

## 2022-06-21 ENCOUNTER — Ambulatory Visit (HOSPITAL_BASED_OUTPATIENT_CLINIC_OR_DEPARTMENT_OTHER)
Admission: RE | Admit: 2022-06-21 | Discharge: 2022-06-21 | Disposition: A | Discharge status: Home / Self Care | Payer: 59 | Source: Ambulatory Visit | Attending: Orthopaedic Surgery | Admitting: Orthopaedic Surgery

## 2022-06-21 DIAGNOSIS — M67814 Other specified disorders of tendon, left shoulder: Secondary | ICD-10-CM | POA: Diagnosis not present

## 2022-06-21 DIAGNOSIS — M75112 Incomplete rotator cuff tear or rupture of left shoulder, not specified as traumatic: Secondary | ICD-10-CM | POA: Insufficient documentation

## 2022-06-21 DIAGNOSIS — M19012 Primary osteoarthritis, left shoulder: Secondary | ICD-10-CM | POA: Diagnosis not present

## 2022-06-21 DIAGNOSIS — S46012A Strain of muscle(s) and tendon(s) of the rotator cuff of left shoulder, initial encounter: Secondary | ICD-10-CM

## 2022-06-21 DIAGNOSIS — G8929 Other chronic pain: Secondary | ICD-10-CM | POA: Diagnosis present

## 2022-06-28 ENCOUNTER — Other Ambulatory Visit (HOSPITAL_BASED_OUTPATIENT_CLINIC_OR_DEPARTMENT_OTHER): Payer: Self-pay

## 2022-06-28 ENCOUNTER — Ambulatory Visit (INDEPENDENT_AMBULATORY_CARE_PROVIDER_SITE_OTHER): Payer: 59 | Admitting: Orthopaedic Surgery

## 2022-06-28 DIAGNOSIS — M75122 Complete rotator cuff tear or rupture of left shoulder, not specified as traumatic: Secondary | ICD-10-CM

## 2022-06-28 MED ORDER — OXYCODONE HCL 5 MG PO TABS
5.0000 mg | ORAL_TABLET | ORAL | 0 refills | Status: DC | PRN
  Filled 2022-06-28: qty 10, 2d supply, fill #0

## 2022-06-28 MED ORDER — IBUPROFEN 800 MG PO TABS
800.0000 mg | ORAL_TABLET | Freq: Three times a day (TID) | ORAL | 0 refills | Status: AC
  Filled 2022-06-28: qty 30, 10d supply, fill #0

## 2022-06-28 MED ORDER — ASPIRIN 325 MG PO TBEC
325.0000 mg | DELAYED_RELEASE_TABLET | Freq: Every day | ORAL | 0 refills | Status: AC
  Filled 2022-06-28: qty 30, 30d supply, fill #0

## 2022-06-28 MED ORDER — ACETAMINOPHEN 500 MG PO TABS
500.0000 mg | ORAL_TABLET | Freq: Three times a day (TID) | ORAL | 0 refills | Status: AC
  Filled 2022-06-28: qty 30, 10d supply, fill #0

## 2022-06-28 NOTE — H&P (View-Only) (Signed)
                               Chief Complaint: Bilateral shoulder pain     History of Present Illness:   06/28/2022: Presents today for follow-up of his left shoulder MRI.  He is still having persistent pain over the anterior lateral aspect of the shoulder.  He is having difficulty sleeping at night laying directly on the side.  This is remained symptomatic and is bothering him particularly with head activity.   Micheal W Heagle "Charlie" is a 51 y.o. male RHD presents with right shoulder pain for approximately 1 year after he was lifting a approximately 450 pound pool motor.  Since that time the shoulder has not felt right.  He has had significant difficulty laying directly on the side.  He endorses weakness and pain about the shoulder.  Describes the pain as sharp.  He has been trying to get more active and work out recently although his upper body workouts have been quite limited due to pain and weakness in the shoulder.  He has been taking ice and heat.  He works as a commercial plumber.  He has previously done physical therapy for the neck as he has a previous history of cervical disc replacement.    Surgical History:   None  PMH/PSH/Family History/Social History/Meds/Allergies:    Past Medical History:  Diagnosis Date   Allergy    hay fever   Foreign body (FB) in soft tissue    right palm   GERD (gastroesophageal reflux disease)    History of chicken pox    Hyperlipidemia    Hypertension    OSA (obstructive sleep apnea) 01/26/2012   does not use CPAP   Past Surgical History:  Procedure Laterality Date   CARPAL TUNNEL RELEASE Bilateral 05/04/2013   CERVICAL FUSION     Alternative to this done in 05/2017, disc replacement   FOREIGN BODY REMOVAL Right 05/18/2018   Procedure: REMOVAL FOREIGN BODY RIGHT PALM;  Surgeon: Kuzma, Kevin, MD;  Location: [facility];  Service: Orthopedics;  Laterality: Right;   IRRIGATION AND DEBRIDEMENT SEBACEOUS CYST  2009     on back and neck   Social History   Socioeconomic History   Marital status: Married    Spouse name: Not on file   Number of children: Not on file   Years of education: Not on file   Highest education level: Not on file  Occupational History   Not on file  Tobacco Use   Smoking status: Former    Packs/day: 0.25    Years: 25.00    Total pack years: 6.25    Types: Cigarettes    Quit date: 01/16/2020    Years since quitting: 2.4   Smokeless tobacco: Never  Vaping Use   Vaping Use: Never used  Substance and Sexual Activity   Alcohol use: No    Alcohol/week: 0.0 standard drinks of alcohol   Drug use: No   Sexual activity: Yes  Other Topics Concern   Not on file  Social History Narrative   Lives with wife and dogs   Social Determinants of Health   Financial Resource Strain: Not on file  Food Insecurity: Not on file  Transportation Needs: Not on file  Physical Activity: Not on file  Stress: Not on file  Social Connections: Not on file   Family History  Problem Relation Age of Onset     Cancer Mother        breast   Stroke Father    No Known Allergies Current Outpatient Medications  Medication Sig Dispense Refill   acetaminophen (TYLENOL) 500 MG tablet Take 1 tablet (500 mg total) by mouth every 8 (eight) hours for 10 days. 30 tablet 0   aspirin EC 325 MG tablet Take 1 tablet (325 mg total) by mouth daily. 30 tablet 0   ibuprofen (ADVIL) 800 MG tablet Take 1 tablet (800 mg total) by mouth every 8 (eight) hours for 10 days. Please take with food, please alternate with acetaminophen 30 tablet 0   oxyCODONE (OXY IR/ROXICODONE) 5 MG immediate release tablet Take 1 tablet (5 mg total) by mouth every 4 (four) hours as needed (severe pain). 10 tablet 0   aspirin 81 MG tablet Take 81 mg by mouth daily.     atenolol (TENORMIN) 50 MG tablet Take 1 tablet (50 mg total) by mouth daily. 90 tablet 3   cyclobenzaprine (FLEXERIL) 5 MG tablet Take 1 tablet (5 mg total) by mouth 3 (three)  times daily as needed for muscle spasms (start qhs prn due to sedation). (Patient not taking: Reported on 03/21/2022) 15 tablet 1   ezetimibe (ZETIA) 10 MG tablet Take 1 tablet (10 mg total) by mouth daily. 90 tablet 3   methylPREDNISolone (MEDROL DOSEPAK) 4 MG TBPK tablet Take per packet instructions (Patient not taking: Reported on 03/21/2022) 21 tablet 0   ranitidine (ZANTAC) 150 MG tablet Take 1 tablet by mouth as needed.     rosuvastatin (CRESTOR) 5 MG tablet Take 1 tablet (5 mg total) by mouth daily. 90 tablet 3   telmisartan (MICARDIS) 40 MG tablet Take 1 tablet (40 mg total) by mouth daily. 90 tablet 3   No current facility-administered medications for this visit.   No results found.  Review of Systems:   A ROS was performed including pertinent positives and negatives as documented in the HPI.  Physical Exam :   Constitutional: NAD and appears stated age Neurological: Alert and oriented Psych: Appropriate affect and cooperative There were no vitals taken for this visit.   Comprehensive Musculoskeletal Exam:    Musculoskeletal Exam    Inspection Right Left  Skin No atrophy or winging No atrophy or winging  Palpation    Tenderness Lateral deltoid Lateral deltoid, biceps, subscapularis, lesser tuberosity  Range of Motion    Flexion (passive) 150 170  Flexion (active) 120 170  Abduction 100 170  ER at the side 30 70  Can reach behind back to T12 T12  Strength     5 supra infraspinatus None  Special Tests    Pseudoparalytic No No  Neurologic    Fires PIN, radial, median, ulnar, musculocutaneous, axillary, suprascapular, long thoracic, and spinal accessory innervated muscles. No abnormal sensibility  Vascular/Lymphatic    Radial Pulse 2+ 2+  Cervical Exam    Patient has symmetric cervical range of motion with negative Spurling's test.  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   MRI left shoulder: There is tendinosis involving the supra as well as infraspinatus.  There is a complete cephalad  border tear of the subscapularis with biceps tearing and medial subluxation  I personally reviewed and interpreted the radiographs.   Assessment:   51-year-old male with left shoulder subscapularis tear as well as biceps tendinitis.  At this time he is trialed multiple months of the band strengthening program as well as multiple injections into both shoulders.  He is not getting any persistent relief from his last injection in the left shoulder.  That effect I do believe that he would potentially be a candidate for arthroscopy with subscapularis repair and biceps tenodesis.  We discussed the limitations and restrictions associated with this.  We discussed the complications and possible outcomes associated with this.  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  He understands all of this.  He would like to proceed with left shoulder arthroscopy. Plan :    -Plan for left shoulder arthroscopy with subscapularis repair and biceps tenodesis   After a lengthy discussion of treatment options, including risks, benefits, alternatives, complications of surgical and nonsurgical conservative options, the patient elected surgical repair.   The patient  is aware of the material risks  and complications including, but not limited to injury to adjacent structures, neurovascular injury, infection, numbness, bleeding, implant failure, thermal burns, stiffness, persistent pain, failure to heal, disease transmission from allograft, need for  further surgery, dislocation, anesthetic risks, blood clots, risks of death,and others. The probabilities of surgical success and failure discussed with patient given their particular co-morbidities.The time and nature of expected rehabilitation and recovery was discussed.The patient's questions were all answered preoperatively.  No barriers to understanding were noted. I explained the natural history of the disease process and Rx rationale.  I explained to the patient what I considered to be reasonable expectations given their personal situation.  The final treatment plan was arrived at through a shared patient decision making process model.   I personally saw and evaluated the patient, and participated in the management and treatment plan.  Tianna Baus, MD Attending Physician, Orthopedic Surgery  This document was dictated using Dragon voice recognition software. A reasonable attempt at proof reading has been made to minimize errors. 

## 2022-06-28 NOTE — Progress Notes (Signed)
Chief Complaint: Bilateral shoulder pain     History of Present Illness:   06/28/2022: Presents today for follow-up of his left shoulder MRI.  He is still having persistent pain over the anterior lateral aspect of the shoulder.  He is having difficulty sleeping at night laying directly on the side.  This is remained symptomatic and is bothering him particularly with head activity.   Micheal Lowenstein Sensabaugh "[name]" is a 51 y.o. male RHD presents with right shoulder pain for approximately 1 year after he was lifting a approximately 450 pound pool motor.  Since that time the shoulder has not felt right.  He has had significant difficulty laying directly on the side.  He endorses weakness and pain about the shoulder.  Describes the pain as sharp.  He has been trying to get more active and work out recently although his upper body workouts have been quite limited due to pain and weakness in the shoulder.  He has been taking ice and heat.  He works as a Chief Executive Officer.  He has previously done physical therapy for the neck as he has a previous history of cervical disc replacement.    Surgical History:   None  PMH/PSH/Family History/Social History/Meds/Allergies:    Past Medical History:  Diagnosis Date   Allergy    hay fever   Foreign body (FB) in soft tissue    right palm   GERD (gastroesophageal reflux disease)    History of chicken pox    Hyperlipidemia    Hypertension    OSA (obstructive sleep apnea) 01/26/2012   does not use CPAP   Past Surgical History:  Procedure Laterality Date   CARPAL TUNNEL RELEASE Bilateral 05/04/2013   CERVICAL FUSION     Alternative to this done in 05/2017, disc replacement   FOREIGN BODY REMOVAL Right 05/18/2018   Procedure: REMOVAL FOREIGN BODY RIGHT PALM;  Surgeon: Leanora Cover, MD;  Location: [facility];  Service: Orthopedics;  Laterality: Right;   [place] CYST  2009     on back and neck   Social History   Socioeconomic History   Marital status: Married    Spouse name: Not on file   Number of children: Not on file   Years of education: Not on file   Highest education level: Not on file  Occupational History   Not on file  Tobacco Use   Smoking status: Former    Packs/day: 0.25    Years: 25.00    Total pack years: 6.25    Types: Cigarettes    Quit date: 01/16/2020    Years since quitting: 2.4   Smokeless tobacco: Never  Vaping Use   Vaping Use: Never used  Substance and Sexual Activity   Alcohol use: No    Alcohol/week: 0.0 standard drinks of alcohol   Drug use: No   Sexual activity: Yes  Other Topics Concern   Not on file  Social History Narrative   Lives with wife and dogs   Social Determinants of Health   Financial Resource Strain: Not on file  Food Insecurity: Not on file  Transportation Needs: Not on file  Physical Activity: Not on file  Stress: Not on file  Social Connections: Not on file   Family History  Problem Relation Age of Onset  Cancer Mother        breast   Stroke Father    No Known Allergies Current Outpatient Medications  Medication Sig Dispense Refill   acetaminophen (TYLENOL) 500 MG tablet Take 1 tablet (500 mg total) by mouth every 8 (eight) hours for 10 days. 30 tablet 0   aspirin EC 325 MG tablet Take 1 tablet (325 mg total) by mouth daily. 30 tablet 0   ibuprofen (ADVIL) 800 MG tablet Take 1 tablet (800 mg total) by mouth every 8 (eight) hours for 10 days. Please take with food, please alternate with acetaminophen 30 tablet 0   oxyCODONE (OXY IR/ROXICODONE) 5 MG immediate release tablet Take 1 tablet (5 mg total) by mouth every 4 (four) hours as needed (severe pain). 10 tablet 0   aspirin 81 MG tablet Take 81 mg by mouth daily.     atenolol (TENORMIN) 50 MG tablet Take 1 tablet (50 mg total) by mouth daily. 90 tablet 3   cyclobenzaprine (FLEXERIL) 5 MG tablet Take 1 tablet (5 mg total) by mouth 3 (three)  times daily as needed for muscle spasms (start qhs prn due to sedation). (Patient not taking: Reported on 03/21/2022) 15 tablet 1   ezetimibe (ZETIA) 10 MG tablet Take 1 tablet (10 mg total) by mouth daily. 90 tablet 3   methylPREDNISolone (MEDROL DOSEPAK) 4 MG TBPK tablet Take per packet instructions (Patient not taking: Reported on 03/21/2022) 21 tablet 0   ranitidine (ZANTAC) 150 MG tablet Take 1 tablet by mouth as needed.     rosuvastatin (CRESTOR) 5 MG tablet Take 1 tablet (5 mg total) by mouth daily. 90 tablet 3   telmisartan (MICARDIS) 40 MG tablet Take 1 tablet (40 mg total) by mouth daily. 90 tablet 3   No current facility-administered medications for this visit.   No results found.  Review of Systems:   A ROS was performed including pertinent positives and negatives as documented in the HPI.  Physical Exam :   Constitutional: NAD and appears stated age Neurological: Alert and oriented Psych: Appropriate affect and cooperative There were no vitals taken for this visit.   Comprehensive Musculoskeletal Exam:    Musculoskeletal Exam    Inspection Right Left  Skin No atrophy or winging No atrophy or winging  Palpation    Tenderness Lateral deltoid Lateral deltoid, biceps, subscapularis, lesser tuberosity  Range of Motion    Flexion (passive) 150 170  Flexion (active) 120 170  Abduction 100 170  ER at the side 30 70  Can reach behind back to T12 T12  Strength     5 supra infraspinatus None  Special Tests    Pseudoparalytic No No  Neurologic    Fires PIN, radial, median, ulnar, musculocutaneous, axillary, suprascapular, long thoracic, and spinal accessory innervated muscles. No abnormal sensibility  Vascular/Lymphatic    Radial Pulse 2+ 2+  Cervical Exam    Patient has symmetric cervical range of motion with negative Spurling's test.  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   MRI left shoulder: There is tendinosis involving the supra as well as infraspinatus.  There is a complete cephalad  border tear of the subscapularis with biceps tearing and medial subluxation  I personally reviewed and interpreted the radiographs.   Assessment:   51 year old male with left shoulder subscapularis tear as well as biceps tendinitis.  At this time he is trialed multiple months of the band strengthening program as well as multiple injections into both shoulders.  He is not getting any persistent relief from his last injection in the left shoulder.  That effect I do believe that he would potentially be a candidate for arthroscopy with subscapularis repair and biceps tenodesis.  We discussed the limitations and restrictions associated with this.  We discussed the complications and possible outcomes associated with this.  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  He understands all of this.  He would like to proceed with left shoulder arthroscopy. Plan :    -Plan for left shoulder arthroscopy with subscapularis repair and biceps tenodesis   After a lengthy discussion of treatment options, including risks, benefits, alternatives, complications of surgical and nonsurgical conservative options, the patient elected surgical repair.   The patient  is aware of the material risks  and complications including, but not limited to injury to adjacent structures, neurovascular injury, infection, numbness, bleeding, implant failure, thermal burns, stiffness, persistent pain, failure to heal, disease transmission from allograft, need for  further surgery, dislocation, anesthetic risks, blood clots, risks of death,and others. The probabilities of surgical success and failure discussed with patient given their particular co-morbidities.The time and nature of expected rehabilitation and recovery was discussed.The patient's questions were all answered preoperatively.  No barriers to understanding were noted. I explained the natural history of the disease process and Rx rationale.  I explained to the patient what I considered to be reasonable expectations given their personal situation.  The final treatment plan was arrived at through a shared patient decision making process model.   I personally saw and evaluated the patient, and participated in the management and treatment plan.  Vanetta Mulders, MD Attending Physician, Orthopedic Surgery  This document was dictated using Dragon voice recognition software. A reasonable attempt at proof reading has been made to minimize errors.

## 2022-07-01 ENCOUNTER — Telehealth: Payer: Self-pay | Admitting: Orthopaedic Surgery

## 2022-07-01 NOTE — Telephone Encounter (Signed)
Patient needs to find out about his surgery and wants to know when it will be, this year or next year. Please advise [phone]

## 2022-07-02 NOTE — Telephone Encounter (Signed)
I spoke with patient, and wife in reference to scheduling surgery. Patient has been scheduled for 07/23/22. All questions were answered. 07/01/22.

## 2022-07-05 ENCOUNTER — Ambulatory Visit: Payer: 59 | Admitting: Family Medicine

## 2022-07-16 ENCOUNTER — Encounter (HOSPITAL_BASED_OUTPATIENT_CLINIC_OR_DEPARTMENT_OTHER): Payer: Self-pay | Admitting: Orthopaedic Surgery

## 2022-07-16 ENCOUNTER — Other Ambulatory Visit: Payer: Self-pay

## 2022-07-18 ENCOUNTER — Ambulatory Visit (HOSPITAL_BASED_OUTPATIENT_CLINIC_OR_DEPARTMENT_OTHER): Payer: Self-pay | Admitting: Orthopaedic Surgery

## 2022-07-18 DIAGNOSIS — M75122 Complete rotator cuff tear or rupture of left shoulder, not specified as traumatic: Secondary | ICD-10-CM

## 2022-07-22 ENCOUNTER — Encounter (HOSPITAL_BASED_OUTPATIENT_CLINIC_OR_DEPARTMENT_OTHER)
Admission: RE | Admit: 2022-07-22 | Discharge: 2022-07-22 | Disposition: A | Discharge status: Home / Self Care | Payer: 59 | Source: Ambulatory Visit | Attending: Orthopaedic Surgery | Admitting: Orthopaedic Surgery

## 2022-07-22 DIAGNOSIS — Z01810 Encounter for preprocedural cardiovascular examination: Secondary | ICD-10-CM | POA: Insufficient documentation

## 2022-07-22 NOTE — Progress Notes (Signed)

## 2022-07-23 ENCOUNTER — Other Ambulatory Visit: Payer: Self-pay

## 2022-07-23 ENCOUNTER — Ambulatory Visit (HOSPITAL_BASED_OUTPATIENT_CLINIC_OR_DEPARTMENT_OTHER)
Admission: RE | Admit: 2022-07-23 | Discharge: 2022-07-23 | Disposition: A | Discharge status: Home / Self Care | Payer: 59 | Attending: Orthopaedic Surgery | Admitting: Orthopaedic Surgery

## 2022-07-23 ENCOUNTER — Ambulatory Visit (HOSPITAL_BASED_OUTPATIENT_CLINIC_OR_DEPARTMENT_OTHER): Payer: 59 | Admitting: Anesthesiology

## 2022-07-23 ENCOUNTER — Encounter (HOSPITAL_BASED_OUTPATIENT_CLINIC_OR_DEPARTMENT_OTHER): Payer: Self-pay | Admitting: Orthopaedic Surgery

## 2022-07-23 ENCOUNTER — Encounter (HOSPITAL_BASED_OUTPATIENT_CLINIC_OR_DEPARTMENT_OTHER)
Admission: RE | Disposition: A | Discharge status: Home / Self Care | Payer: Self-pay | Source: Home / Self Care | Attending: Orthopaedic Surgery

## 2022-07-23 DIAGNOSIS — Z01818 Encounter for other preprocedural examination: Secondary | ICD-10-CM

## 2022-07-23 DIAGNOSIS — M75122 Complete rotator cuff tear or rupture of left shoulder, not specified as traumatic: Secondary | ICD-10-CM

## 2022-07-23 DIAGNOSIS — M7522 Bicipital tendinitis, left shoulder: Secondary | ICD-10-CM

## 2022-07-23 DIAGNOSIS — K219 Gastro-esophageal reflux disease without esophagitis: Secondary | ICD-10-CM | POA: Diagnosis not present

## 2022-07-23 DIAGNOSIS — Z87891 Personal history of nicotine dependence: Secondary | ICD-10-CM

## 2022-07-23 DIAGNOSIS — M75102 Unspecified rotator cuff tear or rupture of left shoulder, not specified as traumatic: Secondary | ICD-10-CM | POA: Insufficient documentation

## 2022-07-23 DIAGNOSIS — G4730 Sleep apnea, unspecified: Secondary | ICD-10-CM

## 2022-07-23 DIAGNOSIS — Z6841 Body Mass Index (BMI) 40.0 and over, adult: Secondary | ICD-10-CM | POA: Insufficient documentation

## 2022-07-23 DIAGNOSIS — Z79899 Other long term (current) drug therapy: Secondary | ICD-10-CM | POA: Insufficient documentation

## 2022-07-23 DIAGNOSIS — I10 Essential (primary) hypertension: Secondary | ICD-10-CM | POA: Diagnosis not present

## 2022-07-23 DIAGNOSIS — G4733 Obstructive sleep apnea (adult) (pediatric): Secondary | ICD-10-CM | POA: Insufficient documentation

## 2022-07-23 DIAGNOSIS — E785 Hyperlipidemia, unspecified: Secondary | ICD-10-CM | POA: Insufficient documentation

## 2022-07-23 HISTORY — PX: SHOULDER ARTHROSCOPY WITH ROTATOR CUFF REPAIR AND OPEN BICEPS TENODESIS: SHX6677

## 2022-07-23 SURGERY — SHOULDER ARTHROSCOPY WITH ROTATOR CUFF REPAIR AND OPEN BICEPS TENODESIS
Anesthesia: General | Site: Shoulder | Laterality: Left

## 2022-07-23 MED ORDER — FENTANYL CITRATE (PF) 100 MCG/2ML IJ SOLN
100.0000 ug | Freq: Once | INTRAMUSCULAR | Status: AC
  Administered 2022-07-23: 100 ug via INTRAVENOUS

## 2022-07-23 MED ORDER — TRANEXAMIC ACID-NACL 1000-0.7 MG/100ML-% IV SOLN
INTRAVENOUS | Status: AC
  Filled 2022-07-23: qty 100

## 2022-07-23 MED ORDER — SODIUM CHLORIDE 0.9 % IR SOLN: Status: DC | PRN

## 2022-07-23 MED ORDER — ROCURONIUM BROMIDE 10 MG/ML (PF) SYRINGE
PREFILLED_SYRINGE | INTRAVENOUS | Status: AC
  Filled 2022-07-23: qty 10

## 2022-07-23 MED ORDER — LIDOCAINE 2% (20 MG/ML) 5 ML SYRINGE
INTRAMUSCULAR | Status: AC
  Filled 2022-07-23: qty 5

## 2022-07-23 MED ORDER — CEFAZOLIN SODIUM-DEXTROSE 2-4 GM/100ML-% IV SOLN
INTRAVENOUS | Status: AC
  Filled 2022-07-23: qty 100

## 2022-07-23 MED ORDER — EPHEDRINE SULFATE (PRESSORS) 50 MG/ML IJ SOLN
INTRAMUSCULAR | Status: DC | PRN
  Administered 2022-07-23 (×2): 5 mg via INTRAVENOUS

## 2022-07-23 MED ORDER — BUPIVACAINE LIPOSOME 1.3 % IJ SUSP
INTRAMUSCULAR | Status: DC | PRN
  Administered 2022-07-23: 10 mL via PERINEURAL

## 2022-07-23 MED ORDER — BUPIVACAINE HCL (PF) 0.5 % IJ SOLN
INTRAMUSCULAR | Status: DC | PRN
  Administered 2022-07-23: 20 mL via PERINEURAL

## 2022-07-23 MED ORDER — GABAPENTIN 300 MG PO CAPS
300.0000 mg | ORAL_CAPSULE | Freq: Once | ORAL | Status: AC
  Administered 2022-07-23: 300 mg via ORAL

## 2022-07-23 MED ORDER — ONDANSETRON HCL 4 MG/2ML IJ SOLN
INTRAMUSCULAR | Status: DC | PRN
  Administered 2022-07-23: 4 mg via INTRAVENOUS

## 2022-07-23 MED ORDER — PROPOFOL 500 MG/50ML IV EMUL
INTRAVENOUS | Status: AC
  Filled 2022-07-23: qty 50

## 2022-07-23 MED ORDER — DEXAMETHASONE SODIUM PHOSPHATE 4 MG/ML IJ SOLN
INTRAMUSCULAR | Status: DC | PRN
  Administered 2022-07-23: 10 mg via INTRAVENOUS

## 2022-07-23 MED ORDER — CELECOXIB 200 MG PO CAPS
ORAL_CAPSULE | ORAL | Status: AC
  Filled 2022-07-23: qty 1

## 2022-07-23 MED ORDER — FENTANYL CITRATE (PF) 100 MCG/2ML IJ SOLN
INTRAMUSCULAR | Status: AC
  Filled 2022-07-23: qty 2

## 2022-07-23 MED ORDER — PHENYLEPHRINE HCL (PRESSORS) 10 MG/ML IV SOLN
INTRAVENOUS | Status: AC
  Filled 2022-07-23: qty 1

## 2022-07-23 MED ORDER — PHENYLEPHRINE HCL-NACL 20-0.9 MG/250ML-% IV SOLN
INTRAVENOUS | Status: DC | PRN
  Administered 2022-07-23: 40 ug/min via INTRAVENOUS

## 2022-07-23 MED ORDER — BUPIVACAINE HCL (PF) 0.25 % IJ SOLN
INTRAMUSCULAR | Status: AC
  Filled 2022-07-23: qty 30

## 2022-07-23 MED ORDER — MIDAZOLAM HCL 2 MG/2ML IJ SOLN
INTRAMUSCULAR | Status: AC
  Filled 2022-07-23: qty 2

## 2022-07-23 MED ORDER — ACETAMINOPHEN 500 MG PO TABS
ORAL_TABLET | ORAL | Status: AC
  Filled 2022-07-23: qty 2

## 2022-07-23 MED ORDER — LACTATED RINGERS IV SOLN: INTRAVENOUS | Status: DC

## 2022-07-23 MED ORDER — MIDAZOLAM HCL 2 MG/2ML IJ SOLN
2.0000 mg | Freq: Once | INTRAMUSCULAR | Status: AC
  Administered 2022-07-23: 2 mg via INTRAVENOUS

## 2022-07-23 MED ORDER — EPINEPHRINE PF 1 MG/ML IJ SOLN
INTRAMUSCULAR | Status: AC
  Filled 2022-07-23: qty 8

## 2022-07-23 MED ORDER — SODIUM CHLORIDE 0.9 % IR SOLN
Status: DC | PRN
  Administered 2022-07-23: 3000 mL

## 2022-07-23 MED ORDER — HYDROMORPHONE HCL 1 MG/ML IJ SOLN: 0.2500 mg | INTRAMUSCULAR | Status: DC | PRN

## 2022-07-23 MED ORDER — GABAPENTIN 300 MG PO CAPS
ORAL_CAPSULE | ORAL | Status: AC
  Filled 2022-07-23: qty 1

## 2022-07-23 MED ORDER — SUCCINYLCHOLINE CHLORIDE 200 MG/10ML IV SOSY
PREFILLED_SYRINGE | INTRAVENOUS | Status: DC | PRN
  Administered 2022-07-23: 80 mg via INTRAVENOUS

## 2022-07-23 MED ORDER — LIDOCAINE HCL (CARDIAC) PF 100 MG/5ML IV SOSY
PREFILLED_SYRINGE | INTRAVENOUS | Status: DC | PRN
  Administered 2022-07-23: 100 mg via INTRAVENOUS

## 2022-07-23 MED ORDER — ACETAMINOPHEN 500 MG PO TABS
1000.0000 mg | ORAL_TABLET | Freq: Once | ORAL | Status: AC
  Administered 2022-07-23: 1000 mg via ORAL

## 2022-07-23 MED ORDER — ONDANSETRON HCL 4 MG/2ML IJ SOLN: 4.0000 mg | Freq: Once | INTRAMUSCULAR | Status: DC | PRN

## 2022-07-23 MED ORDER — DEXAMETHASONE SODIUM PHOSPHATE 10 MG/ML IJ SOLN
INTRAMUSCULAR | Status: AC
  Filled 2022-07-23: qty 1

## 2022-07-23 MED ORDER — OXYCODONE HCL 5 MG/5ML PO SOLN: 5.0000 mg | Freq: Once | ORAL | Status: DC | PRN

## 2022-07-23 MED ORDER — CEFAZOLIN SODIUM-DEXTROSE 2-4 GM/100ML-% IV SOLN
2.0000 g | INTRAVENOUS | Status: AC
  Administered 2022-07-23: 3 g via INTRAVENOUS

## 2022-07-23 MED ORDER — KETOROLAC TROMETHAMINE 30 MG/ML IJ SOLN: 30.0000 mg | Freq: Once | INTRAMUSCULAR | Status: DC | PRN

## 2022-07-23 MED ORDER — OXYCODONE HCL 5 MG PO TABS: 5.0000 mg | ORAL_TABLET | Freq: Once | ORAL | Status: DC | PRN

## 2022-07-23 MED ORDER — TRANEXAMIC ACID-NACL 1000-0.7 MG/100ML-% IV SOLN
1000.0000 mg | INTRAVENOUS | Status: AC
  Administered 2022-07-23: 1000 mg via INTRAVENOUS

## 2022-07-23 MED ORDER — CEFAZOLIN SODIUM-DEXTROSE 1-4 GM/50ML-% IV SOLN
INTRAVENOUS | Status: AC
  Filled 2022-07-23: qty 50

## 2022-07-23 MED ORDER — ONDANSETRON HCL 4 MG/2ML IJ SOLN
INTRAMUSCULAR | Status: AC
  Filled 2022-07-23: qty 2

## 2022-07-23 MED ORDER — PROPOFOL 10 MG/ML IV BOLUS
INTRAVENOUS | Status: DC | PRN
  Administered 2022-07-23: 200 mg via INTRAVENOUS

## 2022-07-23 SURGICAL SUPPLY — 78 items
AID PSTN UNV HD RSTRNT DISP (MISCELLANEOUS) ×1
ANCH SUT KNTLS STRL SHLDR SYS (Anchor) ×1 IMPLANT
ANCHOR SUT QUATTRO KNTLS 4.5 (Anchor) IMPLANT
APL PRP STRL LF DISP 70% ISPRP (MISCELLANEOUS) ×1
APL SKNCLS STERI-STRIP NONHPOA (GAUZE/BANDAGES/DRESSINGS)
BENZOIN TINCTURE PRP APPL 2/3 (GAUZE/BANDAGES/DRESSINGS) IMPLANT
BLADE EXCALIBUR 4.0X13 (MISCELLANEOUS) ×1 IMPLANT
BLADE SURG 15 STRL LF DISP TIS (BLADE) IMPLANT
BLADE SURG 15 STRL SS (BLADE)
BURR OVAL 8 FLU 4.0X13 (MISCELLANEOUS) ×1 IMPLANT
CANNULA 5.75X71 LONG (CANNULA) IMPLANT
CANNULA 7X7 TWIST-IN (CANNULA) IMPLANT
CANNULA PASSPORT 5 (CANNULA) IMPLANT
CANNULA PASSPORT BUTTON 10-40 (CANNULA) IMPLANT
CANNULA TWIST IN 8.25X7CM (CANNULA) IMPLANT
CHLORAPREP W/TINT 26 (MISCELLANEOUS) ×2 IMPLANT
CLSR STERI-STRIP ANTIMIC 1/2X4 (GAUZE/BANDAGES/DRESSINGS) IMPLANT
COOLER ICEMAN CLASSIC (MISCELLANEOUS) ×1 IMPLANT
DRAPE IMP U-DRAPE 54X76 (DRAPES) ×1 IMPLANT
DRAPE INCISE IOBAN 66X45 STRL (DRAPES) ×1 IMPLANT
DRAPE SHOULDER BEACH CHAIR (DRAPES) ×1 IMPLANT
DRAPE U-SHAPE 47X51 STRL (DRAPES) ×2 IMPLANT
DW OUTFLOW CASSETTE/TUBE SET (MISCELLANEOUS) ×1 IMPLANT
ELECT REM PT RETURN 9FT ADLT (ELECTROSURGICAL) ×1
ELECTRODE REM PT RTRN 9FT ADLT (ELECTROSURGICAL) ×1 IMPLANT
GAUZE PAD ABD 8X10 STRL (GAUZE/BANDAGES/DRESSINGS) ×1 IMPLANT
GAUZE SPONGE 4X4 12PLY STRL (GAUZE/BANDAGES/DRESSINGS) ×1 IMPLANT
GAUZE XEROFORM 1X8 LF (GAUZE/BANDAGES/DRESSINGS) ×1 IMPLANT
GLOVE BIO SURGEON STRL SZ 6 (GLOVE) ×2 IMPLANT
GLOVE BIO SURGEON STRL SZ7.5 (GLOVE) ×2 IMPLANT
GLOVE BIOGEL PI IND STRL 6.5 (GLOVE) ×1 IMPLANT
GLOVE BIOGEL PI IND STRL 8 (GLOVE) ×1 IMPLANT
GOWN STRL REUS W/ TWL LRG LVL3 (GOWN DISPOSABLE) ×2 IMPLANT
GOWN STRL REUS W/ TWL XL LVL3 (GOWN DISPOSABLE) ×1 IMPLANT
GOWN STRL REUS W/TWL LRG LVL3 (GOWN DISPOSABLE) ×2
GOWN STRL REUS W/TWL XL LVL3 (GOWN DISPOSABLE) ×2 IMPLANT
KIT STABILIZATION SHOULDER (MISCELLANEOUS) ×1 IMPLANT
KIT STR SPEAR 1.8 FBRTK DISP (KITS) IMPLANT
LASSO 90 CVE QUICKPAS (DISPOSABLE) IMPLANT
LASSO CRESCENT QUICKPASS (SUTURE) IMPLANT
LOOP 2 FIBERLINK CLOSED (SUTURE) IMPLANT
MANIFOLD NEPTUNE II (INSTRUMENTS) ×1 IMPLANT
NDL SAFETY ECLIP 18X1.5 (MISCELLANEOUS) ×1 IMPLANT
NDL SCORPION MULTI FIRE (NEEDLE) IMPLANT
NDL SUT PASSER RTC (NEEDLE) IMPLANT
NEEDLE SCORPION MULTI FIRE (NEEDLE) IMPLANT
NEEDLE SUT PASSER RTC (NEEDLE) ×1 IMPLANT
PACK ARTHROSCOPY DSU (CUSTOM PROCEDURE TRAY) ×1 IMPLANT
PACK BASIN DAY SURGERY FS (CUSTOM PROCEDURE TRAY) ×1 IMPLANT
PAD COLD SHLDR WRAP-ON (PAD) ×1 IMPLANT
PENCIL SMOKE EVACUATOR (MISCELLANEOUS) IMPLANT
PORT APPOLLO RF 90DEGREE MULTI (SURGICAL WAND) ×1 IMPLANT
RESTRAINT HEAD UNIVERSAL NS (MISCELLANEOUS) ×1 IMPLANT
SHEET MEDIUM DRAPE 40X70 STRL (DRAPES) ×1 IMPLANT
SLEEVE SCD COMPRESS KNEE MED (STOCKING) ×1 IMPLANT
SPONGE T-LAP 4X18 ~~LOC~~+RFID (SPONGE) ×1 IMPLANT
SUT BROADBAND TAPE 2PK 1.5 (SUTURE) IMPLANT
SUT ETHILON 3 0 PS 1 (SUTURE) ×1 IMPLANT
SUT FIBERWIRE #2 38 T-5 BLUE (SUTURE)
SUT MON AB 4-0 PS1 27 (SUTURE) IMPLANT
SUT PDS AB 1 CT  36 (SUTURE)
SUT PDS AB 1 CT 36 (SUTURE) IMPLANT
SUT TIGER TAPE 7 IN WHITE (SUTURE) IMPLANT
SUT VIC AB 0 CT1 27 (SUTURE)
SUT VIC AB 0 CT1 27XBRD ANBCTR (SUTURE) IMPLANT
SUT VIC AB 2-0 SH 27 (SUTURE)
SUT VIC AB 2-0 SH 27XBRD (SUTURE) IMPLANT
SUTURE FIBERWR #2 38 T-5 BLUE (SUTURE) IMPLANT
SUTURE TAPE 1.3 40 TPR END (SUTURE) IMPLANT
SUTURE TAPE TIGERLINK 1.3MM BL (SUTURE) IMPLANT
SUTURETAPE 1.3 40 TPR END (SUTURE)
SUTURETAPE TIGERLINK 1.3MM BL (SUTURE)
SYR 5ML LL (SYRINGE) ×1 IMPLANT
TAPE FIBER 2MM 7IN #2 BLUE (SUTURE) IMPLANT
TOWEL GREEN STERILE FF (TOWEL DISPOSABLE) ×2 IMPLANT
TUBE CONNECTING 20X1/4 (TUBING) ×1 IMPLANT
TUBING ARTHROSCOPY IRRIG 16FT (MISCELLANEOUS) ×1 IMPLANT
YANKAUER SUCT BULB TIP NO VENT (SUCTIONS) IMPLANT

## 2022-07-23 NOTE — Interval H&P Note (Signed)
History and Physical Interval Note:  07/23/2022 6:45 AM  Micheal Mckinney  has presented today for surgery, with the diagnosis of LEFT ROTATOR CUFF TEAR.  The various methods of treatment have been discussed with the patient and family. After consideration of risks, benefits and other options for treatment, the patient has consented to  Procedure(s): LEFT SHOULDER ARTHROSCOPY WITH ROTATOR CUFF REPAIR AND BICEPS TENODESIS (Left) as a surgical intervention.  The patient's history has been reviewed, patient examined, no change in status, stable for surgery.  I have reviewed the patient's chart and labs.  Questions were answered to the patient's satisfaction.     Vanetta Mulders

## 2022-07-23 NOTE — Anesthesia Procedure Notes (Signed)
Anesthesia Regional Block: Interscalene brachial plexus block   Pre-Anesthetic Checklist: , timeout performed,  Correct Patient, Correct Site, Correct Laterality,  Correct Procedure, Correct Position, site marked,  Risks and benefits discussed,  Surgical consent,  Pre-op evaluation,  At surgeon's request and post-op pain management  Laterality: Left  Prep: chloraprep       Needles:  Injection technique: Single-shot  Needle Type: Echogenic Stimulator Needle     Needle Length: 9cm      Additional Needles:   Procedures:,,,, ultrasound used (permanent image in chart),,     Nerve Stimulator or Paresthesia:  Response: 0.42 mA  Additional Responses:   Narrative:  Start time: 07/23/2022 7:02 AM End time: 07/23/2022 7:11 AM Injection made incrementally with aspirations every 5 mL.  Performed by: Personally  Anesthesiologist: Myrtie Soman, MD  Additional Notes: Patient tolerated the procedure well without complications

## 2022-07-23 NOTE — Anesthesia Postprocedure Evaluation (Signed)
Anesthesia Post Note  Patient: Micheal Mckinney  Procedure(s) Performed: LEFT SHOULDER ARTHROSCOPY WITH ROTATOR CUFF REPAIR AND BICEPS TENODESIS (Left: Shoulder)     Patient location during evaluation: PACU Anesthesia Type: General Level of consciousness: awake and alert Pain management: pain level controlled Vital Signs Assessment: post-procedure vital signs reviewed and stable Respiratory status: spontaneous breathing, nonlabored ventilation, respiratory function stable and patient connected to nasal cannula oxygen Cardiovascular status: blood pressure returned to baseline and stable Postop Assessment: no apparent nausea or vomiting Anesthetic complications: no  No notable events documented.  Last Vitals:  Vitals:   07/23/22 0930 07/23/22 0945  BP: 122/86 125/70  Pulse: 83 67  Resp: (!) 22 14  Temp:  (!) 36.3 C  SpO2: 94% 95%    Last Pain:  Vitals:   07/23/22 0945  TempSrc: Temporal  PainSc: 0-No pain                 Kami Kube S

## 2022-07-23 NOTE — Anesthesia Procedure Notes (Signed)
Anesthesia Procedure Image    

## 2022-07-23 NOTE — Transfer of Care (Signed)
Immediate Anesthesia Transfer of Care Note  Patient: [name]  Procedure(s) Performed: LEFT SHOULDER ARTHROSCOPY WITH ROTATOR CUFF REPAIR AND BICEPS TENODESIS (Left: Shoulder)  Patient Location: PACU  Anesthesia Type:GA combined with regional for post-op pain  Level of Consciousness: awake, alert , and oriented  Airway & Oxygen Therapy: Patient Spontanous Breathing and Patient connected to face mask oxygen  Post-op Assessment: Report given to RN and Post -op Vital signs reviewed and stable  Post vital signs: Reviewed and stable  Last Vitals:  Vitals Value Taken Time  BP    Temp    Pulse 75 07/23/22 0921  Resp 17 07/23/22 0921  SpO2 99 % 07/23/22 0921  Vitals shown include unvalidated device data.  Last Pain:  Vitals:   07/23/22 0628  TempSrc: Oral  PainSc: 0-No pain      Patients Stated Pain Goal: 3 (49/70/26 [id number])  Complications: No notable events documented.

## 2022-07-23 NOTE — Anesthesia Preprocedure Evaluation (Signed)
Anesthesia Evaluation  Patient identified by MRN, date of birth, ID band Patient awake    Reviewed: Allergy & Precautions, H&P , NPO status , Patient's Chart, lab work & pertinent test results  Airway Mallampati: III  TM Distance: <3 FB Neck ROM: Full    Dental no notable dental hx.    Pulmonary sleep apnea , former smoker   Pulmonary exam normal breath sounds clear to auscultation       Cardiovascular hypertension, Normal cardiovascular exam Rhythm:Regular Rate:Normal     Neuro/Psych negative neurological ROS  negative psych ROS   GI/Hepatic negative GI ROS, Neg liver ROS,,,  Endo/Other    Morbid obesity  Renal/GU negative Renal ROS  negative genitourinary   Musculoskeletal negative musculoskeletal ROS (+)    Abdominal   Peds negative pediatric ROS (+)  Hematology negative hematology ROS (+)   Anesthesia Other Findings   Reproductive/Obstetrics negative OB ROS                             Anesthesia Physical Anesthesia Plan  ASA: 3  Anesthesia Plan: General   Post-op Pain Management: Regional block*   Induction: Intravenous  PONV Risk Score and Plan: 2 and Ondansetron, Dexamethasone and Treatment may vary due to age or medical condition  Airway Management Planned: Oral ETT  Additional Equipment:   Intra-op Plan:   Post-operative Plan: Extubation in OR  Informed Consent: I have reviewed the patients History and Physical, chart, labs and discussed the procedure including the risks, benefits and alternatives for the proposed anesthesia with the patient or authorized representative who has indicated his/her understanding and acceptance.     Dental advisory given  Plan Discussed with: CRNA and Surgeon  Anesthesia Plan Comments:        Anesthesia Quick Evaluation

## 2022-07-23 NOTE — Brief Op Note (Signed)
   Brief Op Note  Date of Surgery: 07/23/2022  Preoperative Diagnosis: LEFT ROTATOR CUFF TEAR  Postoperative Diagnosis: same  Procedure: Procedure(s): LEFT SHOULDER ARTHROSCOPY WITH ROTATOR CUFF REPAIR AND BICEPS TENODESIS  Implants: Implant Name Type Inv. Item Serial No. Manufacturer Lot No. LRB No. Used Action  ANCHOR SUT QUATTRO KNTLS 4.5 - [id number] Anchor ANCHOR SUT QUATTRO KNTLS 4.5  [employer](ORTH,TRAU,BIO,SG) [id number] Left 1 Implanted    Surgeons: Surgeon(s): Vanetta Mulders, MD  Anesthesia: General    Estimated Blood Loss: See anesthesia record  Complications: None  Condition to PACU: Stable  Yevonne Pax, MD 07/23/2022 9:13 AM

## 2022-07-23 NOTE — Op Note (Signed)
Date of Surgery: 07/23/2022  INDICATIONS: Mr. Wanat is a 52 y.o.-year-old male with left shoulder subscapularis tear with biceps subluxation and undersurface rotator cuff tear of the supraspinatus.  The risk and benefits of the procedure were discussed in detail and documented in the pre-operative evaluation.   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  POSTOPERATIVE DIAGNOSIS: Same.  PROCEDURE: Arthroscopic limited debridement - 17510 Arthroscopic rotator cuff repair - 25852 Arthroscopic biceps tenodesis - 77824  SURGEON: Yevonne Pax MD  ASSISTANT: Raynelle Fanning, ATC  ANESTHESIA:  general plus interscalene nerve block  IV FLUIDS AND URINE: See anesthesia record.  ANTIBIOTICS: Ancef  ESTIMATED BLOOD LOSS: 10 mL.  IMPLANTS:  Implant Name Type Inv. Item Serial No. Manufacturer Lot No. LRB No. Used Action  ANCHOR SUT QUATTRO KNTLS 4.5 - [id number] Anchor ANCHOR SUT QUATTRO KNTLS 4.5  [employer](ORTH,TRAU,BIO,SG) [id number] Left 1 Implanted    DRAINS: None  CULTURES: None  COMPLICATIONS: none  PROCEDURE:    OPERATIVE FINDING: Exam under anesthesia:   Examination under anesthesia revealed forward elevation of 150 degrees.  With the arm at the side, there was 65 degrees of external rotation.  There is a 1+ anterior load shift and a 1+ posterior load shift.    Arthroscopic findings demonstrated: Articular space: Intact Chondral surfaces: Normal Biceps: Undersurface fraying at the insertion of the labrum Subscapularis: Tear at the 10% cephalad border with biceps subluxation Supraspinatus: Undersurface tearing approximately 2% of the undersurface footprint Infraspinatus: Intact    I identified the patient in the pre-operative holding area.  I marked the operative right shoulder with my initials. I reviewed the risks and benefits of the proposed surgical intervention and the patient wished to proceed.  Anesthesia was  then performed with regional block.  The patient was transferred to the operative suite and placed in the beach chair position with all bony prominences padded.     SCDs were placed on bilateral lower extremity. Appropriate antibiotics was administered within 1 hour before incision.  Anesthesia was induced.  The operative extremity was then prepped and draped in standard fashion. A time out was performed confirming the correct extremity, correct patient and correct procedure.   The arthroscope was introduced in the glenohumeral joint from a posterior portal.  An anterior portal was created.  The shoulder was examined and the above findings were noted.     With an arthroscopic shaver and a wand ablator, synovitis throughout the  shoulder was resected.  The arthroscopic shaver was used to excise torn portions of the labrum back to a stable margin. Specifically this was done for the anterior superior and posterior labrum. At this time the biceps was tagged with a self passing device with a #2 nonabsorbable suture.  This was done through the anterior portal.  A BirdBeak was then brought through the biceps tendon in order to regrab the suture in a locking fashion.  The biceps was intact with arthroscopic scissors and the stump was cauterized to shrink this particularly at the base of the labrum.  The upper border of the subscapularis was tagged with a #2 nonabsorbable suture and these were all subsequently placed into a 4.5 mm anchor into the lesser tuberosity footprint near the native insertion of the biceps.  The remaining biceps stump was shrunk with cauterization   The shoulder was irrigated.  The arthroscopic instruments were removed.  Wounds were closed with 3-0 nylon sutures.  A sterile dressing was applied with xeroform, 4x8s,  abdominal pad, and tape. An Flonnie Hailstone was placed and the upper extremity was placed in a shoulder immobilizer.  The patient tolerated the procedure well and was taken to the recovery  room in stable condition.  All counts were correct in the case. The patient tolerated the procedure well and was taken to the recovery room in stable condition.    POSTOPERATIVE PLAN: He will follow the subscapularis and biceps tendon rehab protocol.  He may begin early active flexion in the scapular plane.  He will avoid active internal rotation or resisted external rotation.  He will follow-up in 2 weeks for suture removal.  He will be placed on aspirin for blood clot prevention.  Benancio Deeds, MD 9:17 AM

## 2022-07-23 NOTE — Progress Notes (Signed)
Assisted Dr. Rose with left, interscalene , ultrasound guided block. Side rails up, monitors on throughout procedure. See vital signs in flow sheet. Tolerated Procedure well. 

## 2022-07-23 NOTE — Anesthesia Procedure Notes (Signed)
Procedure Name: Intubation Date/Time: 07/23/2022 7:45 AM  Performed by: Maryella Shivers, CRNAPre-anesthesia Checklist: Patient identified, Emergency Drugs available, Suction available and Patient being monitored Patient Re-evaluated:Patient Re-evaluated prior to induction Oxygen Delivery Method: Circle system utilized Preoxygenation: Pre-oxygenation with 100% oxygen Induction Type: IV induction Ventilation: Mask ventilation without difficulty Laryngoscope Size: Mac and 4 Grade View: Grade II Tube type: Oral Tube size: 8.0 mm Number of attempts: 1 Airway Equipment and Method: Stylet and Oral airway Placement Confirmation: ETT inserted through vocal cords under direct vision, positive ETCO2 and breath sounds checked- equal and bilateral Secured at: 24 cm Tube secured with: Tape Dental Injury: Teeth and Oropharynx as per pre-operative assessment

## 2022-07-23 NOTE — Discharge Instructions (Addendum)
Discharge Instructions    Attending Surgeon: Huel Cote, MD Office Phone Number: [phone]   Diagnosis and Procedures:    Surgeries Performed: Left shoulder subscapularis repair with bicep tenodesis  Discharge Plan:    Diet: Resume usual diet. Begin with light or bland foods.  Drink plenty of fluids.  Activity:  Keep sling and dressing in place until your follow up visit in Physical Therapy You are advised to go home directly from the hospital or surgical center. Restrict your activities.  GENERAL INSTRUCTIONS: 1.  Keep your surgical site elevated above your heart for at least 5-7 days or longer to prevent swelling. This will improve your comfort and your overall recovery following surgery.     2. Please call Dr. Serena Croissant office at [phone] with questions Monday-Friday during business hours. If no one answers, please leave a message and someone should get back to the patient within 24 hours. For emergencies please call 911 or proceed to the emergency room.   3. Patient to notify surgical team if experiences any of the following: Bowel/Bladder dysfunction, uncontrolled pain, nerve/muscle weakness, incision with increased drainage or redness, nausea/vomiting and Fever greater than 101.0 F.  Be alert for signs of infection including redness, streaking, odor, fever or chills. Be alert for excessive pain or bleeding and notify your surgeon immediately.  WOUND INSTRUCTIONS:   Leave your dressing/cast/splint in place until your post operative visit.  Keep it clean and dry.  Always keep the incision clean and dry until the staples/sutures are removed. If there is no drainage from the incision you should keep it open to air. If there is drainage from the incision you must keep it covered at all times until the drainage stops  Do not soak in a bath tub, hot tub, pool, lake or other body of water until 21 days after your surgery and your incision is completely dry and  healed.  If you have removable sutures (or staples) they must be removed 10-14 days (unless otherwise instructed) from the day of your surgery.     1)  Elevate the extremity as much as possible.  2)  Keep the dressing clean and dry.  3)  Please call us if the dressing becomes wet or dirty.  4)  If you are experiencing worsening pain or worsening swelling, please call.     MEDICATIONS: Resume all previous home medications at the previous prescribed dose and frequency unless otherwise noted Start taking the  pain medications on an as-needed basis as prescribed  Please taper down pain medication over the next week following surgery.  Ideally you should not require a refill of any narcotic pain medication.  Take pain medication with food to minimize nausea. In addition to the prescribed pain medication, you may take over-the-counter pain relievers such as Tylenol.  Do NOT take additional tylenol if your pain medication already has tylenol in it.  Aspirin 325mg  daily for four weeks.      FOLLOWUP INSTRUCTIONS: 1. Follow up at the Physical Therapy Clinic 3-4 days following surgery. This appointment should be scheduled unless other arrangements have been made.The Physical Therapy scheduling number is [phone] if an appointment has not already been arranged.  2. Contact Dr. [phone] office during office hours at [phone] or the practice after hours line at [phone] for non-emergencies. For medical emergencies call 911.   Discharge Location: Home    Post Anesthesia Home Care Instructions  Activity: Get plenty of rest for the remainder of  the day. A responsible individual must stay with you for 24 hours following the procedure.  For the next 24 hours, DO NOT: -Drive a car -Advertising copywriter -Drink alcoholic beverages -Take any medication unless instructed by your physician -Make any legal decisions or sign important papers.  Meals: Start with liquid foods such as gelatin  or soup. Progress to regular foods as tolerated. Avoid greasy, spicy, heavy foods. If nausea and/or vomiting occur, drink only clear liquids until the nausea and/or vomiting subsides. Call your physician if vomiting continues.  Special Instructions/Symptoms: Your throat may feel dry or sore from the anesthesia or the breathing tube placed in your throat during surgery. If this causes discomfort, gargle with warm salt water. The discomfort should disappear within 24 hours.  If you had a scopolamine patch placed behind your ear for the management of post- operative nausea and/or vomiting:  1. The medication in the patch is effective for 72 hours, after which it should be removed.  Wrap patch in a tissue and discard in the trash. Wash hands thoroughly with soap and water. 2. You may remove the patch earlier than 72 hours if you experience unpleasant side effects which may include dry mouth, dizziness or visual disturbances. 3. Avoid touching the patch. Wash your hands with soap and water after contact with the patch.    Regional Anesthesia Blocks  1. Numbness or the inability to move the "blocked" extremity may last from 3-48 hours after placement. The length of time depends on the medication injected and your individual response to the medication. If the numbness is not going away after 48 hours, call your surgeon.  2. The extremity that is blocked will need to be protected until the numbness is gone and the  Strength has returned. Because you cannot feel it, you will need to take extra care to avoid injury. Because it may be weak, you may have difficulty moving it or using it. You may not know what position it is in without looking at it while the block is in effect.  3. For blocks in the legs and feet, returning to weight bearing and walking needs to be done carefully. You will need to wait until the numbness is entirely gone and the strength has returned. You should be able to move your leg and  foot normally before you try and bear weight or walk. You will need someone to be with you when you first try to ensure you do not fall and possibly risk injury.  4. Bruising and tenderness at the needle site are common side effects and will resolve in a few days.  5. Persistent numbness or new problems with movement should be communicated to the surgeon or the [facility] [phone] [facility] [phone]).Information for Discharge Teaching: EXPAREL (bupivacaine liposome injectable suspension)   Your surgeon or anesthesiologist gave you EXPAREL(bupivacaine) to help control your pain after surgery.  EXPAREL is a local anesthetic that provides pain relief by numbing the tissue around the surgical site. EXPAREL is designed to release pain medication over time and can control pain for up to 72 hours. Depending on how you respond to EXPAREL, you may require less pain medication during your recovery.  Possible side effects: Temporary loss of sensation or ability to move in the area where bupivacaine was injected. Nausea, vomiting, constipation Rarely, numbness and tingling in your mouth or lips, lightheadedness, or anxiety may occur. Call your doctor right away if you think you may be  experiencing any of these sensations, or if you have other questions regarding possible side effects.  Follow all other discharge instructions given to you by your surgeon or nurse. Eat a healthy diet and drink plenty of water or other fluids.  If you return to the hospital for any reason within 96 hours following the administration of EXPAREL, it is important for health care providers to know that you have received this anesthetic. A teal colored band has been placed on your arm with the date, time and amount of EXPAREL you have received in order to alert and inform your health care providers. Please leave this armband in place for the full 96 hours following administration, and then you  may remove the band.  *May have Tylenol today 12:35pm

## 2022-07-24 ENCOUNTER — Encounter (HOSPITAL_BASED_OUTPATIENT_CLINIC_OR_DEPARTMENT_OTHER): Payer: Self-pay | Admitting: Orthopaedic Surgery

## 2022-07-26 ENCOUNTER — Ambulatory Visit (HOSPITAL_BASED_OUTPATIENT_CLINIC_OR_DEPARTMENT_OTHER): Payer: 59 | Attending: Orthopaedic Surgery | Admitting: Physical Therapy

## 2022-07-26 ENCOUNTER — Encounter (HOSPITAL_BASED_OUTPATIENT_CLINIC_OR_DEPARTMENT_OTHER): Payer: Self-pay | Admitting: Physical Therapy

## 2022-07-26 DIAGNOSIS — M25612 Stiffness of left shoulder, not elsewhere classified: Secondary | ICD-10-CM | POA: Insufficient documentation

## 2022-07-26 DIAGNOSIS — M25512 Pain in left shoulder: Secondary | ICD-10-CM | POA: Insufficient documentation

## 2022-07-26 DIAGNOSIS — M75122 Complete rotator cuff tear or rupture of left shoulder, not specified as traumatic: Secondary | ICD-10-CM | POA: Diagnosis not present

## 2022-07-26 DIAGNOSIS — G8929 Other chronic pain: Secondary | ICD-10-CM | POA: Insufficient documentation

## 2022-07-26 DIAGNOSIS — M6281 Muscle weakness (generalized): Secondary | ICD-10-CM | POA: Insufficient documentation

## 2022-07-26 NOTE — Therapy (Signed)
OUTPATIENT PHYSICAL THERAPY SHOULDER EVALUATION   Patient Name: Micheal Mckinney MRN: [id number] DOB:1971-03-24, 52 y.o., male Today's Date: 07/26/2022  END OF SESSION:  PT End of Session - 07/26/22 0949     Visit Number 1    Number of Visits 25    Date for PT Re-Evaluation 09/20/22    Authorization Type [employer]    Authorization Time Period 07/25/22 to 10/04/22    PT Start Time 0847    PT Stop Time 0930    PT Time Calculation (min) 43 min    Activity Tolerance Patient tolerated treatment well    Behavior During Therapy [facility] for tasks assessed/performed             Past Medical History:  Diagnosis Date   Allergy    hay fever   Foreign body (FB) in soft tissue    right palm   GERD (gastroesophageal reflux disease)    History of chicken pox    Hyperlipidemia    Hypertension    OSA (obstructive sleep apnea) 01/26/2012   does not use CPAP   Past Surgical History:  Procedure Laterality Date   CARPAL TUNNEL RELEASE Bilateral 05/04/2013   CERVICAL FUSION     Alternative to this done in 05/2017, disc replacement   FOREIGN BODY REMOVAL Right 05/18/2018   Procedure: REMOVAL FOREIGN BODY RIGHT PALM;  Surgeon: Betha Loa, MD;  Location: [facility];  Service: Orthopedics;  Laterality: Right;   IRRIGATION AND DEBRIDEMENT SEBACEOUS CYST  2009    on back and neck   SHOULDER ARTHROSCOPY WITH ROTATOR CUFF REPAIR AND OPEN BICEPS TENODESIS Left 07/23/2022   Procedure: LEFT SHOULDER ARTHROSCOPY WITH ROTATOR CUFF REPAIR AND BICEPS TENODESIS;  Surgeon: Huel Cote, MD;  Location: [facility];  Service: Orthopedics;  Laterality: Left;   Patient Active Problem List   Diagnosis Date Noted   Nontraumatic complete tear of left rotator cuff 07/23/2022   Biceps tendinitis of left upper extremity 07/23/2022   Visit for preventive health examination 10/31/2015   Acute drug-induced gout of right foot 08/15/2015   Hyperlipidemia 05/10/2013   Routine general  medical examination at a health care facility 05/07/2013   Carpal tunnel syndrome 01/12/2013   HTN (hypertension) 01/26/2012   OSA (obstructive sleep apnea) 01/26/2012   Obesity 01/26/2012    PCP: Meredith Staggers MD   REFERRING PROVIDER: Huel Cote, MD  REFERRING DIAG:  [id number] (ICD-10-CM) - Nontraumatic complete tear of left rotator cuff    THERAPY DIAG:  Chronic left shoulder pain  Muscle weakness (generalized)  Stiffness of left shoulder, not elsewhere classified  Rationale for Evaluation and Treatment: Rehabilitation  ONSET DATE: 06/28/2022  SUBJECTIVE:  SUBJECTIVE STATEMENT:  Had surgery 07/23/22 for shoulder scope, RCR, biceps work too. Haven't moved shoulder much I don't know what to do with it. BP was getting a bit high yesterday,  I stopped taking pain meds bc of this yesterday. I'm not feeling well today honestly.   PERTINENT HISTORY: 52 year old male with left shoulder subscapularis tear as well as biceps tendinitis.  At this time he is trialed multiple months of the band strengthening program as well as multiple injections into both shoulders.  He is not getting any persistent relief from his last injection in the left shoulder.  That effect I do believe that he would potentially be a candidate for arthroscopy with subscapularis repair and biceps tenodesis.  We discussed the limitations and restrictions associated with this.  We discussed the complications and possible outcomes associated with this.  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  He understands all of this.  He would like to proceed with left shoulder arthroscopy. Plan :     -Plan for left shoulder arthroscopy with subscapularis repair and biceps tenodesis  POSTOPERATIVE PLAN:  He will follow the subscapularis and biceps tendon rehab protocol.  He may begin early active flexion in the scapular plane.  He will avoid active internal rotation or resisted external rotation.  He will follow-up in 2 weeks for suture removal.  He will be placed on aspirin for blood clot prevention.  PAIN:  Are you having pain? Yes: NPRS scale: "its hard to rate pain, maybe 1/10 at rest"/10 Pain location: L shoulder in general  Pain description: dull pain  Aggravating factors: unsure  Relieving factors: unsure   PRECAUTIONS: Other: post op protocol   WEIGHT BEARING RESTRICTIONS: Yes NWB surgical UE   FALLS:  Has patient fallen in last 6 months? 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 Sammuel Hines in about 2 weeks   OBJECTIVE:   DIAGNOSTIC FINDINGS:    PATIENT SURVEYS:  FOTO will do 2nd session   COGNITION: Overall cognitive status: Within functional limits for tasks assessed     SENSATION:   POSTURE:  Forward head, rounded shoulders, increased thoracic kyphosis   UPPER EXTREMITY ROM:   Passive ROM Right eval Left eval  Shoulder flexion  95*  Shoulder extension    Shoulder abduction  Scaption PROM 100*  Shoulder adduction    Shoulder internal rotation  Full IR PROM  with elbow at 0* ABD    Shoulder external rotation  Approximately 30* at 0* ABD   Elbow flexion    Elbow extension    Wrist flexion    Wrist extension    Wrist ulnar deviation    Wrist radial deviation    Wrist pronation    Wrist supination    (Blank rows = not  tested)  UPPER EXTREMITY MMT:  MMT Right eval Left eval  Shoulder flexion    Shoulder extension    Shoulder abduction    Shoulder adduction    Shoulder internal rotation    Shoulder external rotation  Middle trapezius    Lower trapezius    Elbow flexion    Elbow extension    Wrist flexion    Wrist extension    Wrist ulnar deviation    Wrist radial deviation    Wrist pronation    Wrist supination    Grip strength (lbs)    (Blank rows = not tested)  DNT due to precautions post op   SHOULDER SPECIAL TESTS:  JOINT MOBILITY TESTING:    PALPATION:    Cervical paraspinals sore but at baseline per pt report, trigger point noted in R UT TODAY'S TREATMENT:                                                                                                                                         DATE:   Eval  Objective measures + appropriate education  BP 160/95 HR 59  Incisions C/D/I, seemed slightly red but no drainage noted with palpation around incisions    PATIENT EDUCATION: Education details: HEP, POC, skin care, exam findings, importance of respecting healing time of tissues, call MD about BP and for reccs about pain meds and shower, importance of pain management   Person educated: Patient Education method: Explanation, Demonstration, and Handouts Education comprehension: verbalized understanding, returned demonstration, and needs further education  HOME EXERCISE PROGRAM:  Access Code: [email or web address] URL: [email or web address] Date: 07/26/2022 Prepared by: Nedra Hai  Exercises - Flexion-Extension Shoulder Pendulum with Table Support  - 1 x daily - 7 x weekly - 3 sets - 10 reps - Supine Shoulder Flexion PROM to 90 Degrees  - 1 x daily - 7 x weekly - 3 sets - 10 reps - Circular Shoulder Pendulum with Table Support  - 1 x daily - 7 x weekly - 3 sets - 10 reps - Seated Elbow Flexion PROM with Caregiver  - 1 x daily - 7 x weekly - 3 sets - 10 reps -  Seated Gripping Towel  - 1 x daily - 7 x weekly - 3 sets - 10 reps  ASSESSMENT:  CLINICAL IMPRESSION: Patient is a 52 y.o. M who was seen today for physical therapy evaluation and treatment for skilled PT services after shoulder surgery. Exam is typical and as expected incluing limited shoulder ROM and strength, postural limitations, and shoulder pain. Will benefit from skilled PT services to assist in return to work and overall optimal level of function.   OBJECTIVE IMPAIRMENTS: decreased ROM, decreased strength, hypomobility, increased edema, increased fascial restrictions, increased muscle spasms, impaired flexibility, postural dysfunction, obesity, and pain.   ACTIVITY LIMITATIONS: carrying, lifting, bathing, toileting, dressing, reach over head, hygiene/grooming, and caring for others  PARTICIPATION LIMITATIONS: driving, shopping, community activity, and occupation  PERSONAL FACTORS: Behavior pattern, Education, Fitness, and Past/current experiences are also affecting patient's functional outcome.   REHAB POTENTIAL: Good  CLINICAL DECISION MAKING: Stable/uncomplicated  EVALUATION COMPLEXITY: Low   GOALS: Goals reviewed  with patient? Yes  SHORT TERM GOALS: Target date: 08/23/2022    Will be compliant with appropriate progressive HEP as appropriate post-op Baseline: Goal status: INITIAL  2.  Surgical shoulder AROM and PROM to be full without increased pain  Baseline:  Goal status: INITIAL  3.  Pain in surgical shoulder to be no more than 2/10 with functional activities in PT and HEP  Baseline:  Goal status: INITIAL  4.  Will have better understanding of postural liimitations and general ergonomics  Baseline:  Goal status: INITIAL    LONG TERM GOALS: Target date: 09/20/2022    MMT in surgical shoulder to be at least 4/5 in all tested groups  Baseline:  Goal status: INITIAL  2.  Will have been able to return to work on limited duty basis as deemed appropriate by MD  without increase in pain  Baseline:  Goal status: INITIAL  3.  Will be able to reach overhead to pull a fan or light switch with surgical UE without increase in pain Baseline:  Goal status: INITIAL  4.  Pain in surgical shoulder with functional activities at home and work to be no more than 2/10 Baseline:  Goal status: INITIAL    PLAN:  PT FREQUENCY:  2-3x/week   PT DURATION: 8 weeks  PLANNED INTERVENTIONS: Therapeutic exercises, Therapeutic activity, Neuromuscular re-education, Balance training, Gait training, Patient/Family education, Self Care, Joint mobilization, Aquatic Therapy, Dry Needling, Electrical stimulation, Cryotherapy, Moist heat, Taping, Ultrasound, Ionotophoresis 4mg /ml Dexamethasone, Manual therapy, and Re-evaluation  PLAN FOR NEXT SESSION: per protocol (biceps tendon protocol per op note)  Deniece Ree PT DPT PN2

## 2022-07-31 ENCOUNTER — Encounter (HOSPITAL_BASED_OUTPATIENT_CLINIC_OR_DEPARTMENT_OTHER): Payer: Self-pay | Admitting: Orthopaedic Surgery

## 2022-08-01 ENCOUNTER — Encounter (HOSPITAL_BASED_OUTPATIENT_CLINIC_OR_DEPARTMENT_OTHER): Payer: Self-pay | Admitting: Physical Therapy

## 2022-08-01 ENCOUNTER — Ambulatory Visit (HOSPITAL_BASED_OUTPATIENT_CLINIC_OR_DEPARTMENT_OTHER): Payer: 59 | Admitting: Physical Therapy

## 2022-08-01 DIAGNOSIS — G8929 Other chronic pain: Secondary | ICD-10-CM

## 2022-08-01 DIAGNOSIS — M25612 Stiffness of left shoulder, not elsewhere classified: Secondary | ICD-10-CM

## 2022-08-01 DIAGNOSIS — M6281 Muscle weakness (generalized): Secondary | ICD-10-CM

## 2022-08-01 NOTE — Therapy (Signed)
OUTPATIENT PHYSICAL THERAPY SHOULDER EVALUATION   Patient Name: Micheal Mckinney MRN: [id number] DOB:[birth year], 52 y.o., male Today's Date: 08/01/2022  END OF SESSION:  PT End of Session - 08/01/22 0800     Visit Number 2    Number of Visits 25    Date for PT Re-Evaluation 09/20/22    Authorization Type U[employer]    Authorization Time Period 07/25/22 to 10/04/22    PT Start Time 0800    PT Stop Time 0830    PT Time Calculation (min) 30 min    Activity Tolerance Patient tolerated treatment well    Behavior During Therapy Agitated;Flat affect             Past Medical History:  Diagnosis Date   Allergy    hay fever   Foreign body (FB) in soft tissue    right palm   GERD (gastroesophageal reflux disease)    History of chicken pox    Hyperlipidemia    Hypertension    OSA (obstructive sleep apnea) 01/26/2012   does not use CPAP   Past Surgical History:  Procedure Laterality Date   CARPAL TUNNEL RELEASE Bilateral 05/04/2013   CERVICAL FUSION     Alternative to this done in 05/2017, disc replacement   FOREIGN BODY REMOVAL Right 05/18/2018   Procedure: REMOVAL FOREIGN BODY RIGHT PALM;  Surgeon: Betha Loa, MD;  Location: [facility];  Service: Orthopedics;  Laterality: Right;   IRRIGATION AND DEBRIDEMENT SEBACEOUS CYST  2009    on back and neck   SHOULDER ARTHROSCOPY WITH ROTATOR CUFF REPAIR AND OPEN BICEPS TENODESIS Left 07/23/2022   Procedure: LEFT SHOULDER ARTHROSCOPY WITH ROTATOR CUFF REPAIR AND BICEPS TENODESIS;  Surgeon: Huel Cote, MD;  Location: [facility];  Service: Orthopedics;  Laterality: Left;   Patient Active Problem List   Diagnosis Date Noted   Nontraumatic complete tear of left rotator cuff 07/23/2022   Biceps tendinitis of left upper extremity 07/23/2022   Visit for preventive health examination 10/31/2015   Acute drug-induced gout of right foot 08/15/2015   Hyperlipidemia 05/10/2013   Routine general medical  examination at a health care facility 05/07/2013   Carpal tunnel syndrome 01/12/2013   HTN (hypertension) 01/26/2012   OSA (obstructive sleep apnea) 01/26/2012   Obesity 01/26/2012    PCP: Meredith Staggers MD   REFERRING PROVIDER: Huel Cote, MD  REFERRING DIAG:  [phone] (ICD-10-CM) - Nontraumatic complete tear of left rotator cuff    THERAPY DIAG:  Chronic left shoulder pain  Muscle weakness (generalized)  Stiffness of left shoulder, not elsewhere classified  Rationale for Evaluation and Treatment: Rehabilitation  ONSET DATE:  07/23/22 DOS   Days since surgery: 9  SUBJECTIVE:  SUBJECTIVE STATEMENT:  Pt states he has no pain. Has been doing pendulums. "I just had shoulder surgery." Pt presents to session without sling.    Eval: Had surgery 07/23/22 for shoulder scope, RCR, biceps work too. Haven't moved shoulder much I don't know what to do with it. BP was getting a bit high yesterday,  I stopped taking pain meds bc of this yesterday. I'm not feeling well today honestly.   PERTINENT HISTORY: FROM MD NOTE 52 year old male with left shoulder subscapularis tear as well as biceps tendinitis.  At this time he is trialed multiple months of the band strengthening program as well as multiple injections into both shoulders.  He is not getting any persistent relief from his last injection in the left shoulder.  That effect I do believe that he would potentially be a candidate for arthroscopy with subscapularis repair and biceps tenodesis.  We discussed the limitations and restrictions associated with this.  We discussed the complications and possible outcomes associated with this.  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  He understands all of this.  He would  like to proceed with left shoulder arthroscopy. Plan :     -Plan for left shoulder arthroscopy with subscapularis repair and biceps tenodesis  POSTOPERATIVE PLAN: He will follow the subscapularis and biceps tendon rehab protocol.  He may begin early active flexion in the scapular plane.  He will avoid active internal rotation or resisted external rotation.  He will follow-up in 2 weeks for suture removal.  He will be placed on aspirin for blood clot prevention.  PAIN:  Are you having pain? Yes: NPRS scale: "0.5/10 Pain location: L shoulder in general  Pain description: dull pain  Aggravating factors: unsure  Relieving factors: unsure   PRECAUTIONS: Other: post op protocol   WEIGHT BEARING RESTRICTIONS: Yes NWB surgical UE   FALLS:  Has patient fallen in last 6 months? 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 Sammuel Hines in about 2 weeks   OBJECTIVE:    PATIENT SURVEYS:  FOTO unavailable; give at next available session  UPPER EXTREMITY ROM:   AROM scaption to 75 deg  Passive ROM Right eval Left eval L 1/18  Shoulder flexion  95* 95  Shoulder extension     Shoulder abduction  Scaption PROM 100* 80  Shoulder adduction     Shoulder internal rotation  Full IR PROM  with elbow at 0* ABD   Full  Shoulder external rotation  Approximately 30* at 0* ABD  neutral  Elbow flexion     Elbow extension     Wrist flexion     Wrist extension     Wrist ulnar deviation     Wrist radial deviation     Wrist pronation     Wrist supination     (Blank rows = not  tested) TODAY'S TREATMENT:  DATE:   1/18  PROM to pt tolerance in flexion, scaption, and ABD; rotation kept to neutral ER, IR full   Exercises - Flexion-Extension Shoulder Pendulum with Table Support  - 2-3 x daily - 7 x weekly - 3 sets - 10 reps - Circular Shoulder Pendulum with Table Support  - 2-3 x daily - 7 x weekly - 3 sets - 10 reps - Seated Elbow Flexion AAROM  - 2-3 x daily - 7 x weekly - 2 sets - 10 reps - Supine Shoulder Flexion PROM to 90 Degrees  - 2-3 x daily - 7 x weekly - 3 sets - 10 reps - Seated Gripping Towel  - 2-3 x daily - 7 x weekly - 3 sets - 10 reps - Standing Shoulder Scaption  - 2-3 x daily - 7 x weekly - 1 sets - 10 reps   Eval  Objective measures + appropriate education  BP 160/95 HR 59  Incisions C/D/I, seemed slightly red but no drainage noted with palpation around incisions    PATIENT EDUCATION: Education details: anatomy, exercise progression, DOMS expectations, envelope of function, HEP, POC  Person educated: Patient Education method: Explanation, Demonstration, and Handouts Education comprehension: verbalized understanding, returned demonstration, and needs further education  HOME EXERCISE PROGRAM:   Access Code: [email or web address] URL: [email or web address] Date: 08/01/2022 Prepared by: Daleen Bo  ASSESSMENT:  CLINICAL IMPRESSION: Pt able to tolerate PROM, AAROM, and AROM today with expected soreness but without increase in pain during session. Pt strongly advised to continue with sling usage until return to MD and advised on precautions with post-op shoulder care given he is only 9 days out. Pt able to perform scaption today without irritation so provided for home. HEP reviewed in full. Plan to continue with PROM, AAROM, and AROM per protocol. Pt will benefit from continued skilled PT to assist in  return to occupation and PLOF.  OBJECTIVE IMPAIRMENTS: decreased ROM, decreased strength, hypomobility, increased edema, increased fascial restrictions, increased muscle spasms, impaired flexibility, postural dysfunction, obesity, and pain.   ACTIVITY LIMITATIONS: carrying, lifting, bathing, toileting, dressing, reach over head, hygiene/grooming, and caring for others  PARTICIPATION LIMITATIONS: driving, shopping, community activity, and occupation  PERSONAL FACTORS: Behavior pattern, Education, Fitness, and Past/current experiences are also affecting patient's functional outcome.   REHAB POTENTIAL: Good  CLINICAL DECISION MAKING: Stable/uncomplicated  EVALUATION COMPLEXITY: Low   GOALS: Goals reviewed with patient? Yes  SHORT TERM GOALS: Target date: 08/23/2022    Will be compliant with appropriate progressive HEP as appropriate post-op Baseline: Goal status: INITIAL  2.  Surgical shoulder AROM and PROM to be full without increased pain  Baseline:  Goal status: INITIAL  3.  Pain in surgical shoulder to be no more than 2/10 with functional activities in PT and HEP  Baseline:  Goal status: INITIAL  4.  Will have better understanding of postural liimitations and general ergonomics  Baseline:  Goal status: INITIAL    LONG TERM GOALS: Target date: 09/20/2022    MMT in surgical shoulder to be at least 4/5 in all tested groups  Baseline:  Goal status: INITIAL  2.  Will have been able to return to work on limited duty basis as deemed appropriate by MD without increase in pain  Baseline:  Goal status: INITIAL  3.  Will be able to reach overhead to pull a fan or light switch with surgical UE without increase in pain Baseline:  Goal status: INITIAL  4.  Pain in surgical shoulder with functional activities at  home and work to be no more than 2/10 Baseline:  Goal status: INITIAL    PLAN:  PT FREQUENCY:  2-3x/week   PT DURATION: 8 weeks  PLANNED INTERVENTIONS:  Therapeutic exercises, Therapeutic activity, Neuromuscular re-education, Balance training, Gait training, Patient/Family education, Self Care, Joint mobilization, Aquatic Therapy, Dry Needling, Electrical stimulation, Cryotherapy, Moist heat, Taping, Ultrasound, Ionotophoresis 4mg /ml Dexamethasone, Manual therapy, and Re-evaluation  PLAN FOR NEXT SESSION: per protocol (biceps tendon protocol per op note)   Daleen Bo PT, DPT 08/01/22 9:09 AM

## 2022-08-06 ENCOUNTER — Encounter (HOSPITAL_BASED_OUTPATIENT_CLINIC_OR_DEPARTMENT_OTHER): Payer: Self-pay

## 2022-08-06 ENCOUNTER — Ambulatory Visit (HOSPITAL_BASED_OUTPATIENT_CLINIC_OR_DEPARTMENT_OTHER): Payer: 59

## 2022-08-06 DIAGNOSIS — M25612 Stiffness of left shoulder, not elsewhere classified: Secondary | ICD-10-CM

## 2022-08-06 DIAGNOSIS — G8929 Other chronic pain: Secondary | ICD-10-CM

## 2022-08-06 DIAGNOSIS — M6281 Muscle weakness (generalized): Secondary | ICD-10-CM

## 2022-08-06 NOTE — Therapy (Signed)
OUTPATIENT PHYSICAL THERAPY SHOULDER EVALUATION   Patient Name: Micheal Mckinney MRN: [id number] DOB:[birth year], 52 y.o., male Today's Date: 08/06/2022  END OF SESSION:  PT End of Session - 08/06/22 0945     Visit Number 3    Number of Visits 25    Date for PT Re-Evaluation 09/20/22    Authorization Type UHC    Authorization Time Period 07/25/22 to 10/04/22    PT Start Time 0845    PT Stop Time 0930    PT Time Calculation (min) 45 min    Activity Tolerance Patient tolerated treatment well    Behavior During Therapy [facility] for tasks assessed/performed             Past Medical History:  Diagnosis Date   Allergy    hay fever   Foreign body (FB) in soft tissue    right palm   GERD (gastroesophageal reflux disease)    History of chicken pox    Hyperlipidemia    Hypertension    OSA (obstructive sleep apnea) 01/26/2012   does not use CPAP   Past Surgical History:  Procedure Laterality Date   CARPAL TUNNEL RELEASE Bilateral 05/04/2013   CERVICAL FUSION     Alternative to this done in 05/2017, disc replacement   FOREIGN BODY REMOVAL Right 05/18/2018   Procedure: REMOVAL FOREIGN BODY RIGHT PALM;  Surgeon: Betha Loa, MD;  Location: [facility];  Service: Orthopedics;  Laterality: Right;   IRRIGATION AND DEBRIDEMENT SEBACEOUS CYST  2009    on back and neck   SHOULDER ARTHROSCOPY WITH ROTATOR CUFF REPAIR AND OPEN BICEPS TENODESIS Left 07/23/2022   Procedure: LEFT SHOULDER ARTHROSCOPY WITH ROTATOR CUFF REPAIR AND BICEPS TENODESIS;  Surgeon: Huel Cote, MD;  Location: [facility];  Service: Orthopedics;  Laterality: Left;   Patient Active Problem List   Diagnosis Date Noted   Nontraumatic complete tear of left rotator cuff 07/23/2022   Biceps tendinitis of left upper extremity 07/23/2022   Visit for preventive health examination 10/31/2015   Acute drug-induced gout of right foot 08/15/2015   Hyperlipidemia 05/10/2013   Routine general  medical examination at a health care facility 05/07/2013   Carpal tunnel syndrome 01/12/2013   HTN (hypertension) 01/26/2012   OSA (obstructive sleep apnea) 01/26/2012   Obesity 01/26/2012    PCP: Meredith Staggers MD   REFERRING PROVIDER: Huel Cote, MD  REFERRING DIAG:  [phone] (ICD-10-CM) - Nontraumatic complete tear of left rotator cuff    THERAPY DIAG:  Chronic left shoulder pain  Muscle weakness (generalized)  Stiffness of left shoulder, not elsewhere classified  Rationale for Evaluation and Treatment: Rehabilitation  ONSET DATE:  07/23/22 DOS   Days since surgery: 14  SUBJECTIVE:  SUBJECTIVE STATEMENT:  Pt arrives to PT appt without sling donned. Denies pain. "I wore the sling for the first 6 days. I was told to progress at my own pace. I felt that pace was too slow for me." Pt reports compliance with HEP    Eval: Had surgery 07/23/22 for shoulder scope, RCR, biceps work too. Haven't moved shoulder much I don't know what to do with it. BP was getting a bit high yesterday,  I stopped taking pain meds bc of this yesterday. I'm not feeling well today honestly.   PERTINENT HISTORY: FROM MD NOTE 52 year old male with left shoulder subscapularis tear as well as biceps tendinitis.  At this time he is trialed multiple months of the band strengthening program as well as multiple injections into both shoulders.  He is not getting any persistent relief from his last injection in the left shoulder.  That effect I do believe that he would potentially be a candidate for arthroscopy with subscapularis repair and biceps tenodesis.  We discussed the limitations and restrictions associated with this.  We discussed the complications and possible outcomes associated with this.  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  He understands all of this.  He would like to proceed with left shoulder arthroscopy. Plan :     -Plan for left shoulder arthroscopy with subscapularis repair and biceps tenodesis  POSTOPERATIVE PLAN: He will follow the subscapularis and biceps tendon rehab protocol.  He may begin early active flexion in the scapular plane.  He will avoid active internal rotation or resisted external rotation.  He will follow-up in 2 weeks for suture removal.  He will be placed on aspirin for blood clot prevention.  PAIN:  Are you having pain? Yes: NPRS scale: "0.5/10 Pain location: L shoulder in general  Pain description: dull pain  Aggravating factors: unsure  Relieving factors: unsure   PRECAUTIONS: Other: post op protocol   WEIGHT BEARING RESTRICTIONS: Yes NWB surgical UE   FALLS:  Has patient fallen in last 6 months? 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 Sammuel Hines in about 2 weeks   OBJECTIVE:    PATIENT SURVEYS:  FOTO unavailable; give at next available session  UPPER EXTREMITY ROM:   AROM scaption to 75 deg  Passive ROM Right eval Left eval L 1/18  Shoulder flexion  95* 95  Shoulder extension     Shoulder abduction  Scaption PROM 100* 80  Shoulder adduction     Shoulder internal rotation  Full IR PROM  with elbow at 0* ABD   Full  Shoulder external rotation  Approximately 30* at 0* ABD  neutral  Elbow flexion     Elbow extension     Wrist flexion     Wrist extension     Wrist ulnar  deviation     Wrist radial deviation     Wrist pronation     Wrist supination     (Blank rows = not tested) TODAY'S TREATMENT:  DATE:   1/23  PROM to pt tolerance in flexion, scaption, and ABD; rotation kept to neutral ER, IR full   Exercises - Flexion-Extension Shoulder Pendulum with Table Support x20 - Circular Shoulder Pendulum with Table Support  - x20 - Seated Elbow Flexion AAROM  x20 - Seated Gripping Towel x75min -Seated scapular retraction: 5" hold-2x15 -seated wrist flexion/extension 2# 2x10ea - Standing Shoulder Scaption - 1x10  1/18  PROM to pt tolerance in flexion, scaption, and ABD; rotation kept to neutral ER, IR full   Exercises - Flexion-Extension Shoulder Pendulum with Table Support  - 2-3 x daily - 7 x weekly - 3 sets - 10 reps - Circular Shoulder Pendulum with Table Support  - 2-3 x daily - 7 x weekly - 3 sets - 10 reps - Seated Elbow Flexion AAROM  - 2-3 x daily - 7 x weekly - 2 sets - 10 reps - Supine Shoulder Flexion PROM to 90 Degrees  - 2-3 x daily - 7 x weekly - 3 sets - 10 reps - Seated Gripping Towel  - 2-3 x daily - 7 x weekly - 3 sets - 10 reps - Standing Shoulder Scaption  - 2-3 x daily - 7 x weekly - 1 sets - 10 reps   Eval  Objective measures + appropriate education  BP 160/95 HR 59  Incisions C/D/I, seemed slightly red but no drainage noted with palpation around incisions    PATIENT EDUCATION: Education details: anatomy, exercise progression, DOMS expectations, envelope of function, HEP, POC  Person educated: Patient Education method: Explanation, Demonstration, and Handouts Education comprehension: verbalized understanding, returned demonstration, and needs further education  HOME EXERCISE PROGRAM:   Access Code: [email or web address] URL: [email or web address] Date: 08/01/2022 Prepared by:  Daleen Bo  ASSESSMENT:  CLINICAL IMPRESSION: Pt able to tolerate PROM, AAROM, and AROM today with expected soreness but without increase in pain during session. Educated pt about healing timeline and precautions regarding his recent surgery. Pt strongly advised to continue with sling usage until return to MD and advised on precautions with post-op shoulder care given he is only 2 weeks out. He admitted he is unlikely to return to sling use and feels he is being careful to avoid strenuous positions/movements. Pt will return to MD tomorrow for follow up.   OBJECTIVE IMPAIRMENTS: decreased ROM, decreased strength, hypomobility, increased edema, increased fascial restrictions, increased muscle spasms, impaired flexibility, postural dysfunction, obesity, and pain.   ACTIVITY LIMITATIONS: carrying, lifting, bathing, toileting, dressing, reach over head, hygiene/grooming, and caring for others  PARTICIPATION LIMITATIONS: driving, shopping, community activity, and occupation  PERSONAL FACTORS: Behavior pattern, Education, Fitness, and Past/current experiences are also affecting patient's functional outcome.   REHAB POTENTIAL: Good  CLINICAL DECISION MAKING: Stable/uncomplicated  EVALUATION COMPLEXITY: Low   GOALS: Goals reviewed with patient? Yes  SHORT TERM GOALS: Target date: 08/23/2022    Will be compliant with appropriate progressive HEP as appropriate post-op Baseline: Goal status: INITIAL  2.  Surgical shoulder AROM and PROM to be full without increased pain  Baseline:  Goal status: INITIAL  3.  Pain in surgical shoulder to be no more than 2/10 with functional activities in PT and HEP  Baseline:  Goal status: INITIAL  4.  Will have better understanding of postural liimitations and general ergonomics  Baseline:  Goal status: INITIAL    LONG TERM GOALS: Target date: 09/20/2022    MMT in surgical shoulder to be at least 4/5 in all tested groups  Baseline:  Goal status:  INITIAL  2.  Will have been able to return to work on limited duty basis as deemed appropriate by MD without increase in pain  Baseline:  Goal status: INITIAL  3.  Will be able to reach overhead to pull a fan or light switch with surgical UE without increase in pain Baseline:  Goal status: INITIAL  4.  Pain in surgical shoulder with functional activities at home and work to be no more than 2/10 Baseline:  Goal status: INITIAL    PLAN:  PT FREQUENCY:  2-3x/week   PT DURATION: 8 weeks  PLANNED INTERVENTIONS: Therapeutic exercises, Therapeutic activity, Neuromuscular re-education, Balance training, Gait training, Patient/Family education, Self Care, Joint mobilization, Aquatic Therapy, Dry Needling, Electrical stimulation, Cryotherapy, Moist heat, Taping, Ultrasound, Ionotophoresis 4mg /ml Dexamethasone, Manual therapy, and Re-evaluation  PLAN FOR NEXT SESSION: per protocol (biceps tendon protocol per op note)   , PTA  08/06/22 9:52 AM

## 2022-08-07 ENCOUNTER — Ambulatory Visit (INDEPENDENT_AMBULATORY_CARE_PROVIDER_SITE_OTHER): Payer: 59 | Admitting: Orthopaedic Surgery

## 2022-08-07 DIAGNOSIS — M75122 Complete rotator cuff tear or rupture of left shoulder, not specified as traumatic: Secondary | ICD-10-CM

## 2022-08-07 NOTE — Progress Notes (Signed)
Post Operative Evaluation    Procedure/Date of Surgery: Left shoulder arthroscopy with rotator cuff debridement biceps tenodesis 07/23/22  Interval History:    Presents 2 weeks status post the procedure.  Overall he is doing very well.  His pain is very well-controlled.  He has been working on range of motion.  He has begun physical therapy.  He has been compliant with medication usage   PMH/PSH/Family History/Social History/Meds/Allergies:    Past Medical History:  Diagnosis Date   Allergy    hay fever   Foreign body (FB) in soft tissue    right palm   GERD (gastroesophageal reflux disease)    History of chicken pox    Hyperlipidemia    Hypertension    OSA (obstructive sleep apnea) 01/26/2012   does not use CPAP   Past Surgical History:  Procedure Laterality Date   CARPAL TUNNEL RELEASE Bilateral 05/04/2013   CERVICAL FUSION     Alternative to this done in 05/2017, disc replacement   FOREIGN BODY REMOVAL Right 05/18/2018   Procedure: REMOVAL FOREIGN BODY RIGHT PALM;  Surgeon: Leanora Cover, MD;  Location: [place];  Service: Orthopedics;  Laterality: Right;   IRRIGATION AND DEBRIDEMENT SEBACEOUS CYST  2009    on back and neck   SHOULDER ARTHROSCOPY WITH ROTATOR CUFF REPAIR AND OPEN BICEPS TENODESIS Left 07/23/2022   Procedure: LEFT SHOULDER ARTHROSCOPY WITH ROTATOR CUFF REPAIR AND BICEPS TENODESIS;  Surgeon: Vanetta Mulders, MD;  Location: [place];  Service: Orthopedics;  Laterality: Left;   Social History   Socioeconomic History   Marital status: Married    Spouse name: Not on file   Number of children: Not on file   Years of education: Not on file   Highest education level: Not on file  Occupational History   Not on file  Tobacco Use   Smoking status: Former    Packs/day: 0.25    Years: 25.00    Total pack years: 6.25    Types: Cigarettes    Quit date: 01/16/2020    Years since quitting: 2.5    Smokeless tobacco: Never   Tobacco comments:    Chewing tobacco  Vaping Use   Vaping Use: Never used  Substance and Sexual Activity   Alcohol use: No    Alcohol/week: 0.0 standard drinks of alcohol   Drug use: No   Sexual activity: Yes  Other Topics Concern   Not on file  Social History Narrative   Lives with wife and dogs   Social Determinants of Health   Financial Resource Strain: Not on file  Food Insecurity: Not on file  Transportation Needs: Not on file  Physical Activity: Not on file  Stress: Not on file  Social Connections: Not on file   Family History  Problem Relation Age of Onset   Cancer Mother        breast   Stroke Father    Allergies  Allergen Reactions   Chlorhexidine Dermatitis   Current Outpatient Medications  Medication Sig Dispense Refill   aspirin EC 325 MG tablet Take 1 tablet (325 mg total) by mouth daily. 30 tablet 0   atenolol (TENORMIN) 50 MG tablet Take 1 tablet (50 mg total) by mouth daily. 90 tablet 3   cyclobenzaprine (FLEXERIL) 5 MG tablet Take 1 tablet (  5 mg total) by mouth 3 (three) times daily as needed for muscle spasms (start qhs prn due to sedation). (Patient not taking: Reported on 03/21/2022) 15 tablet 1   ezetimibe (ZETIA) 10 MG tablet Take 1 tablet (10 mg total) by mouth daily. 90 tablet 3   methylPREDNISolone (MEDROL DOSEPAK) 4 MG TBPK tablet Take per packet instructions (Patient not taking: Reported on 03/21/2022) 21 tablet 0   oxyCODONE (OXY IR/ROXICODONE) 5 MG immediate release tablet Take 1 tablet (5 mg total) by mouth every 4 (four) hours as needed (severe pain). 10 tablet 0   ranitidine (ZANTAC) 150 MG tablet Take 1 tablet by mouth as needed.     rosuvastatin (CRESTOR) 5 MG tablet Take 1 tablet (5 mg total) by mouth daily. 90 tablet 3   telmisartan (MICARDIS) 40 MG tablet Take 1 tablet (40 mg total) by mouth daily. 90 tablet 3   No current facility-administered medications for this visit.   No results found.  Review of  Systems:   A ROS was performed including pertinent positives and negatives as documented in the HPI.   Musculoskeletal Exam:    There were no vitals taken for this visit.  Left shoulder incisions are well healing without erythema or drainage.  In the supine position is able to forward elevate to 120 degrees actively with external rotation at side to 40 degrees.  Internal rotation deferred today.  Remainder of distal neurosensory exam is intact  Imaging:      I personally reviewed and interpreted the radiographs.   Assessment:   2 weeks status post left shoulder rotator cuff debridement and biceps tenodesis overall doing extremely well.  At this time he will begin active overhead range of motion.  I have asked that he not begin any lifting.  He may return to light duty work at this time.  I will plan to see him back in 4 weeks for reassessment  Plan :    -Return to clinic in 4 weeks for reassessment      I personally saw and evaluated the patient, and participated in the management and treatment plan.  Vanetta Mulders, MD Attending Physician, Orthopedic Surgery  This document was dictated using Dragon voice recognition software. A reasonable attempt at proof reading has been made to minimize errors.

## 2022-08-08 ENCOUNTER — Encounter (HOSPITAL_BASED_OUTPATIENT_CLINIC_OR_DEPARTMENT_OTHER): Payer: 59 | Admitting: Physical Therapy

## 2022-08-12 ENCOUNTER — Ambulatory Visit (HOSPITAL_BASED_OUTPATIENT_CLINIC_OR_DEPARTMENT_OTHER): Payer: 59

## 2022-08-14 ENCOUNTER — Encounter (HOSPITAL_BASED_OUTPATIENT_CLINIC_OR_DEPARTMENT_OTHER): Payer: 59 | Admitting: Physical Therapy

## 2022-08-16 ENCOUNTER — Encounter: Payer: 59 | Admitting: Family Medicine

## 2022-08-19 ENCOUNTER — Encounter (HOSPITAL_BASED_OUTPATIENT_CLINIC_OR_DEPARTMENT_OTHER): Payer: Self-pay

## 2022-08-19 ENCOUNTER — Ambulatory Visit (HOSPITAL_BASED_OUTPATIENT_CLINIC_OR_DEPARTMENT_OTHER): Payer: 59 | Attending: Orthopaedic Surgery

## 2022-08-19 DIAGNOSIS — M6281 Muscle weakness (generalized): Secondary | ICD-10-CM | POA: Insufficient documentation

## 2022-08-19 DIAGNOSIS — G8929 Other chronic pain: Secondary | ICD-10-CM | POA: Insufficient documentation

## 2022-08-19 DIAGNOSIS — M25612 Stiffness of left shoulder, not elsewhere classified: Secondary | ICD-10-CM | POA: Diagnosis present

## 2022-08-19 DIAGNOSIS — M25512 Pain in left shoulder: Secondary | ICD-10-CM | POA: Insufficient documentation

## 2022-08-19 NOTE — Therapy (Signed)
OUTPATIENT PHYSICAL THERAPY SHOULDER EVALUATION   Patient Name: Micheal Mckinney MRN: [id number] DOB:November 10, 1970, 52 y.o., male Today's Date: 08/19/2022  END OF SESSION:  PT End of Session - 08/19/22 0927     Visit Number 4    Number of Visits 25    Date for PT Re-Evaluation 09/20/22    Authorization Type [employer]    Authorization Time Period 07/25/22 to 10/04/22    PT Start Time 0845    PT Stop Time 0920    PT Time Calculation (min) 35 min    Activity Tolerance Patient tolerated treatment well    Behavior During Therapy [facility] for tasks assessed/performed              Past Medical History:  Diagnosis Date   Allergy    hay fever   Foreign body (FB) in soft tissue    right palm   GERD (gastroesophageal reflux disease)    History of chicken pox    Hyperlipidemia    Hypertension    OSA (obstructive sleep apnea) 01/26/2012   does not use CPAP   Past Surgical History:  Procedure Laterality Date   CARPAL TUNNEL RELEASE Bilateral 05/04/2013   CERVICAL FUSION     Alternative to this done in 05/2017, disc replacement   FOREIGN BODY REMOVAL Right 05/18/2018   Procedure: REMOVAL FOREIGN BODY RIGHT PALM;  Surgeon: Leanora Cover, MD;  Location: [place];  Service: Orthopedics;  Laterality: Right;   IRRIGATION AND DEBRIDEMENT SEBACEOUS CYST  2009    on back and neck   SHOULDER ARTHROSCOPY WITH ROTATOR CUFF REPAIR AND OPEN BICEPS TENODESIS Left 07/23/2022   Procedure: LEFT SHOULDER ARTHROSCOPY WITH ROTATOR CUFF REPAIR AND BICEPS TENODESIS;  Surgeon: Vanetta Mulders, MD;  Location: [place];  Service: Orthopedics;  Laterality: Left;   Patient Active Problem List   Diagnosis Date Noted   Nontraumatic complete tear of left rotator cuff 07/23/2022   Biceps tendinitis of left upper extremity 07/23/2022   Visit for preventive health examination 10/31/2015   Acute drug-induced gout of right foot 08/15/2015   Hyperlipidemia 05/10/2013   Routine general  medical examination at a health care facility 05/07/2013   Carpal tunnel syndrome 01/12/2013   HTN (hypertension) 01/26/2012   OSA (obstructive sleep apnea) 01/26/2012   Obesity 01/26/2012    PCP: Merri Ray MD   REFERRING PROVIDER: Vanetta Mulders, MD  REFERRING DIAG:  [phone] (ICD-10-CM) - Nontraumatic complete tear of left rotator cuff    THERAPY DIAG:  Chronic left shoulder pain  Muscle weakness (generalized)  Stiffness of left shoulder, not elsewhere classified  Rationale for Evaluation and Treatment: Rehabilitation  ONSET DATE:  07/23/22 DOS   Days since surgery: 27  SUBJECTIVE:  SUBJECTIVE STATEMENT:  Pt reports increase in pain recently. "I am unable to raise my arm due to pain. " Pt reports he was doing well until he moved his arm one time and felt severe pain. He went to plug in an extension cord yesterday and was unable to do so due to pain. Also reports some sensitivity to touch on top of head that started about a week and a half ago. Unable to sleep well since his increase in pain.   Eval: Had surgery 07/23/22 for shoulder scope, RCR, biceps work too. Haven't moved shoulder much I don't know what to do with it. BP was getting a bit high yesterday,  I stopped taking pain meds bc of this yesterday. I'm not feeling well today honestly.   PERTINENT HISTORY: FROM MD NOTE 52 year old male with left shoulder subscapularis tear as well as biceps tendinitis.  At this time he is trialed multiple months of the band strengthening program as well as multiple injections into both shoulders.  He is not getting any persistent relief from his last injection in the left shoulder.  That effect I do believe that he would potentially be a candidate for arthroscopy with subscapularis repair and biceps  tenodesis.  We discussed the limitations and restrictions associated with this.  We discussed the complications and possible outcomes associated with this.  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  He understands all of this.  He would like to proceed with left shoulder arthroscopy. Plan :     -Plan for left shoulder arthroscopy with subscapularis repair and biceps tenodesis  POSTOPERATIVE PLAN: He will follow the subscapularis and biceps tendon rehab protocol.  He may begin early active flexion in the scapular plane.  He will avoid active internal rotation or resisted external rotation.  He will follow-up in 2 weeks for suture removal.  He will be placed on aspirin for blood clot prevention.  PAIN:  Are you having pain? Yes: NPRS scale: "0.5/10 Pain location: L shoulder in general  Pain description: dull pain  Aggravating factors: unsure  Relieving factors: unsure   PRECAUTIONS: Other: post op protocol   WEIGHT BEARING RESTRICTIONS: Yes NWB surgical UE   FALLS:  Has patient fallen in last 6 months? 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 Steward Drone in about 2 weeks   OBJECTIVE:    PATIENT SURVEYS:  FOTO unavailable; give at next available session  UPPER EXTREMITY ROM:   AROM scaption to 75 deg  Passive ROM Right eval Left eval L 1/18  Shoulder flexion  95* 95  Shoulder extension     Shoulder abduction  Scaption PROM 100* 80  Shoulder adduction     Shoulder internal rotation  Full IR PROM  with  elbow at 0* ABD   Full  Shoulder external rotation  Approximately 30* at 0* ABD  neutral  Elbow flexion     Elbow extension     Wrist flexion     Wrist extension     Wrist ulnar deviation  Wrist radial deviation     Wrist pronation     Wrist supination     (Blank rows = not tested) TODAY'S TREATMENT:                                                                                                                                         DATE:   2/5  PROM to pt tolerance   Exercises - Flexion-Extension Shoulder Pendulum with Table Support x20 - Circular Shoulder Pendulum with Table Support  - x20 - Seated Elbow Flexion AROM  x20 -Seated scapular retraction: 5" hold-2x15  1/23  PROM to pt tolerance in flexion, scaption, and ABD; rotation kept to neutral ER, IR full   Exercises - Flexion-Extension Shoulder Pendulum with Table Support x20 - Circular Shoulder Pendulum with Table Support  - x20 - Seated Elbow Flexion AAROM  x20 - Seated Gripping Towel x82min -Seated scapular retraction: 5" hold-2x15 -seated wrist flexion/extension 2# 2x10ea - Standing Shoulder Scaption - 1x10  1/18  PROM to pt tolerance in flexion, scaption, and ABD; rotation kept to neutral ER, IR full   Exercises - Flexion-Extension Shoulder Pendulum with Table Support  - 2-3 x daily - 7 x weekly - 3 sets - 10 reps - Circular Shoulder Pendulum with Table Support  - 2-3 x daily - 7 x weekly - 3 sets - 10 reps - Seated Elbow Flexion AAROM  - 2-3 x daily - 7 x weekly - 2 sets - 10 reps - Supine Shoulder Flexion PROM to 90 Degrees  - 2-3 x daily - 7 x weekly - 3 sets - 10 reps - Seated Gripping Towel  - 2-3 x daily - 7 x weekly - 3 sets - 10 reps - Standing Shoulder Scaption  - 2-3 x daily - 7 x weekly - 1 sets - 10 reps   Eval  Objective measures + appropriate education  BP 160/95 HR 59  Incisions C/D/I, seemed slightly red but no drainage noted with palpation around incisions    PATIENT  EDUCATION: Education details: anatomy, exercise progression, DOMS expectations, envelope of function, HEP, POC  Person educated: Patient Education method: Explanation, Demonstration, and Handouts Education comprehension: verbalized understanding, returned demonstration, and needs further education  HOME EXERCISE PROGRAM:   Access Code: [email or web address] URL: [email or web address] Date: 08/01/2022 Prepared by: Daleen Bo  ASSESSMENT:  CLINICAL IMPRESSION: Pt educated about healing timeline and activity limitation. Instructed on sx management using ice and sling. Advised pt to increase frequency of PT to which he agreed. Able to get in him in this Friday so we can monitor his pain level. Instructed pt to notify MD if pain worsens significantly between now and then. Gentle PROM performed today with gentle scapular retractions, pendulums, and elbow AROM. No c/o significant pain with exercise or PROM.   OBJECTIVE IMPAIRMENTS: decreased ROM, decreased strength, hypomobility, increased edema, increased fascial restrictions, increased muscle spasms, impaired  flexibility, postural dysfunction, obesity, and pain.   ACTIVITY LIMITATIONS: carrying, lifting, bathing, toileting, dressing, reach over head, hygiene/grooming, and caring for others  PARTICIPATION LIMITATIONS: driving, shopping, community activity, and occupation  PERSONAL FACTORS: Behavior pattern, Education, Fitness, and Past/current experiences are also affecting patient's functional outcome.   REHAB POTENTIAL: Good  CLINICAL DECISION MAKING: Stable/uncomplicated  EVALUATION COMPLEXITY: Low   GOALS: Goals reviewed with patient? Yes  SHORT TERM GOALS: Target date: 08/23/2022    Will be compliant with appropriate progressive HEP as appropriate post-op Baseline: Goal status: MET 08/19/22  2.  Surgical shoulder AROM and PROM to be full without increased pain  Baseline:  Goal status: INITIAL  3.  Pain in surgical shoulder  to be no more than 2/10 with functional activities in PT and HEP  Baseline:  Goal status: INITIAL  4.  Will have better understanding of postural liimitations and general ergonomics  Baseline:  Goal status: INITIAL    LONG TERM GOALS: Target date: 09/20/2022    MMT in surgical shoulder to be at least 4/5 in all tested groups  Baseline:  Goal status: INITIAL  2.  Will have been able to return to work on limited duty basis as deemed appropriate by MD without increase in pain  Baseline:  Goal status: INITIAL  3.  Will be able to reach overhead to pull a fan or light switch with surgical UE without increase in pain Baseline:  Goal status: INITIAL  4.  Pain in surgical shoulder with functional activities at home and work to be no more than 2/10 Baseline:  Goal status: INITIAL    PLAN:  PT FREQUENCY:  2-3x/week   PT DURATION: 8 weeks  PLANNED INTERVENTIONS: Therapeutic exercises, Therapeutic activity, Neuromuscular re-education, Balance training, Gait training, Patient/Family education, Self Care, Joint mobilization, Aquatic Therapy, Dry Needling, Electrical stimulation, Cryotherapy, Moist heat, Taping, Ultrasound, Ionotophoresis 4mg /ml Dexamethasone, Manual therapy, and Re-evaluation  PLAN FOR NEXT SESSION: per protocol (biceps tendon protocol per op note)   Sherlynn Carbon, PTA  08/19/22 10:08 AM

## 2022-08-21 ENCOUNTER — Encounter (HOSPITAL_BASED_OUTPATIENT_CLINIC_OR_DEPARTMENT_OTHER): Payer: 59

## 2022-08-23 ENCOUNTER — Encounter (HOSPITAL_BASED_OUTPATIENT_CLINIC_OR_DEPARTMENT_OTHER): Payer: Self-pay

## 2022-08-23 ENCOUNTER — Ambulatory Visit (HOSPITAL_BASED_OUTPATIENT_CLINIC_OR_DEPARTMENT_OTHER): Payer: 59

## 2022-08-23 ENCOUNTER — Encounter (HOSPITAL_BASED_OUTPATIENT_CLINIC_OR_DEPARTMENT_OTHER): Payer: 59

## 2022-08-23 DIAGNOSIS — G8929 Other chronic pain: Secondary | ICD-10-CM

## 2022-08-23 DIAGNOSIS — M25512 Pain in left shoulder: Secondary | ICD-10-CM | POA: Diagnosis not present

## 2022-08-23 DIAGNOSIS — M6281 Muscle weakness (generalized): Secondary | ICD-10-CM

## 2022-08-23 DIAGNOSIS — M25612 Stiffness of left shoulder, not elsewhere classified: Secondary | ICD-10-CM

## 2022-08-23 NOTE — Therapy (Signed)
OUTPATIENT PHYSICAL THERAPY SHOULDER EVALUATION   Patient Name: Micheal Mckinney MRN: [id number] DOB:20-Mar-1971, 52 y.o., male Today's Date: 08/23/2022  END OF SESSION:  PT End of Session - 08/23/22 1600     Visit Number 5    Number of Visits 25    Date for PT Re-Evaluation 09/20/22    Authorization Type U[employer]    Authorization Time Period 07/25/22 to 10/04/22    PT Start Time 1600    PT Stop Time 1645    PT Time Calculation (min) 45 min    Activity Tolerance Patient tolerated treatment well    Behavior During Therapy [facility] for tasks assessed/performed               Past Medical History:  Diagnosis Date   Allergy    hay fever   Foreign body (FB) in soft tissue    right palm   GERD (gastroesophageal reflux disease)    History of chicken pox    Hyperlipidemia    Hypertension    OSA (obstructive sleep apnea) 01/26/2012   does not use CPAP   Past Surgical History:  Procedure Laterality Date   CARPAL TUNNEL RELEASE Bilateral 05/04/2013   CERVICAL FUSION     Alternative to this done in 05/2017, disc replacement   FOREIGN BODY REMOVAL Right 05/18/2018   Procedure: REMOVAL FOREIGN BODY RIGHT PALM;  Surgeon: Leanora Cover, MD;  Location: [place];  Service: Orthopedics;  Laterality: Right;   IRRIGATION AND DEBRIDEMENT SEBACEOUS CYST  2009    on back and neck   SHOULDER ARTHROSCOPY WITH ROTATOR CUFF REPAIR AND OPEN BICEPS TENODESIS Left 07/23/2022   Procedure: LEFT SHOULDER ARTHROSCOPY WITH ROTATOR CUFF REPAIR AND BICEPS TENODESIS;  Surgeon: Vanetta Mulders, MD;  Location: [place];  Service: Orthopedics;  Laterality: Left;   Patient Active Problem List   Diagnosis Date Noted   Nontraumatic complete tear of left rotator cuff 07/23/2022   Biceps tendinitis of left upper extremity 07/23/2022   Visit for preventive health examination 10/31/2015   Acute drug-induced gout of right foot 08/15/2015   Hyperlipidemia 05/10/2013   Routine general  medical examination at a health care facility 05/07/2013   Carpal tunnel syndrome 01/12/2013   HTN (hypertension) 01/26/2012   OSA (obstructive sleep apnea) 01/26/2012   Obesity 01/26/2012    PCP: Merri Ray MD   REFERRING PROVIDER: Vanetta Mulders, MD  REFERRING DIAG:  [phone] (ICD-10-CM) - Nontraumatic complete tear of left rotator cuff    THERAPY DIAG:  Chronic left shoulder pain  Muscle weakness (generalized)  Stiffness of left shoulder, not elsewhere classified  Rationale for Evaluation and Treatment: Rehabilitation  ONSET DATE:  07/23/22 DOS   Days since surgery: 31  SUBJECTIVE:  SUBJECTIVE STATEMENT:   Pt reports  he has been taking it easy and not done much with his arm. "It feels better than last time, but not as good as the first visit."   Eval: Had surgery 07/23/22 for shoulder scope, RCR, biceps work too. Haven't moved shoulder much I don't know what to do with it. BP was getting a bit high yesterday,  I stopped taking pain meds bc of this yesterday. I'm not feeling well today honestly.   PERTINENT HISTORY: FROM MD NOTE 52 year old male with left shoulder subscapularis tear as well as biceps tendinitis.  At this time he is trialed multiple months of the band strengthening program as well as multiple injections into both shoulders.  He is not getting any persistent relief from his last injection in the left shoulder.  That effect I do believe that he would potentially be a candidate for arthroscopy with subscapularis repair and biceps tenodesis.  We discussed the limitations and restrictions associated with this.  We discussed the complications and possible outcomes associated with this.  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  He  understands all of this.  He would like to proceed with left shoulder arthroscopy. Plan :     -Plan for left shoulder arthroscopy with subscapularis repair and biceps tenodesis  POSTOPERATIVE PLAN: He will follow the subscapularis and biceps tendon rehab protocol.  He may begin early active flexion in the scapular plane.  He will avoid active internal rotation or resisted external rotation.  He will follow-up in 2 weeks for suture removal.  He will be placed on aspirin for blood clot prevention.  PAIN:  Are you having pain? Yes: NPRS scale: "0.5/10 Pain location: L shoulder in general  Pain description: dull pain  Aggravating factors: unsure  Relieving factors: unsure   PRECAUTIONS: Other: post op protocol   WEIGHT BEARING RESTRICTIONS: Yes NWB surgical UE   FALLS:  Has patient fallen in last 6 months? 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 Sammuel Hines in about 2 weeks   OBJECTIVE:    PATIENT SURVEYS:  FOTO unavailable; give at next available session  UPPER EXTREMITY ROM:   AROM scaption to 75 deg  Passive ROM Right eval Left eval L 1/18  Shoulder flexion  95* 95  Shoulder extension     Shoulder abduction  Scaption PROM 100* 80  Shoulder adduction     Shoulder internal rotation  Full IR PROM  with elbow at 0* ABD   Full  Shoulder external rotation  Approximately 30* at 0* ABD  neutral  Elbow flexion     Elbow extension     Wrist flexion     Wrist extension     Wrist ulnar deviation     Wrist radial deviation     Wrist pronation     Wrist  supination     (Blank rows = not tested) TODAY'S TREATMENT:  DATE:   2/9  PROM to pt tolerance STM to L UT and levator    Exercises - Flexion-Extension Shoulder Pendulum with Table Support x20 - Circular Shoulder Pendulum with Table Support  - x20 - Seated Elbow Flexion AROM  x30 -Seated scapular retraction: 5" hold-2x15 -Seated table slides flexion and abduction x10 ea  2/5  PROM to pt tolerance   Exercises - Flexion-Extension Shoulder Pendulum with Table Support x20 - Circular Shoulder Pendulum with Table Support  - x20 - Seated Elbow Flexion AROM  x20 -Seated scapular retraction: 5" hold-2x15  1/23  PROM to pt tolerance in flexion, scaption, and ABD; rotation kept to neutral ER, IR full   Exercises - Flexion-Extension Shoulder Pendulum with Table Support x20 - Circular Shoulder Pendulum with Table Support  - x20 - Seated Elbow Flexion AAROM  x20 - Seated Gripping Towel x6mn -Seated scapular retraction: 5" hold-2x15 -seated wrist flexion/extension 2# 2x10ea - Standing Shoulder Scaption - 1x10  1/18  PROM to pt tolerance in flexion, scaption, and ABD; rotation kept to neutral ER, IR full   Exercises - Flexion-Extension Shoulder Pendulum with Table Support  - 2-3 x daily - 7 x weekly - 3 sets - 10 reps - Circular Shoulder Pendulum with Table Support  - 2-3 x daily - 7 x weekly - 3 sets - 10 reps - Seated Elbow Flexion AAROM  - 2-3 x daily - 7 x weekly - 2 sets - 10 reps - Supine Shoulder Flexion PROM to 90 Degrees  - 2-3 x daily - 7 x weekly - 3 sets - 10 reps - Seated Gripping Towel  - 2-3 x daily - 7 x weekly - 3 sets - 10 reps - Standing Shoulder Scaption  - 2-3 x daily - 7 x weekly - 1 sets - 10 reps   Eval  Objective measures + appropriate education  BP 160/95 HR 59  Incisions C/D/I, seemed slightly red but no  drainage noted with palpation around incisions    PATIENT EDUCATION: Education details: anatomy, exercise progression, DOMS expectations, envelope of function, HEP, POC  Person educated: Patient Education method: Explanation, Demonstration, and Handouts Education comprehension: verbalized understanding, returned demonstration, and needs further education  HOME EXERCISE PROGRAM:   Access Code: [email or web address] URL: [email or web address] Date: 08/01/2022 Prepared by: ADaleen Bo ASSESSMENT:  CLINICAL IMPRESSION: Continued to be gentle with PROM and therex. Worked on [employer] TPR to L UT and levator scap. To address triggerpoints and restrcitions here.  No complaints of pain with passive range of motion in each plane.  Trialed seated table slides in flexion and abduction with cues to remain in pain limitations.  Educated patient on importance of adhering to precautions and remaining within pain limitations.  Patient has follow-up with MD on Thursday next week.  OBJECTIVE IMPAIRMENTS: decreased ROM, decreased strength, hypomobility, increased edema, increased fascial restrictions, increased muscle spasms, impaired flexibility, postural dysfunction, obesity, and pain.   ACTIVITY LIMITATIONS: carrying, lifting, bathing, toileting, dressing, reach over head, hygiene/grooming, and caring for others  PARTICIPATION LIMITATIONS: driving, shopping, community activity, and occupation  PERSONAL FACTORS: Behavior pattern, Education, Fitness, and Past/current experiences are also affecting patient's functional outcome.   REHAB POTENTIAL: Good  CLINICAL DECISION MAKING: Stable/uncomplicated  EVALUATION COMPLEXITY: Low   GOALS: Goals reviewed with patient? Yes  SHORT TERM GOALS: Target date: 08/23/2022    Will be compliant with appropriate progressive HEP as appropriate post-op Baseline: Goal status: MET 08/19/22  2.  Surgical shoulder AROM and PROM to be  full without increased pain   Baseline:  Goal status: INITIAL  3.  Pain in surgical shoulder to be no more than 2/10 with functional activities in PT and HEP  Baseline:  Goal status: INITIAL  4.  Will have better understanding of postural liimitations and general ergonomics  Baseline:  Goal status: INITIAL    LONG TERM GOALS: Target date: 09/20/2022    MMT in surgical shoulder to be at least 4/5 in all tested groups  Baseline:  Goal status: INITIAL  2.  Will have been able to return to work on limited duty basis as deemed appropriate by MD without increase in pain  Baseline:  Goal status: INITIAL  3.  Will be able to reach overhead to pull a fan or light switch with surgical UE without increase in pain Baseline:  Goal status: INITIAL  4.  Pain in surgical shoulder with functional activities at home and work to be no more than 2/10 Baseline:  Goal status: INITIAL    PLAN:  PT FREQUENCY:  2-3x/week   PT DURATION: 8 weeks  PLANNED INTERVENTIONS: Therapeutic exercises, Therapeutic activity, Neuromuscular re-education, Balance training, Gait training, Patient/Family education, Self Care, Joint mobilization, Aquatic Therapy, Dry Needling, Electrical stimulation, Cryotherapy, Moist heat, Taping, Ultrasound, Ionotophoresis 42m/ml Dexamethasone, Manual therapy, and Re-evaluation  PLAN FOR NEXT SESSION: per protocol (biceps tendon protocol per op note)   RSherlynn Carbon PTA  08/23/22 4:51 PM

## 2022-08-27 ENCOUNTER — Encounter (HOSPITAL_BASED_OUTPATIENT_CLINIC_OR_DEPARTMENT_OTHER): Payer: 59

## 2022-08-29 ENCOUNTER — Ambulatory Visit (HOSPITAL_BASED_OUTPATIENT_CLINIC_OR_DEPARTMENT_OTHER): Payer: 59

## 2022-08-29 ENCOUNTER — Encounter (HOSPITAL_BASED_OUTPATIENT_CLINIC_OR_DEPARTMENT_OTHER): Payer: 59

## 2022-08-29 ENCOUNTER — Encounter (HOSPITAL_BASED_OUTPATIENT_CLINIC_OR_DEPARTMENT_OTHER): Payer: Self-pay

## 2022-08-29 DIAGNOSIS — M6281 Muscle weakness (generalized): Secondary | ICD-10-CM

## 2022-08-29 DIAGNOSIS — M25512 Pain in left shoulder: Secondary | ICD-10-CM | POA: Diagnosis not present

## 2022-08-29 DIAGNOSIS — M25612 Stiffness of left shoulder, not elsewhere classified: Secondary | ICD-10-CM

## 2022-08-29 DIAGNOSIS — G8929 Other chronic pain: Secondary | ICD-10-CM

## 2022-08-29 NOTE — Therapy (Signed)
OUTPATIENT PHYSICAL THERAPY SHOULDER EVALUATION   Patient Name: Micheal Mckinney MRN: [id number] DOB:04-Apr-1971, 52 y.o., male Today's Date: 08/29/2022  END OF SESSION:  PT End of Session - 08/29/22 0752     Visit Number 6    Number of Visits 25    Date for PT Re-Evaluation 09/20/22    Authorization Type UHC    Authorization Time Period 07/25/22 to 10/04/22    PT Start Time 0800    PT Stop Time 0841    PT Time Calculation (min) 41 min    Activity Tolerance Patient tolerated treatment well    Behavior During Therapy [facility] for tasks assessed/performed               Past Medical History:  Diagnosis Date   Allergy    hay fever   Foreign body (FB) in soft tissue    right palm   GERD (gastroesophageal reflux disease)    History of chicken pox    Hyperlipidemia    Hypertension    OSA (obstructive sleep apnea) 01/26/2012   does not use CPAP   Past Surgical History:  Procedure Laterality Date   CARPAL TUNNEL RELEASE Bilateral 05/04/2013   CERVICAL FUSION     Alternative to this done in 05/2017, disc replacement   FOREIGN BODY REMOVAL Right 05/18/2018   Procedure: REMOVAL FOREIGN BODY RIGHT PALM;  Surgeon: Leanora Cover, MD;  Location: [place];  Service: Orthopedics;  Laterality: Right;   IRRIGATION AND DEBRIDEMENT SEBACEOUS CYST  2009    on back and neck   SHOULDER ARTHROSCOPY WITH ROTATOR CUFF REPAIR AND OPEN BICEPS TENODESIS Left 07/23/2022   Procedure: LEFT SHOULDER ARTHROSCOPY WITH ROTATOR CUFF REPAIR AND BICEPS TENODESIS;  Surgeon: Vanetta Mulders, MD;  Location: [place];  Service: Orthopedics;  Laterality: Left;   Patient Active Problem List   Diagnosis Date Noted   Nontraumatic complete tear of left rotator cuff 07/23/2022   Biceps tendinitis of left upper extremity 07/23/2022   Visit for preventive health examination 10/31/2015   Acute drug-induced gout of right foot 08/15/2015   Hyperlipidemia 05/10/2013   Routine general  medical examination at a health care facility 05/07/2013   Carpal tunnel syndrome 01/12/2013   HTN (hypertension) 01/26/2012   OSA (obstructive sleep apnea) 01/26/2012   Obesity 01/26/2012    PCP: Merri Ray MD   REFERRING PROVIDER: Vanetta Mulders, MD  REFERRING DIAG:  [phone] (ICD-10-CM) - Nontraumatic complete tear of left rotator cuff    THERAPY DIAG:  Chronic left shoulder pain  Muscle weakness (generalized)  Stiffness of left shoulder, not elsewhere classified  Rationale for Evaluation and Treatment: Rehabilitation  ONSET DATE:  07/23/22 DOS   Days since surgery: 37  SUBJECTIVE:  SUBJECTIVE STATEMENT:   Pt reports  he continues to have difficulty sleeping due to shoulder soreness. Reports very mild improvement in pain since last visit.   Eval: Had surgery 07/23/22 for shoulder scope, RCR, biceps work too. Haven't moved shoulder much I don't know what to do with it. BP was getting a bit high yesterday,  I stopped taking pain meds bc of this yesterday. I'm not feeling well today honestly.   PERTINENT HISTORY: FROM MD NOTE 52 year old male with left shoulder subscapularis tear as well as biceps tendinitis.  At this time he is trialed multiple months of the band strengthening program as well as multiple injections into both shoulders.  He is not getting any persistent relief from his last injection in the left shoulder.  That effect I do believe that he would potentially be a candidate for arthroscopy with subscapularis repair and biceps tenodesis.  We discussed the limitations and restrictions associated with this.  We discussed the complications and possible outcomes associated with this.  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  He understands all  of this.  He would like to proceed with left shoulder arthroscopy. Plan :     -Plan for left shoulder arthroscopy with subscapularis repair and biceps tenodesis  POSTOPERATIVE PLAN: He will follow the subscapularis and biceps tendon rehab protocol.  He may begin early active flexion in the scapular plane.  He will avoid active internal rotation or resisted external rotation.  He will follow-up in 2 weeks for suture removal.  He will be placed on aspirin for blood clot prevention.  PAIN:  Are you having pain? Yes: NPRS scale: "0.5/10 Pain location: L shoulder in general  Pain description: dull pain  Aggravating factors: unsure  Relieving factors: unsure   PRECAUTIONS: Other: post op protocol   WEIGHT BEARING RESTRICTIONS: Yes NWB surgical UE   FALLS:  Has patient fallen in last 6 months? 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 Sammuel Hines in about 2 weeks   OBJECTIVE:    PATIENT SURVEYS:  FOTO unavailable; give at next available session  UPPER EXTREMITY ROM:   AROM scaption to 75 deg  Passive ROM Right eval Left eval L 1/18  Shoulder flexion  95* 95  Shoulder extension     Shoulder abduction  Scaption PROM 100* 80  Shoulder adduction     Shoulder internal rotation  Full IR PROM  with elbow at 0* ABD   Full  Shoulder external rotation  Approximately 30* at 0* ABD  neutral  Elbow flexion     Elbow extension     Wrist flexion     Wrist extension     Wrist ulnar deviation     Wrist radial deviation     Wrist pronation     Wrist supination      (Blank rows = not tested) TODAY'S TREATMENT:  DATE:   2/15  PROM to pt tolerance  Exercises -supine well arm assist flexion- 1x10 -Supine cane flexion 1x10 -Ball rolls at table using LUE- flexion and scaption 2x10ea - Flexion-Extension Shoulder Pendulum with Table Support x20 - Circular Shoulder Pendulum with Table Support  - x20 - Seated Elbow Flexion AROM  x30 -Seated scapular retraction: 5" hold-2x15  2/9  PROM to pt tolerance STM to L UT and levator    Exercises - Flexion-Extension Shoulder Pendulum with Table Support x20 - Circular Shoulder Pendulum with Table Support  - x20 - Seated Elbow Flexion AROM  x30 -Seated scapular retraction: 5" hold-2x15 -Seated table slides flexion and abduction x10 ea  2/5  PROM to pt tolerance   Exercises - Flexion-Extension Shoulder Pendulum with Table Support x20 - Circular Shoulder Pendulum with Table Support  - x20 - Seated Elbow Flexion AROM  x20 -Seated scapular retraction: 5" hold-2x15  1/23  PROM to pt tolerance in flexion, scaption, and ABD; rotation kept to neutral ER, IR full   Exercises - Flexion-Extension Shoulder Pendulum with Table Support x20 - Circular Shoulder Pendulum with Table Support  - x20 - Seated Elbow Flexion AAROM  x20 - Seated Gripping Towel x73mn -Seated scapular retraction: 5" hold-2x15 -seated wrist flexion/extension 2# 2x10ea - Standing Shoulder Scaption - 1x10  1/18  PROM to pt tolerance in flexion, scaption, and ABD; rotation kept to neutral ER, IR full   Exercises - Flexion-Extension Shoulder Pendulum with Table Support  - 2-3 x daily - 7 x weekly - 3 sets - 10 reps - Circular Shoulder Pendulum with Table Support  - 2-3 x daily - 7 x weekly - 3 sets - 10 reps - Seated Elbow Flexion AAROM  - 2-3 x daily - 7 x weekly - 2 sets - 10 reps - Supine  Shoulder Flexion PROM to 90 Degrees  - 2-3 x daily - 7 x weekly - 3 sets - 10 reps - Seated Gripping Towel  - 2-3 x daily - 7 x weekly - 3 sets - 10 reps - Standing Shoulder Scaption  - 2-3 x daily - 7 x weekly - 1 sets - 10 reps   Eval  Objective measures + appropriate education  BP 160/95 HR 59  Incisions C/D/I, seemed slightly red but no drainage noted with palpation around incisions    PATIENT EDUCATION: Education details: anatomy, exercise progression, DOMS expectations, envelope of function, HEP, POC  Person educated: Patient Education method: Explanation, Demonstration, and Handouts Education comprehension: verbalized understanding, returned demonstration, and needs further education  HOME EXERCISE PROGRAM:   Access Code: [email or web address] URL: [email or web address] Date: 08/01/2022 Prepared by: ADaleen Bo ASSESSMENT:  CLINICAL IMPRESSION: Continued with gentle PROM and therex progressions. Improving ROM compared to last few sessions. Able to trial AAROM in supine and AAROM ball rolls at plinth in standing without significant discomfort. He was again instructed to take it easy at home and avoid overusing the arm. Also instructed in appropriate progression level based on pain/discomfort in LUE, with which he verbalized understanding. He reports compliance with ice use and plans to use ice after session. Was sore by end of session.   OBJECTIVE IMPAIRMENTS: decreased ROM, decreased strength, hypomobility, increased edema, increased fascial restrictions, increased muscle spasms, impaired flexibility, postural dysfunction, obesity, and pain.   ACTIVITY LIMITATIONS: carrying, lifting, bathing, toileting, dressing, reach over head, hygiene/grooming, and caring for others  PARTICIPATION LIMITATIONS: driving, shopping, community activity, and occupation  PERSONAL FACTORS: Behavior pattern, Education, Fitness, and Past/current experiences  are also affecting patient's  functional outcome.   REHAB POTENTIAL: Good  CLINICAL DECISION MAKING: Stable/uncomplicated  EVALUATION COMPLEXITY: Low   GOALS: Goals reviewed with patient? Yes  SHORT TERM GOALS: Target date: 08/23/2022    Will be compliant with appropriate progressive HEP as appropriate post-op Baseline: Goal status: MET 08/19/22  2.  Surgical shoulder AROM and PROM to be full without increased pain  Baseline:  Goal status: INITIAL  3.  Pain in surgical shoulder to be no more than 2/10 with functional activities in PT and HEP  Baseline:  Goal status: INITIAL  4.  Will have better understanding of postural liimitations and general ergonomics  Baseline:  Goal status: INITIAL    LONG TERM GOALS: Target date: 09/20/2022    MMT in surgical shoulder to be at least 4/5 in all tested groups  Baseline:  Goal status: INITIAL  2.  Will have been able to return to work on limited duty basis as deemed appropriate by MD without increase in pain  Baseline:  Goal status: INITIAL  3.  Will be able to reach overhead to pull a fan or light switch with surgical UE without increase in pain Baseline:  Goal status: INITIAL  4.  Pain in surgical shoulder with functional activities at home and work to be no more than 2/10 Baseline:  Goal status: INITIAL    PLAN:  PT FREQUENCY:  2-3x/week   PT DURATION: 8 weeks  PLANNED INTERVENTIONS: Therapeutic exercises, Therapeutic activity, Neuromuscular re-education, Balance training, Gait training, Patient/Family education, Self Care, Joint mobilization, Aquatic Therapy, Dry Needling, Electrical stimulation, Cryotherapy, Moist heat, Taping, Ultrasound, Ionotophoresis 58m/ml Dexamethasone, Manual therapy, and Re-evaluation  PLAN FOR NEXT SESSION: per protocol (biceps tendon protocol per op note)   RSherlynn Carbon PTA  08/29/22 8:50 AM

## 2022-09-02 ENCOUNTER — Encounter (HOSPITAL_BASED_OUTPATIENT_CLINIC_OR_DEPARTMENT_OTHER): Payer: 59 | Admitting: Physical Therapy

## 2022-09-04 ENCOUNTER — Telehealth: Payer: Self-pay | Admitting: Orthopaedic Surgery

## 2022-09-04 ENCOUNTER — Ambulatory Visit (HOSPITAL_BASED_OUTPATIENT_CLINIC_OR_DEPARTMENT_OTHER): Payer: 59

## 2022-09-04 ENCOUNTER — Encounter (HOSPITAL_BASED_OUTPATIENT_CLINIC_OR_DEPARTMENT_OTHER): Payer: 59 | Admitting: Orthopaedic Surgery

## 2022-09-04 NOTE — Telephone Encounter (Signed)
Patient got called into work and was unable to go to P/T or appointment with Dr. Sammuel Hines.

## 2022-09-04 NOTE — Therapy (Incomplete)
OUTPATIENT PHYSICAL THERAPY SHOULDER EVALUATION   Patient Name: Micheal Mckinney MRN: [id number] DOB:10-07-1970, 52 y.o., male Today's Date: 09/04/2022  END OF SESSION:      Past Medical History:  Diagnosis Date   Allergy    hay fever   Foreign body (FB) in soft tissue    right palm   GERD (gastroesophageal reflux disease)    History of chicken pox    Hyperlipidemia    Hypertension    OSA (obstructive sleep apnea) 01/26/2012   does not use CPAP   Past Surgical History:  Procedure Laterality Date   CARPAL TUNNEL RELEASE Bilateral 05/04/2013   CERVICAL FUSION     Alternative to this done in 05/2017, disc replacement   FOREIGN BODY REMOVAL Right 05/18/2018   Procedure: REMOVAL FOREIGN BODY RIGHT PALM;  Surgeon: Leanora Cover, MD;  Location: [facility];  Service: Orthopedics;  Laterality: Right;   IRRIGATION AND DEBRIDEMENT SEBACEOUS CYST  2009    on back and neck   SHOULDER ARTHROSCOPY WITH ROTATOR CUFF REPAIR AND OPEN BICEPS TENODESIS Left 07/23/2022   Procedure: LEFT SHOULDER ARTHROSCOPY WITH ROTATOR CUFF REPAIR AND BICEPS TENODESIS;  Surgeon: Vanetta Mulders, MD;  Location: [facility];  Service: Orthopedics;  Laterality: Left;   Patient Active Problem List   Diagnosis Date Noted   Nontraumatic complete tear of left rotator cuff 07/23/2022   Biceps tendinitis of left upper extremity 07/23/2022   Visit for preventive health examination 10/31/2015   Acute drug-induced gout of right foot 08/15/2015   Hyperlipidemia 05/10/2013   Routine general medical examination at a health care facility 05/07/2013   Carpal tunnel syndrome 01/12/2013   HTN (hypertension) 01/26/2012   OSA (obstructive sleep apnea) 01/26/2012   Obesity 01/26/2012    PCP: Merri Ray MD   REFERRING PROVIDER: Vanetta Mulders, MD  REFERRING DIAG:  [phone] (ICD-10-CM) - Nontraumatic complete tear of left rotator cuff    THERAPY DIAG:  No diagnosis found.  Rationale  for Evaluation and Treatment: Rehabilitation  ONSET DATE:  07/23/22 DOS   Days since surgery: 84  SUBJECTIVE:                                                                                                                                                                                      SUBJECTIVE STATEMENT:   Pt reports  he continues to have difficulty sleeping due to shoulder soreness. Reports very mild improvement in pain since last visit.   Eval: Had surgery 07/23/22 for shoulder scope, RCR, biceps work too. Haven't moved shoulder much I don't know what to do with it. BP was getting a bit high yesterday,  I stopped taking pain meds bc of this yesterday. I'm not feeling well today honestly.   PERTINENT HISTORY: FROM MD NOTE 52 year old male with left shoulder subscapularis tear as well as biceps tendinitis.  At this time he is trialed multiple months of the band strengthening program as well as multiple injections into both shoulders.  He is not getting any persistent relief from his last injection in the left shoulder.  That effect I do believe that he would potentially be a candidate for arthroscopy with subscapularis repair and biceps tenodesis.  We discussed the limitations and restrictions associated with this.  We discussed the complications and possible outcomes associated with this.  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  He understands all of this.  He would like to proceed with left shoulder arthroscopy. Plan :     -Plan for left shoulder arthroscopy with subscapularis repair and biceps tenodesis  POSTOPERATIVE PLAN: He will follow the subscapularis and biceps tendon rehab protocol.  He may begin early active flexion in the scapular plane.  He will avoid active internal rotation or resisted external rotation.  He will follow-up in 2 weeks for suture removal.  He will be placed on aspirin for blood clot prevention.  PAIN:  Are you  having pain? Yes: NPRS scale: "0.5/10 Pain location: L shoulder in general  Pain description: dull pain  Aggravating factors: unsure  Relieving factors: unsure   PRECAUTIONS: Other: post op protocol   WEIGHT BEARING RESTRICTIONS: Yes NWB surgical UE   FALLS:  Has patient fallen in last 6 months? 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 Sammuel Hines in about 2 weeks   OBJECTIVE:    PATIENT SURVEYS:  FOTO unavailable; give at next available session  UPPER EXTREMITY ROM:   AROM scaption to 75 deg  Passive ROM Right eval Left eval L 1/18  Shoulder flexion  95* 95  Shoulder extension     Shoulder abduction  Scaption PROM 100* 80  Shoulder adduction     Shoulder internal rotation  Full IR PROM  with elbow at 0* ABD   Full  Shoulder external rotation  Approximately 30* at 0* ABD  neutral  Elbow flexion     Elbow extension     Wrist flexion     Wrist extension     Wrist ulnar deviation     Wrist radial deviation     Wrist pronation     Wrist supination     (Blank rows = not tested) TODAY'S TREATMENT:  DATE:   2/15  PROM to pt tolerance  Exercises -supine well arm assist flexion- 1x10 -Supine cane flexion 1x10 -Ball rolls at table using LUE- flexion and scaption 2x10ea - Flexion-Extension Shoulder Pendulum with Table Support x20 - Circular Shoulder Pendulum with Table Support  - x20 - Seated Elbow Flexion AROM   x30 -Seated scapular retraction: 5" hold-2x15  2/9  PROM to pt tolerance STM to L UT and levator    Exercises - Flexion-Extension Shoulder Pendulum with Table Support x20 - Circular Shoulder Pendulum with Table Support  - x20 - Seated Elbow Flexion AROM  x30 -Seated scapular retraction: 5" hold-2x15 -Seated table slides flexion and abduction x10 ea  2/5  PROM to pt tolerance   Exercises - Flexion-Extension Shoulder Pendulum with Table Support x20 - Circular Shoulder Pendulum with Table Support  - x20 - Seated Elbow Flexion AROM  x20 -Seated scapular retraction: 5" hold-2x15  1/23  PROM to pt tolerance in flexion, scaption, and ABD; rotation kept to neutral ER, IR full   Exercises - Flexion-Extension Shoulder Pendulum with Table Support x20 - Circular Shoulder Pendulum with Table Support  - x20 - Seated Elbow Flexion AAROM  x20 - Seated Gripping Towel x83mn -Seated scapular retraction: 5" hold-2x15 -seated wrist flexion/extension 2# 2x10ea - Standing Shoulder Scaption - 1x10  1/18  PROM to pt tolerance in flexion, scaption, and ABD; rotation kept to neutral ER, IR full   Exercises - Flexion-Extension Shoulder Pendulum with Table Support  - 2-3 x daily - 7 x weekly - 3 sets - 10 reps - Circular Shoulder Pendulum with Table Support  - 2-3 x daily - 7 x weekly - 3 sets - 10 reps - Seated Elbow Flexion AAROM  - 2-3 x daily - 7 x weekly - 2 sets - 10 reps - Supine Shoulder Flexion PROM to 90 Degrees  - 2-3 x daily - 7 x weekly - 3 sets - 10 reps - Seated Gripping Towel  - 2-3 x daily - 7 x weekly - 3 sets - 10 reps - Standing Shoulder Scaption  - 2-3 x daily - 7 x weekly - 1 sets - 10 reps   Eval  Objective measures + appropriate education  BP 160/95 HR 59  Incisions C/D/I, seemed slightly red but no drainage noted with palpation around incisions    PATIENT EDUCATION: Education details: anatomy, exercise progression, DOMS expectations, envelope of function,  HEP, POC  Person educated: Patient Education method: Explanation, Demonstration, and Handouts Education comprehension: verbalized understanding, returned demonstration, and needs further education  HOME EXERCISE PROGRAM:   Access Code: [email or web address] URL: [email or web address] Date: 08/01/2022 Prepared by: ADaleen Bo ASSESSMENT:  CLINICAL IMPRESSION: Continued with gentle PROM and therex progressions. Improving ROM compared to last few sessions. Able to trial AAROM in supine and AAROM ball rolls at plinth in standing without significant discomfort. He was again instructed to take it easy at home and avoid overusing the arm. Also instructed in appropriate progression level based on pain/discomfort in LUE, with which he verbalized understanding. He reports compliance with ice use and plans to use ice after session. Was sore by end of session.   OBJECTIVE IMPAIRMENTS: decreased ROM, decreased strength, hypomobility, increased edema, increased fascial restrictions, increased muscle spasms, impaired flexibility, postural dysfunction, obesity, and pain.   ACTIVITY LIMITATIONS: carrying, lifting, bathing, toileting, dressing, reach over head, hygiene/grooming, and caring for others  PARTICIPATION LIMITATIONS: driving, shopping, community activity, and occupation  PERSONAL FACTORS: Behavior pattern, Education, Fitness, and Past/current  experiences are also affecting patient's functional outcome.   REHAB POTENTIAL: Good  CLINICAL DECISION MAKING: Stable/uncomplicated  EVALUATION COMPLEXITY: Low   GOALS: Goals reviewed with patient? Yes  SHORT TERM GOALS: Target date: 08/23/2022    Will be compliant with appropriate progressive HEP as appropriate post-op Baseline: Goal status: MET 08/19/22  2.  Surgical shoulder AROM and PROM to be full without increased pain  Baseline:  Goal status: INITIAL  3.  Pain in surgical shoulder to be no more than 2/10 with functional activities in  PT and HEP  Baseline:  Goal status: INITIAL  4.  Will have better understanding of postural liimitations and general ergonomics  Baseline:  Goal status: INITIAL    LONG TERM GOALS: Target date: 09/20/2022    MMT in surgical shoulder to be at least 4/5 in all tested groups  Baseline:  Goal status: INITIAL  2.  Will have been able to return to work on limited duty basis as deemed appropriate by MD without increase in pain  Baseline:  Goal status: INITIAL  3.  Will be able to reach overhead to pull a fan or light switch with surgical UE without increase in pain Baseline:  Goal status: INITIAL  4.  Pain in surgical shoulder with functional activities at home and work to be no more than 2/10 Baseline:  Goal status: INITIAL    PLAN:  PT FREQUENCY:  2-3x/week   PT DURATION: 8 weeks  PLANNED INTERVENTIONS: Therapeutic exercises, Therapeutic activity, Neuromuscular re-education, Balance training, Gait training, Patient/Family education, Self Care, Joint mobilization, Aquatic Therapy, Dry Needling, Electrical stimulation, Cryotherapy, Moist heat, Taping, Ultrasound, Ionotophoresis 82m/ml Dexamethasone, Manual therapy, and Re-evaluation  PLAN FOR NEXT SESSION: per protocol (biceps tendon protocol per op note)   RSherlynn Carbon PTA  09/04/22 7:55 AM

## 2022-09-06 ENCOUNTER — Encounter (HOSPITAL_BASED_OUTPATIENT_CLINIC_OR_DEPARTMENT_OTHER): Payer: 59 | Admitting: Physical Therapy

## 2022-09-10 ENCOUNTER — Encounter (HOSPITAL_BASED_OUTPATIENT_CLINIC_OR_DEPARTMENT_OTHER): Payer: 59

## 2022-09-12 ENCOUNTER — Encounter (HOSPITAL_BASED_OUTPATIENT_CLINIC_OR_DEPARTMENT_OTHER): Payer: 59 | Admitting: Physical Therapy

## 2022-09-13 ENCOUNTER — Ambulatory Visit (INDEPENDENT_AMBULATORY_CARE_PROVIDER_SITE_OTHER): Payer: 59 | Admitting: Orthopaedic Surgery

## 2022-09-13 DIAGNOSIS — M75122 Complete rotator cuff tear or rupture of left shoulder, not specified as traumatic: Secondary | ICD-10-CM | POA: Diagnosis not present

## 2022-09-13 DIAGNOSIS — M7581 Other shoulder lesions, right shoulder: Secondary | ICD-10-CM

## 2022-09-13 MED ORDER — TRIAMCINOLONE ACETONIDE 40 MG/ML IJ SUSP
80.0000 mg | INTRAMUSCULAR | Status: AC | PRN
  Administered 2022-09-13: 80 mg via INTRA_ARTICULAR

## 2022-09-13 MED ORDER — LIDOCAINE HCL 1 % IJ SOLN
4.0000 mL | INTRAMUSCULAR | Status: AC | PRN
  Administered 2022-09-13: 4 mL

## 2022-09-13 NOTE — Progress Notes (Signed)
Post Operative Evaluation    Procedure/Date of Surgery: Left shoulder arthroscopy with rotator cuff debridement biceps tenodesis 07/23/22  Interval History:    Presents status post the above procedure.  Overall he is swollen with limited motion about the left shoulder.  He states that he feels like he is doing better at 2 weeks as opposed to his 6-week postop appointment is here today with limited motion.   PMH/PSH/Family History/Social History/Meds/Allergies:    Past Medical History:  Diagnosis Date  . Allergy    hay fever  . Foreign body (FB) in soft tissue    right palm  . GERD (gastroesophageal reflux disease)   . History of chicken pox   . Hyperlipidemia   . Hypertension   . OSA (obstructive sleep apnea) 01/26/2012   does not use CPAP   Past Surgical History:  Procedure Laterality Date  . CARPAL TUNNEL RELEASE Bilateral 05/04/2013  . CERVICAL FUSION     Alternative to this done in 05/2017, disc replacement  . FOREIGN BODY REMOVAL Right 05/18/2018   Procedure: REMOVAL FOREIGN BODY RIGHT PALM;  Surgeon: Leanora Cover, MD;  Location: [facility];  Service: Orthopedics;  Laterality: Right;  . IRRIGATION AND DEBRIDEMENT SEBACEOUS CYST  2009    on back and neck  . SHOULDER ARTHROSCOPY WITH ROTATOR CUFF REPAIR AND OPEN BICEPS TENODESIS Left 07/23/2022   Procedure: LEFT SHOULDER ARTHROSCOPY WITH ROTATOR CUFF REPAIR AND BICEPS TENODESIS;  Surgeon: Vanetta Mulders, MD;  Location: [facility];  Service: Orthopedics;  Laterality: Left;   Social History   Socioeconomic History  . Marital status: Married    Spouse name: Not on file  . Number of children: Not on file  . Years of education: Not on file  . Highest education level: Not on file  Occupational History  . Not on file  Tobacco Use  . Smoking status: Former    Packs/day: 0.25    Years: 25.00    Total pack years: 6.25    Types: Cigarettes    Quit date:  01/16/2020    Years since quitting: 2.6  . Smokeless tobacco: Never  . Tobacco comments:    Chewing tobacco  Vaping Use  . Vaping Use: Never used  Substance and Sexual Activity  . Alcohol use: No    Alcohol/week: 0.0 standard drinks of alcohol  . Drug use: No  . Sexual activity: Yes  Other Topics Concern  . Not on file  Social History Narrative   Lives with wife and dogs   Social Determinants of Health   Financial Resource Strain: Not on file  Food Insecurity: Not on file  Transportation Needs: Not on file  Physical Activity: Not on file  Stress: Not on file  Social Connections: Not on file   Family History  Problem Relation Age of Onset  . Cancer Mother        breast  . Stroke Father    Allergies  Allergen Reactions  . Chlorhexidine Dermatitis   Current Outpatient Medications  Medication Sig Dispense Refill  . aspirin EC 325 MG tablet Take 1 tablet (325 mg total) by mouth daily. 30 tablet 0  . atenolol (TENORMIN) 50 MG tablet Take 1 tablet (50 mg total) by mouth daily. 90 tablet 3  . cyclobenzaprine (FLEXERIL) 5 MG tablet Take  1 tablet (5 mg total) by mouth 3 (three) times daily as needed for muscle spasms (start qhs prn due to sedation). (Patient not taking: Reported on 03/21/2022) 15 tablet 1  . ezetimibe (ZETIA) 10 MG tablet Take 1 tablet (10 mg total) by mouth daily. 90 tablet 3  . methylPREDNISolone (MEDROL DOSEPAK) 4 MG TBPK tablet Take per packet instructions (Patient not taking: Reported on 03/21/2022) 21 tablet 0  . oxyCODONE (OXY IR/ROXICODONE) 5 MG immediate release tablet Take 1 tablet (5 mg total) by mouth every 4 (four) hours as needed (severe pain). 10 tablet 0  . ranitidine (ZANTAC) 150 MG tablet Take 1 tablet by mouth as needed.    . rosuvastatin (CRESTOR) 5 MG tablet Take 1 tablet (5 mg total) by mouth daily. 90 tablet 3  . telmisartan (MICARDIS) 40 MG tablet Take 1 tablet (40 mg total) by mouth daily. 90 tablet 3   No current facility-administered  medications for this visit.   No results found.  Review of Systems:   A ROS was performed including pertinent positives and negatives as documented in the HPI.   Musculoskeletal Exam:    There were no vitals taken for this visit.  Left shoulder incisions are well healing without erythema or drainage.  In the standing position he is able to forward elevate to 140 degrees although external rotation at the side is to 40 compared to 60 on the other side.  IR is extremely limited to side..  Internal rotation deferred today.  Remainder of distal neurosensory exam is intact  Imaging:      I personally reviewed and interpreted the radiographs.   Assessment:   6 weeks status post left shoulder rotator cuff debridement and biceps tenodesis overall doing extremely well.  She does not appear to have evidence of left shoulder capsulitis at today's visit.  With regard to the right shoulder has had continued tendinitis which is very difficult to rehab the left shoulder.  Side effect I do believe he would benefit from bilateral injections at today's visit in order to promote resolution of his capsulitis on the tenderness on the right.  Return to clinic in 6 weeks.  I would like him to work in physical therapy for range of motion passively on the left shoulder for the next 2 weeks.  Plan :    -Return to clinic in 6 weeks for reassessment    Procedure Note  Patient: Micheal Mckinney             Date of Birth: 03-Feb-1971           MRN: [id number]             Visit Date: 09/13/2022  Procedures: Visit Diagnoses: No diagnosis found.  Large Joint Inj: L glenohumeral on 09/13/2022 8:47 AM Indications: pain Details: 22 G 1.5 in needle, ultrasound-guided anterior approach  Arthrogram: No  Medications: 4 mL lidocaine 1 %; 80 mg triamcinolone acetonide 40 MG/ML Outcome: tolerated well, no immediate complications Procedure, treatment alternatives, risks and benefits explained, specific risks  discussed. Consent was given by the patient. Immediately prior to procedure a time out was called to verify the correct patient, procedure, equipment, support staff and site/side marked as required. Patient was prepped and draped in the usual sterile fashion.    Large Joint Inj: R subacromial bursa on 09/13/2022 8:48 AM Indications: pain Details: 22 G 1.5 in needle, ultrasound-guided anterior approach  Arthrogram: No  Medications: 4 mL lidocaine 1 %; 80 mg triamcinolone  acetonide 40 MG/ML Outcome: tolerated well, no immediate complications Procedure, treatment alternatives, risks and benefits explained, specific risks discussed. Consent was given by the patient. Immediately prior to procedure a time out was called to verify the correct patient, procedure, equipment, support staff and site/side marked as required. Patient was prepped and draped in the usual sterile fashion.         I personally saw and evaluated the patient, and participated in the management and treatment plan.  Vanetta Mulders, MD Attending Physician, Orthopedic Surgery  This document was dictated using Dragon voice recognition software. A reasonable attempt at proof reading has been made to minimize errors.

## 2022-09-16 ENCOUNTER — Encounter (HOSPITAL_BASED_OUTPATIENT_CLINIC_OR_DEPARTMENT_OTHER): Payer: 59 | Admitting: Physical Therapy

## 2022-09-18 ENCOUNTER — Encounter (HOSPITAL_BASED_OUTPATIENT_CLINIC_OR_DEPARTMENT_OTHER): Payer: 59

## 2022-09-20 ENCOUNTER — Ambulatory Visit (HOSPITAL_BASED_OUTPATIENT_CLINIC_OR_DEPARTMENT_OTHER): Payer: 59 | Attending: Orthopaedic Surgery

## 2022-09-20 ENCOUNTER — Encounter (HOSPITAL_BASED_OUTPATIENT_CLINIC_OR_DEPARTMENT_OTHER): Payer: Self-pay

## 2022-09-20 ENCOUNTER — Encounter (HOSPITAL_BASED_OUTPATIENT_CLINIC_OR_DEPARTMENT_OTHER): Payer: 59 | Admitting: Physical Therapy

## 2022-09-20 DIAGNOSIS — M25612 Stiffness of left shoulder, not elsewhere classified: Secondary | ICD-10-CM | POA: Insufficient documentation

## 2022-09-20 DIAGNOSIS — M25512 Pain in left shoulder: Secondary | ICD-10-CM | POA: Insufficient documentation

## 2022-09-20 DIAGNOSIS — M6281 Muscle weakness (generalized): Secondary | ICD-10-CM | POA: Diagnosis present

## 2022-09-20 DIAGNOSIS — G8929 Other chronic pain: Secondary | ICD-10-CM | POA: Insufficient documentation

## 2022-09-20 NOTE — Therapy (Addendum)
OUTPATIENT PHYSICAL THERAPY SHOULDER EVALUATION   Patient Name: Micheal Mckinney MRN: [id number] DOB:[birth year], 52 y.o., male Today's Date: 09/20/2022  END OF SESSION:  PT End of Session - 09/20/22 0756     Visit Number 7    Number of Visits 25    Date for PT Re-Evaluation 09/20/22    Authorization Type U[employer]    Authorization Time Period 07/25/22 to 10/04/22    PT Start Time 0800    PT Stop Time 0844    PT Time Calculation (min) 44 min    Activity Tolerance Patient tolerated treatment well    Behavior During Therapy [facility] for tasks assessed/performed               Past Medical History:  Diagnosis Date   Allergy    hay fever   Foreign body (FB) in soft tissue    right palm   GERD (gastroesophageal reflux disease)    History of chicken pox    Hyperlipidemia    Hypertension    OSA (obstructive sleep apnea) 01/26/2012   does not use CPAP   Past Surgical History:  Procedure Laterality Date   CARPAL TUNNEL RELEASE Bilateral 05/04/2013   CERVICAL FUSION     Alternative to this done in 05/2017, disc replacement   FOREIGN BODY REMOVAL Right 05/18/2018   Procedure: REMOVAL FOREIGN BODY RIGHT PALM;  Surgeon: Leanora Cover, MD;  Location: [place];  Service: Orthopedics;  Laterality: Right;   IRRIGATION AND DEBRIDEMENT SEBACEOUS CYST  2009    on back and neck   SHOULDER ARTHROSCOPY WITH ROTATOR CUFF REPAIR AND OPEN BICEPS TENODESIS Left 07/23/2022   Procedure: LEFT SHOULDER ARTHROSCOPY WITH ROTATOR CUFF REPAIR AND BICEPS TENODESIS;  Surgeon: Vanetta Mulders, MD;  Location: [place];  Service: Orthopedics;  Laterality: Left;   Patient Active Problem List   Diagnosis Date Noted   Nontraumatic complete tear of left rotator cuff 07/23/2022   Biceps tendinitis of left upper extremity 07/23/2022   Visit for preventive health examination 10/31/2015   Acute drug-induced gout of right foot 08/15/2015   Hyperlipidemia 05/10/2013   Routine general  medical examination at a health care facility 05/07/2013   Carpal tunnel syndrome 01/12/2013   HTN (hypertension) 01/26/2012   OSA (obstructive sleep apnea) 01/26/2012   Obesity 01/26/2012    PCP: Merri Ray MD   REFERRING PROVIDER: Vanetta Mulders, MD  REFERRING DIAG:  [phone] (ICD-10-CM) - Nontraumatic complete tear of left rotator cuff    THERAPY DIAG:  Chronic left shoulder pain  Muscle weakness (generalized)  Stiffness of left shoulder, not elsewhere classified  Rationale for Evaluation and Treatment: Rehabilitation  ONSET DATE:  07/23/22 DOS   Days since surgery: 59  SUBJECTIVE:  SUBJECTIVE STATEMENT:   Pt reports he had cortisone injection at last MD visit 1 week ago which helped with pain. Still has trouble reaching behind head.   Eval: Had surgery 07/23/22 for shoulder scope, RCR, biceps work too. Haven't moved shoulder much I don't know what to do with it. BP was getting a bit high yesterday,  I stopped taking pain meds bc of this yesterday. I'm not feeling well today honestly.   PERTINENT HISTORY: FROM MD NOTE 52 year old male with left shoulder subscapularis tear as well as biceps tendinitis.  At this time he is trialed multiple months of the band strengthening program as well as multiple injections into both shoulders.  He is not getting any persistent relief from his last injection in the left shoulder.  That effect I do believe that he would potentially be a candidate for arthroscopy with subscapularis repair and biceps tenodesis.  We discussed the limitations and restrictions associated with this.  We discussed the complications and possible outcomes associated with this.  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  He understands all of  this.  He would like to proceed with left shoulder arthroscopy. Plan :     -Plan for left shoulder arthroscopy with subscapularis repair and biceps tenodesis  POSTOPERATIVE PLAN: He will follow the subscapularis and biceps tendon rehab protocol.  He may begin early active flexion in the scapular plane.  He will avoid active internal rotation or resisted external rotation.  He will follow-up in 2 weeks for suture removal.  He will be placed on aspirin for blood clot prevention.  PAIN:  Are you having pain? Yes: NPRS scale: "0.5/10 Pain location: L shoulder in general  Pain description: dull pain  Aggravating factors: unsure  Relieving factors: unsure   PRECAUTIONS: Other: post op protocol   WEIGHT BEARING RESTRICTIONS: Yes NWB surgical UE   FALLS:  Has patient fallen in last 6 months? 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 Sammuel Hines in about 2 weeks   OBJECTIVE:    PATIENT SURVEYS:  FOTO unavailable; give at next available session  UPPER EXTREMITY ROM:   AROM scaption to 75 deg  Passive ROM Right eval Left eval L 1/18  Shoulder flexion  95* 95  Shoulder extension     Shoulder abduction  Scaption PROM 100* 80  Shoulder adduction     Shoulder internal rotation  Full IR PROM  with elbow at 0* ABD   Full  Shoulder external rotation  Approximately 30* at 0* ABD  neutral  Elbow flexion     Elbow extension     Wrist flexion     Wrist extension     Wrist ulnar deviation     Wrist radial deviation     Wrist pronation     Wrist supination      (Blank rows = not tested) TODAY'S TREATMENT:  DATE:   3/8  PROM to pt tolerance  -Supine active flexion-2x10 -Supine ABCs 1#- x1 -Sidelying abduction 2x10 -sidelying ER- 2x10 -Standing flexion 2x10 -Standing abduction 1x10 -IR behind back with cane-5sec hold 2x5 (gentle) -HEP review  2/15  PROM to pt tolerance  Exercises -supine well arm assist flexion- 1x10 -Supine cane flexion 1x10 -Ball rolls at table using LUE- flexion and scaption 2x10ea - Flexion-Extension Shoulder Pendulum with Table Support x20 - Circular Shoulder Pendulum with Table Support  - x20 - Seated Elbow Flexion AROM  x30 -Seated scapular retraction: 5" hold-2x15  2/9  PROM to pt tolerance STM to L UT and levator    Exercises - Flexion-Extension Shoulder Pendulum with Table Support x20 - Circular Shoulder Pendulum with Table Support  - x20 - Seated Elbow Flexion AROM  x30 -Seated scapular retraction: 5" hold-2x15 -Seated table slides flexion and abduction x10 ea  2/5  PROM to pt tolerance   Exercises - Flexion-Extension Shoulder Pendulum with Table Support x20 - Circular Shoulder Pendulum with Table Support  - x20 - Seated Elbow Flexion AROM  x20 -Seated scapular retraction: 5" hold-2x15   Eval  Objective measures + appropriate education  BP 160/95 HR 59  Incisions C/D/I, seemed slightly red but no drainage noted with palpation around incisions    PATIENT EDUCATION: Education details: anatomy, exercise progression, DOMS expectations, envelope of function, HEP, POC  Person educated: Patient Education method: Explanation, Demonstration, and Handouts Education comprehension: verbalized understanding, returned demonstration, and needs further education  HOME EXERCISE PROGRAM:   Access Code: [email or web address] URL:  [email or web address] Date: 08/01/2022 Prepared by: Daleen Bo  ASSESSMENT:  CLINICAL IMPRESSION: Pt is now over 8 weeks s/p. He has improved tolerance for exercise and is able to complete AROM with greater ease than previous session and is not limited by pain. Most limited in IR passively, but not significantly so. Added IR stretch behind back with cues for gentle performance.  Again re-emphasized importance of avoiding lifting and painful movements. Updated HEP to include AROM in gravity dependent positions. Pt needs re-eval next visit.   OBJECTIVE IMPAIRMENTS: decreased ROM, decreased strength, hypomobility, increased edema, increased fascial restrictions, increased muscle spasms, impaired flexibility, postural dysfunction, obesity, and pain.   ACTIVITY LIMITATIONS: carrying, lifting, bathing, toileting, dressing, reach over head, hygiene/grooming, and caring for others  PARTICIPATION LIMITATIONS: driving, shopping, community activity, and occupation  PERSONAL FACTORS: Behavior pattern, Education, Fitness, and Past/current experiences are also affecting patient's functional outcome.   REHAB POTENTIAL: Good  CLINICAL DECISION MAKING: Stable/uncomplicated  EVALUATION COMPLEXITY: Low   GOALS: Goals reviewed with patient? Yes  SHORT TERM GOALS: Target date: 08/23/2022    Will be compliant with appropriate progressive HEP as appropriate post-op Baseline: Goal status: MET 08/19/22  2.  Surgical shoulder AROM and PROM to be full without increased pain  Baseline:  Goal status: IN PROGRESS  3.  Pain in surgical shoulder to be no more than 2/10 with functional activities in PT and HEP  Baseline:  Goal status: INITIAL  4.  Will have better understanding of postural liimitations and general ergonomics  Baseline:  Goal status: IN PROGRESS    LONG TERM GOALS: Target date: 09/20/2022    MMT in surgical shoulder to be at least 4/5 in all tested groups  Baseline:  Goal  status: INITIAL  2.  Will have been able to return to work on limited duty basis as deemed appropriate by MD without increase in pain  Baseline:  Goal status: INITIAL  3.  Will be able to reach overhead to pull a fan or light switch with surgical UE without increase in pain Baseline:  Goal status: INITIAL  4.  Pain in surgical shoulder with functional activities at home and work to be no more than 2/10 Baseline:  Goal status: INITIAL    PLAN:  PT FREQUENCY:  2-3x/week   PT DURATION: 8 weeks  PLANNED INTERVENTIONS: Therapeutic exercises, Therapeutic activity, Neuromuscular re-education, Balance training, Gait training, Patient/Family education, Self Care, Joint mobilization, Aquatic Therapy, Dry Needling, Electrical stimulation, Cryotherapy, Moist heat, Taping, Ultrasound, Ionotophoresis '4mg'$ /ml Dexamethasone, Manual therapy, and Re-evaluation  PLAN FOR NEXT SESSION: per protocol (biceps tendon protocol per op note)   Sherlynn Carbon, PTA  09/20/22 8:57 AM

## 2022-09-24 ENCOUNTER — Encounter (HOSPITAL_BASED_OUTPATIENT_CLINIC_OR_DEPARTMENT_OTHER): Payer: 59

## 2022-09-26 ENCOUNTER — Encounter (HOSPITAL_BASED_OUTPATIENT_CLINIC_OR_DEPARTMENT_OTHER): Payer: 59

## 2022-09-26 ENCOUNTER — Other Ambulatory Visit: Payer: Self-pay | Admitting: Family Medicine

## 2022-09-26 ENCOUNTER — Encounter (HOSPITAL_BASED_OUTPATIENT_CLINIC_OR_DEPARTMENT_OTHER): Payer: 59 | Admitting: Physical Therapy

## 2022-09-26 DIAGNOSIS — E785 Hyperlipidemia, unspecified: Secondary | ICD-10-CM

## 2022-09-30 ENCOUNTER — Encounter (HOSPITAL_BASED_OUTPATIENT_CLINIC_OR_DEPARTMENT_OTHER): Payer: Self-pay | Admitting: Physical Therapy

## 2022-09-30 ENCOUNTER — Ambulatory Visit (HOSPITAL_BASED_OUTPATIENT_CLINIC_OR_DEPARTMENT_OTHER): Payer: 59 | Admitting: Physical Therapy

## 2022-09-30 DIAGNOSIS — M25612 Stiffness of left shoulder, not elsewhere classified: Secondary | ICD-10-CM

## 2022-09-30 DIAGNOSIS — G8929 Other chronic pain: Secondary | ICD-10-CM

## 2022-09-30 DIAGNOSIS — M6281 Muscle weakness (generalized): Secondary | ICD-10-CM

## 2022-09-30 DIAGNOSIS — M25512 Pain in left shoulder: Secondary | ICD-10-CM | POA: Diagnosis not present

## 2022-09-30 NOTE — Therapy (Addendum)
OUTPATIENT PHYSICAL THERAPY SHOULDER EVALUATION   Patient Name: Micheal Mckinney MRN: [id number] DOB:12-20-1970, 52 y.o., male Today's Date: 09/30/2022  END OF SESSION:  PT End of Session - 09/30/22 1610     Visit Number 8    Number of Visits 25    Date for PT Re-Evaluation 09/20/22    Authorization Type UHC    Authorization Time Period 07/25/22 to 10/04/22    PT Start Time 1531    PT Stop Time 1600    PT Time Calculation (min) 29 min    Activity Tolerance Patient tolerated treatment well    Behavior During Therapy [facility] for tasks assessed/performed                Past Medical History:  Diagnosis Date   Allergy    hay fever   Foreign body (FB) in soft tissue    right palm   GERD (gastroesophageal reflux disease)    History of chicken pox    Hyperlipidemia    Hypertension    OSA (obstructive sleep apnea) 01/26/2012   does not use CPAP   Past Surgical History:  Procedure Laterality Date   CARPAL TUNNEL RELEASE Bilateral 05/04/2013   CERVICAL FUSION     Alternative to this done in 05/2017, disc replacement   FOREIGN BODY REMOVAL Right 05/18/2018   Procedure: REMOVAL FOREIGN BODY RIGHT PALM;  Surgeon: Leanora Cover, MD;  Location: [place];  Service: Orthopedics;  Laterality: Right;   IRRIGATION AND DEBRIDEMENT SEBACEOUS CYST  2009    on back and neck   SHOULDER ARTHROSCOPY WITH ROTATOR CUFF REPAIR AND OPEN BICEPS TENODESIS Left 07/23/2022   Procedure: LEFT SHOULDER ARTHROSCOPY WITH ROTATOR CUFF REPAIR AND BICEPS TENODESIS;  Surgeon: Vanetta Mulders, MD;  Location: [place];  Service: Orthopedics;  Laterality: Left;   Patient Active Problem List   Diagnosis Date Noted   Nontraumatic complete tear of left rotator cuff 07/23/2022   Biceps tendinitis of left upper extremity 07/23/2022   Visit for preventive health examination 10/31/2015   Acute drug-induced gout of right foot 08/15/2015   Hyperlipidemia 05/10/2013   Routine general  medical examination at a health care facility 05/07/2013   Carpal tunnel syndrome 01/12/2013   HTN (hypertension) 01/26/2012   OSA (obstructive sleep apnea) 01/26/2012   Obesity 01/26/2012    PCP: Merri Ray MD   REFERRING PROVIDER: Vanetta Mulders, MD  REFERRING DIAG:  [phone] (ICD-10-CM) - Nontraumatic complete tear of left rotator cuff    THERAPY DIAG:  Chronic left shoulder pain  Muscle weakness (generalized)  Stiffness of left shoulder, not elsewhere classified  Rationale for Evaluation and Treatment: Rehabilitation  ONSET DATE:  07/23/22 DOS   Days since surgery: 69  SUBJECTIVE:  SUBJECTIVE STATEMENT:  Pt states that the shoulder is 80% better since starting therapy. Pt states that the shoulder is feeling better and no pain with HEP.   Eval: Had surgery 07/23/22 for shoulder scope, RCR, biceps work too. Haven't moved shoulder much I don't know what to do with it. BP was getting a bit high yesterday,  I stopped taking pain meds bc of this yesterday. I'm not feeling well today honestly.   PERTINENT HISTORY: FROM MD NOTE 52 year old male with left shoulder subscapularis tear as well as biceps tendinitis.  At this time he is trialed multiple months of the band strengthening program as well as multiple injections into both shoulders.  He is not getting any persistent relief from his last injection in the left shoulder.  That effect I do believe that he would potentially be a candidate for arthroscopy with subscapularis repair and biceps tenodesis.  We discussed the limitations and restrictions associated with this.  We discussed the complications and possible outcomes associated with this.  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  He understands all  of this.  He would like to proceed with left shoulder arthroscopy. Plan :     -Plan for left shoulder arthroscopy with subscapularis repair and biceps tenodesis  POSTOPERATIVE PLAN: He will follow the subscapularis and biceps tendon rehab protocol.  He may begin early active flexion in the scapular plane.  He will avoid active internal rotation or resisted external rotation.  He will follow-up in 2 weeks for suture removal.  He will be placed on aspirin for blood clot prevention.  PAIN:  Are you having pain? Yes: NPRS scale: "0.5/10 Pain location: L shoulder in general  Pain description: dull pain  Aggravating factors: unsure  Relieving factors: unsure   PRECAUTIONS: Other: post op protocol   WEIGHT BEARING RESTRICTIONS: Yes NWB surgical UE   FALLS:  Has patient fallen in last 6 months? 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 Sammuel Hines in about 2 weeks   OBJECTIVE:   UPPER EXTREMITY ROM:   AROM scaption to 75 deg  Passive ROM Right eval Left eval L 1/18 L  3/18  AROM  Shoulder flexion  95* 95 154  Shoulder extension    45  Shoulder abduction  Scaption PROM 100* 80 140  Shoulder adduction      Shoulder internal rotation  Full IR PROM  with elbow at 0* ABD   Full L2  Shoulder external rotation  Approximately 30* at 0* ABD  neutral 40  Elbow flexion      Elbow extension      Wrist flexion      Wrist extension      Wrist ulnar deviation      Wrist radial deviation      Wrist pronation      Wrist supination      (Blank rows = not  tested)  MMT Right 3/18  Left 3/18  Shoulder flexion 4 4-  Shoulder extension    Shoulder abduction 4 4-  Shoulder adduction    Shoulder internal rotation 4 4-  Shoulder external rotation 4 4-  (Blank rows = not tested)   Full break test no performed due to healing precautions but pt able to maintain shoulder position with light resistance for 3s  TODAY'S TREATMENT:                                                                                                                                         DATE:   3/18  Inf joint mobs grade III- L UE  Pec stretch 30s 3x  RTB row 3x10 RTB ER 3x10 Biceps stretch at table 30s 3x Scaption 1lb 3x8   3/8  PROM to pt tolerance  -Supine active flexion-2x10 -Supine ABCs 1#- x1 -Sidelying abduction 2x10 -sidelying ER- 2x10 -Standing flexion 2x10 -Standing abduction 1x10 -IR behind back with cane-5sec hold 2x5 (gentle) -HEP review  2/15  PROM to pt tolerance  Exercises -supine well arm assist flexion- 1x10 -Supine cane flexion 1x10 -Ball rolls at table using LUE- flexion and scaption 2x10ea - Flexion-Extension Shoulder Pendulum with Table Support x20 - Circular Shoulder Pendulum with Table Support  - x20 - Seated Elbow Flexion AROM  x30 -Seated scapular retraction: 5" hold-2x15  2/9  PROM to pt tolerance STM to L UT and levator    Exercises - Flexion-Extension Shoulder Pendulum with Table Support x20 - Circular Shoulder Pendulum with Table Support  - x20 - Seated Elbow Flexion AROM  x30 -Seated scapular retraction: 5" hold-2x15 -Seated table slides flexion and abduction x10 ea  2/5  PROM to pt tolerance   Exercises - Flexion-Extension Shoulder Pendulum with Table Support x20 - Circular Shoulder Pendulum with Table Support  - x20 - Seated Elbow Flexion AROM  x20 -Seated scapular retraction: 5" hold-2x15   Eval  Objective measures + appropriate education  BP 160/95 HR 59  Incisions C/D/I, seemed  slightly red but no drainage noted with palpation around incisions    PATIENT EDUCATION: Education details: anatomy, exercise progression, DOMS expectations, envelope of function, HEP, POC  Person educated: Patient Education method: Explanation, Demonstration, and Handouts Education comprehension: verbalized understanding, returned demonstration, and needs further education  HOME EXERCISE PROGRAM:   Access Code: [email or web address] URL: [email or web address] Date: 08/01/2022 Prepared by: Daleen Bo  ASSESSMENT:  CLINICAL IMPRESSION: Pt demonstrates improvement with AROM and strength of the L UE at this time with well managed pain. Pt has reduced irritation with avoidance of OH and lifting at this time. Pt was advised to continue with avoiding heavy resistance but he is now able to start light resisted exercise of the L shoulder. No pain noted during session. Pt was able to improve OH mobility following joint mobilization. Consider repeat of shoulder joint mobs PRN as well as progression of L UE strength as able. Could likely add in likely CKC/WB exercise at next. Pt is still limited with full lifting and  occupational duties at this time. Pt would benefit from continued skilled therapy in order to reach goals and maximize functional L UE strength and ROM for full return to PLOF.   OBJECTIVE IMPAIRMENTS: decreased ROM, decreased strength, hypomobility, increased edema, increased fascial restrictions, increased muscle spasms, impaired flexibility, postural dysfunction, obesity, and pain.   ACTIVITY LIMITATIONS: carrying, lifting, bathing, toileting, dressing, reach over head, hygiene/grooming, and caring for others  PARTICIPATION LIMITATIONS: driving, shopping, community activity, and occupation  PERSONAL FACTORS: Behavior pattern, Education, Fitness, and Past/current experiences are also affecting patient's functional outcome.   REHAB POTENTIAL: Good  CLINICAL DECISION MAKING:  Stable/uncomplicated  EVALUATION COMPLEXITY: Low   GOALS: Goals reviewed with patient? Yes  SHORT TERM GOALS: Target date: 08/23/2022    Will be compliant with appropriate progressive HEP as appropriate post-op Baseline: Goal status: MET 08/19/22  2.  Surgical shoulder AROM and PROM to be full without increased pain  Baseline:  Goal status: ongoing  3.  Pain in surgical shoulder to be no more than 2/10 with functional activities in PT and HEP  Baseline:  Goal status: met  4.  Will have better understanding of postural liimitations and general ergonomics  Baseline:  Goal status: met    LONG TERM GOALS: Target date: 12/31/22    MMT in surgical shoulder to be at least 4/5 in all tested groups  Baseline:  Goal status: ongoing  2.  Will have been able to return to work on limited duty basis as deemed appropriate by MD without increase in pain  Baseline:  Goal status: ongoing  3.  Will be able to reach overhead to pull a fan or light switch with surgical UE without increase in pain Baseline:  Goal status: ongoing  4.  Pain in surgical shoulder with functional activities at home and work to be no more than 2/10 Baseline:  Goal status: ongoing    PLAN:  PT FREQUENCY:  1x/week   PT DURATION: 12 weeks  PLANNED INTERVENTIONS: Therapeutic exercises, Therapeutic activity, Neuromuscular re-education, Balance training, Gait training, Patient/Family education, Self Care, Joint mobilization, Aquatic Therapy, Dry Needling, Electrical stimulation, Cryotherapy, Moist heat, Taping, Ultrasound, Ionotophoresis 4mg /ml Dexamethasone, Manual therapy, and Re-evaluation  PLAN FOR NEXT SESSION: per protocol (biceps tendon protocol per op note)  Daleen Bo PT, DPT 09/30/22 4:11 PM

## 2022-10-10 ENCOUNTER — Ambulatory Visit (HOSPITAL_BASED_OUTPATIENT_CLINIC_OR_DEPARTMENT_OTHER): Payer: 59

## 2022-10-10 ENCOUNTER — Encounter (HOSPITAL_BASED_OUTPATIENT_CLINIC_OR_DEPARTMENT_OTHER): Payer: Self-pay

## 2022-10-10 DIAGNOSIS — M25512 Pain in left shoulder: Secondary | ICD-10-CM | POA: Diagnosis not present

## 2022-10-10 DIAGNOSIS — M6281 Muscle weakness (generalized): Secondary | ICD-10-CM

## 2022-10-10 DIAGNOSIS — G8929 Other chronic pain: Secondary | ICD-10-CM

## 2022-10-10 DIAGNOSIS — M25612 Stiffness of left shoulder, not elsewhere classified: Secondary | ICD-10-CM

## 2022-10-10 NOTE — Therapy (Signed)
OUTPATIENT PHYSICAL THERAPY SHOULDER EVALUATION   Patient Name: Micheal Mckinney MRN: [id number] DOB:1970/10/25, 52 y.o., male Today's Date: 10/10/2022  END OF SESSION:  PT End of Session - 10/10/22 1628     Visit Number 9    Number of Visits 25    Date for PT Re-Evaluation 09/20/22    Authorization Type UHC    Authorization Time Period 07/25/22 to 10/04/22    PT Start Time 1556    PT Stop Time [id number]    PT Time Calculation (min) 47 min    Activity Tolerance Patient tolerated treatment well    Behavior During Therapy [facility] for tasks assessed/performed                Past Medical History:  Diagnosis Date   Allergy    hay fever   Foreign body (FB) in soft tissue    right palm   GERD (gastroesophageal reflux disease)    History of chicken pox    Hyperlipidemia    Hypertension    OSA (obstructive sleep apnea) 01/26/2012   does not use CPAP   Past Surgical History:  Procedure Laterality Date   CARPAL TUNNEL RELEASE Bilateral 05/04/2013   CERVICAL FUSION     Alternative to this done in 05/2017, disc replacement   FOREIGN BODY REMOVAL Right 05/18/2018   Procedure: REMOVAL FOREIGN BODY RIGHT PALM;  Surgeon: Leanora Cover, MD;  Location: [place];  Service: Orthopedics;  Laterality: Right;   IRRIGATION AND DEBRIDEMENT SEBACEOUS CYST  2009    on back and neck   SHOULDER ARTHROSCOPY WITH ROTATOR CUFF REPAIR AND OPEN BICEPS TENODESIS Left 07/23/2022   Procedure: LEFT SHOULDER ARTHROSCOPY WITH ROTATOR CUFF REPAIR AND BICEPS TENODESIS;  Surgeon: Vanetta Mulders, MD;  Location: [place];  Service: Orthopedics;  Laterality: Left;   Patient Active Problem List   Diagnosis Date Noted   Nontraumatic complete tear of left rotator cuff 07/23/2022   Biceps tendinitis of left upper extremity 07/23/2022   Visit for preventive health examination 10/31/2015   Acute drug-induced gout of right foot 08/15/2015   Hyperlipidemia 05/10/2013   Routine general  medical examination at a health care facility 05/07/2013   Carpal tunnel syndrome 01/12/2013   HTN (hypertension) 01/26/2012   OSA (obstructive sleep apnea) 01/26/2012   Obesity 01/26/2012    PCP: Merri Ray MD   REFERRING PROVIDER: Vanetta Mulders, MD  REFERRING DIAG:  [phone] (ICD-10-CM) - Nontraumatic complete tear of left rotator cuff    THERAPY DIAG:  Chronic left shoulder pain  Muscle weakness (generalized)  Stiffness of left shoulder, not elsewhere classified  Rationale for Evaluation and Treatment: Rehabilitation  ONSET DATE:  07/23/22 DOS   Days since surgery: 79  SUBJECTIVE:  SUBJECTIVE STATEMENT:  Pt reports he "tweaked" his shoulder this morning when turning a valve at work. "I've done it before with no problem." Pt reports only mild lingering discomfort form this. Overall has been doing well with shoulder.   Eval: Had surgery 07/23/22 for shoulder scope, RCR, biceps work too. Haven't moved shoulder much I don't know what to do with it. BP was getting a bit high yesterday,  I stopped taking pain meds bc of this yesterday. I'm not feeling well today honestly.   PERTINENT HISTORY: FROM MD NOTE 52 year old male with left shoulder subscapularis tear as well as biceps tendinitis.  At this time he is trialed multiple months of the band strengthening program as well as multiple injections into both shoulders.  He is not getting any persistent relief from his last injection in the left shoulder.  That effect I do believe that he would potentially be a candidate for arthroscopy with subscapularis repair and biceps tenodesis.  We discussed the limitations and restrictions associated with this.  We discussed the complications and possible outcomes associated with this.  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  He understands all of this.  He would like to proceed with left shoulder arthroscopy. Plan :     -Plan for left shoulder arthroscopy with subscapularis repair and biceps tenodesis  POSTOPERATIVE PLAN: He will follow the subscapularis and biceps tendon rehab protocol.  He may begin early active flexion in the scapular plane.  He will avoid active internal rotation or resisted external rotation.  He will follow-up in 2 weeks for suture removal.  He will be placed on aspirin for blood clot prevention.  PAIN:  Are you having pain? Yes: NPRS scale: "0.5/10 Pain location: L shoulder in general  Pain description: dull pain  Aggravating factors: unsure  Relieving factors: unsure   PRECAUTIONS: Other: post op protocol   WEIGHT BEARING RESTRICTIONS: Yes NWB surgical UE   FALLS:  Has patient fallen in last 6 months? 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 Sammuel Hines in about 2 weeks   OBJECTIVE:   UPPER EXTREMITY ROM:   AROM scaption to 75 deg  Passive ROM Right eval Left eval L 1/18 L  3/18  AROM  Shoulder flexion  95* 95 154  Shoulder extension    45  Shoulder abduction  Scaption PROM 100* 80 140  Shoulder adduction      Shoulder internal rotation  Full IR PROM  with elbow at 0* ABD   Full L2  Shoulder external rotation  Approximately 30* at 0* ABD  neutral 40  Elbow flexion      Elbow extension      Wrist flexion      Wrist extension      Wrist ulnar deviation      Wrist radial  deviation      Wrist pronation      Wrist supination      (Blank rows = not tested)  MMT Right 3/18  Left 3/18  Shoulder flexion  4 4-  Shoulder extension    Shoulder abduction 4 4-  Shoulder adduction    Shoulder internal rotation 4 4-  Shoulder external rotation 4 4-  (Blank rows = not tested)   Full break test no performed due to healing precautions but pt able to maintain shoulder position with light resistance for 3s  TODAY'S TREATMENT:                                                                                                                                         DATE:   3/28  PROM L shoulder   Standing flexion 2# 2x8 Standing abduciton 2# 2x8  IR stretch behind back-active- 10seconds x10  Ball rolls at wall 4 way (shoulder height) 2.2# ball- x20 ea Wall clocks- RTB 1x5ea TB IR/ER RTB 2x10ea TB row GTB x30 TB extension RTB x30 Bent over row 8# 3x10 Bicep curls 5# x30  3/18  Inf joint mobs grade III- L UE  Pec stretch 30s 3x  RTB row 3x10 RTB ER 3x10 Biceps stretch at table 30s 3x Scaption 1lb 3x8   3/8  PROM to pt tolerance  -Supine active flexion-2x10 -Supine ABCs 1#- x1 -Sidelying abduction 2x10 -sidelying ER- 2x10 -Standing flexion 2x10 -Standing abduction 1x10 -IR behind back with cane-5sec hold 2x5 (gentle) -HEP review  2/15  PROM to pt tolerance  Exercises -supine well arm assist flexion- 1x10 -Supine cane flexion 1x10 -Ball rolls at table using LUE- flexion and scaption 2x10ea - Flexion-Extension Shoulder Pendulum with Table Support x20 - Circular Shoulder Pendulum with Table Support  - x20 - Seated Elbow Flexion AROM  x30 -Seated scapular retraction: 5" hold-2x15  2/9  PROM to pt tolerance STM to L UT and levator    Exercises - Flexion-Extension Shoulder Pendulum with Table Support x20 - Circular Shoulder Pendulum with Table Support  - x20 - Seated Elbow Flexion AROM  x30 -Seated scapular retraction: 5"  hold-2x15 -Seated table slides flexion and abduction x10 ea    PATIENT EDUCATION: Education details: anatomy, exercise progression, DOMS expectations, envelope of function, HEP, POC  Person educated: Patient Education method: Explanation, Demonstration, and Handouts Education comprehension: verbalized understanding, returned demonstration, and needs further education  HOME EXERCISE PROGRAM:   Access Code: [email or web address] URL: [email or web address] Date: 08/01/2022 Prepared by: Daleen Bo  ASSESSMENT:  CLINICAL IMPRESSION: Pt able to progress with strengthening today. He is improving with ROM but still limited with IR behind back. Worked on stretching this today. Pt demonstrated difficulty with endurance based exercises including ball 4 way at wall. He will benefit from continued strengthening to improve his function and decrease risk for re-injury.    OBJECTIVE IMPAIRMENTS: decreased ROM, decreased strength, hypomobility, increased edema, increased fascial restrictions, increased muscle spasms, impaired flexibility, postural dysfunction, obesity, and pain.   ACTIVITY LIMITATIONS: carrying, lifting, bathing, toileting, dressing, reach over head, hygiene/grooming, and caring for others  PARTICIPATION LIMITATIONS:  driving, shopping, community activity, and occupation  PERSONAL FACTORS: Behavior pattern, Education, Fitness, and Past/current experiences are also affecting patient's functional outcome.   REHAB POTENTIAL: Good  CLINICAL DECISION MAKING: Stable/uncomplicated  EVALUATION COMPLEXITY: Low   GOALS: Goals reviewed with patient? Yes  SHORT TERM GOALS: Target date: 08/23/2022    Will be compliant with appropriate progressive HEP as appropriate post-op Baseline: Goal status: MET 08/19/22  2.  Surgical shoulder AROM and PROM to be full without increased pain  Baseline:  Goal status: ongoing  3.  Pain in surgical shoulder to be no more than 2/10 with functional  activities in PT and HEP  Baseline:  Goal status: met  4.  Will have better understanding of postural liimitations and general ergonomics  Baseline:  Goal status: met    LONG TERM GOALS: Target date: 09/20/2022    MMT in surgical shoulder to be at least 4/5 in all tested groups  Baseline:  Goal status: ongoing  2.  Will have been able to return to work on limited duty basis as deemed appropriate by MD without increase in pain  Baseline:  Goal status: ongoing  3.  Will be able to reach overhead to pull a fan or light switch with surgical UE without increase in pain Baseline:  Goal status: ongoing  4.  Pain in surgical shoulder with functional activities at home and work to be no more than 2/10 Baseline:  Goal status: ongoing    PLAN:  PT FREQUENCY:  2-3x/week   PT DURATION: 8 weeks  PLANNED INTERVENTIONS: Therapeutic exercises, Therapeutic activity, Neuromuscular re-education, Balance training, Gait training, Patient/Family education, Self Care, Joint mobilization, Aquatic Therapy, Dry Needling, Electrical stimulation, Cryotherapy, Moist heat, Taping, Ultrasound, Ionotophoresis 4mg /ml Dexamethasone, Manual therapy, and Re-evaluation  PLAN FOR NEXT SESSION: per protocol (biceps tendon protocol per op note)  Sherlynn Carbon, PTA  10/10/22 4:49 PM

## 2022-10-10 NOTE — Addendum Note (Signed)
Addended by: Daleen Bo on: 10/10/2022 04:55 PM   Modules accepted: Orders

## 2022-10-17 ENCOUNTER — Ambulatory Visit (HOSPITAL_BASED_OUTPATIENT_CLINIC_OR_DEPARTMENT_OTHER): Payer: 59 | Attending: Orthopaedic Surgery

## 2022-10-17 ENCOUNTER — Encounter (HOSPITAL_BASED_OUTPATIENT_CLINIC_OR_DEPARTMENT_OTHER): Payer: Self-pay

## 2022-10-17 DIAGNOSIS — M25512 Pain in left shoulder: Secondary | ICD-10-CM | POA: Diagnosis present

## 2022-10-17 DIAGNOSIS — M25612 Stiffness of left shoulder, not elsewhere classified: Secondary | ICD-10-CM | POA: Diagnosis present

## 2022-10-17 DIAGNOSIS — G8929 Other chronic pain: Secondary | ICD-10-CM | POA: Diagnosis present

## 2022-10-17 DIAGNOSIS — M6281 Muscle weakness (generalized): Secondary | ICD-10-CM | POA: Insufficient documentation

## 2022-10-17 NOTE — Therapy (Signed)
OUTPATIENT PHYSICAL THERAPY SHOULDER TREATMENT   Patient Name: Micheal Mckinney MRN: [id number] DOB:1971-04-08, 52 y.o., male Today's Date: 10/17/2022  END OF SESSION:  PT End of Session - 10/17/22 1607     Visit Number 10    Number of Visits 25    Date for PT Re-Evaluation 09/20/22    Authorization Type UHC    Authorization Time Period 07/25/22 to 10/04/22    PT Start Time 1601    PT Stop Time 1642    PT Time Calculation (min) 41 min    Activity Tolerance Patient tolerated treatment well    Behavior During Therapy [facility] for tasks assessed/performed                 Past Medical History:  Diagnosis Date   Allergy    hay fever   Foreign body (FB) in soft tissue    right palm   GERD (gastroesophageal reflux disease)    History of chicken pox    Hyperlipidemia    Hypertension    OSA (obstructive sleep apnea) 01/26/2012   does not use CPAP   Past Surgical History:  Procedure Laterality Date   CARPAL TUNNEL RELEASE Bilateral 05/04/2013   CERVICAL FUSION     Alternative to this done in 05/2017, disc replacement   FOREIGN BODY REMOVAL Right 05/18/2018   Procedure: REMOVAL FOREIGN BODY RIGHT PALM;  Surgeon: Leanora Cover, MD;  Location: [place];  Service: Orthopedics;  Laterality: Right;   IRRIGATION AND DEBRIDEMENT SEBACEOUS CYST  2009    on back and neck   SHOULDER ARTHROSCOPY WITH ROTATOR CUFF REPAIR AND OPEN BICEPS TENODESIS Left 07/23/2022   Procedure: LEFT SHOULDER ARTHROSCOPY WITH ROTATOR CUFF REPAIR AND BICEPS TENODESIS;  Surgeon: Vanetta Mulders, MD;  Location: [place];  Service: Orthopedics;  Laterality: Left;   Patient Active Problem List   Diagnosis Date Noted   Nontraumatic complete tear of left rotator cuff 07/23/2022   Biceps tendinitis of left upper extremity 07/23/2022   Visit for preventive health examination 10/31/2015   Acute drug-induced gout of right foot 08/15/2015   Hyperlipidemia 05/10/2013   Routine general  medical examination at a health care facility 05/07/2013   Carpal tunnel syndrome 01/12/2013   HTN (hypertension) 01/26/2012   OSA (obstructive sleep apnea) 01/26/2012   Obesity 01/26/2012    PCP: Merri Ray MD   REFERRING PROVIDER: Vanetta Mulders, MD  REFERRING DIAG:  [phone] (ICD-10-CM) - Nontraumatic complete tear of left rotator cuff    THERAPY DIAG:  Chronic left shoulder pain  Muscle weakness (generalized)  Stiffness of left shoulder, not elsewhere classified  Rationale for Evaluation and Treatment: Rehabilitation  ONSET DATE:  07/23/22 DOS   Days since surgery: 86  SUBJECTIVE:  SUBJECTIVE STATEMENT:  Pt reports no complaints in L shoulder at entry.   Eval: Had surgery 07/23/22 for shoulder scope, RCR, biceps work too. Haven't moved shoulder much I don't know what to do with it. BP was getting a bit high yesterday,  I stopped taking pain meds bc of this yesterday. I'm not feeling well today honestly.   PERTINENT HISTORY: FROM MD NOTE 51 year old male with left shoulder subscapularis tear as well as biceps tendinitis.  At this time he is trialed multiple months of the band strengthening program as well as multiple injections into both shoulders.  He is not getting any persistent relief from his last injection in the left shoulder.  That effect I do believe that he would potentially be a candidate for arthroscopy with subscapularis repair and biceps tenodesis.  We discussed the limitations and restrictions associated with this.  We discussed the complications and possible outcomes associated with this.  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  He understands all of this.  He would like to proceed with left shoulder arthroscopy. Plan :     -Plan  for left shoulder arthroscopy with subscapularis repair and biceps tenodesis  POSTOPERATIVE PLAN: He will follow the subscapularis and biceps tendon rehab protocol.  He may begin early active flexion in the scapular plane.  He will avoid active internal rotation or resisted external rotation.  He will follow-up in 2 weeks for suture removal.  He will be placed on aspirin for blood clot prevention.  PAIN:  Are you having pain? Yes: NPRS scale: "0.5/10 Pain location: L shoulder in general  Pain description: dull pain  Aggravating factors: unsure  Relieving factors: unsure   PRECAUTIONS: Other: post op protocol   WEIGHT BEARING RESTRICTIONS: Yes NWB surgical UE   FALLS:  Has patient fallen in last 6 months? 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 Sammuel Hines in about 2 weeks   OBJECTIVE:   UPPER EXTREMITY ROM:   AROM scaption to 75 deg  Passive ROM Right eval Left eval L 1/18 L  3/18  AROM  Shoulder flexion  95* 95 154  Shoulder extension    45  Shoulder abduction  Scaption PROM 100* 80 140  Shoulder adduction      Shoulder internal rotation  Full IR PROM  with elbow at 0* ABD   Full L2  Shoulder external rotation  Approximately 30* at 0* ABD  neutral 40  Elbow flexion      Elbow extension      Wrist flexion      Wrist extension      Wrist ulnar deviation      Wrist radial deviation      Wrist pronation      Wrist supination      (Blank rows = not tested)  MMT Right 3/18  Left 3/18  Shoulder flexion 4 4-  Shoulder extension    Shoulder  abduction 4 4-  Shoulder adduction    Shoulder internal rotation 4 4-  Shoulder external rotation  4 4-  (Blank rows = not tested)   Full break test no performed due to healing precautions but pt able to maintain shoulder position with light resistance for 3s  TODAY'S TREATMENT:                                                                                                                                         DATE:   4/4  PROM L shoulder  Supine shoulder flexion 3# 2x10  Standing flexion 2# x10, 3#x10 Standing abduciton 3# x10  IR stretch behind back-active- 10seconds x10  Ball rolls at wall 4 way (shoulder height and OH) 4# 2.2# ball- x20 ea  TB IR/ER GTB 2x15ea TB row BTB x30 Bent over row 9# 3x10 Bicep curls 6# x30  Waiter's carry 3# down hall and back Bicep curl to [facility] press 3# 2x10 Wall stick ups Wall push ups 2x10  3/28  PROM L shoulder   Standing flexion 2# 2x8 Standing abduciton 2# 2x8  IR stretch behind back-active- 10seconds x10  Ball rolls at wall 4 way (shoulder height) 2.2# ball- x20 ea Wall clocks- RTB 1x5ea TB IR/ER RTB 2x10ea TB row GTB x30 TB extension RTB x30 Bent over row 8# 3x10 Bicep curls 5# x30  3/18  Inf joint mobs grade III- L UE  Pec stretch 30s 3x  RTB row 3x10 RTB ER 3x10 Biceps stretch at table 30s 3x Scaption 1lb 3x8  PATIENT EDUCATION: Education details: anatomy, exercise progression, DOMS expectations, envelope of function, HEP, POC  Person educated: Patient Education method: Explanation, Demonstration, and Handouts Education comprehension: verbalized understanding, returned demonstration, and needs further education  HOME EXERCISE PROGRAM:   Access Code: [email or web address] URL: [email or web address] Date: 08/01/2022 Prepared by: Daleen Bo  ASSESSMENT:  CLINICAL IMPRESSION: Pt able to again progress with strengthening today. No c/o pain during session, but did have appropriate fatigue level with  strength and endurance exercises. Especially challenged by weighted ball rolls. Able to initiate wall push ups for WB strengthening today which was non-painful. Mild discrepancy with 90/90 ER noted with wall stick ups, but overall good mobility with this. Will continue to progress functional activity as tolerated.    OBJECTIVE IMPAIRMENTS: decreased ROM, decreased strength, hypomobility, increased edema, increased fascial restrictions, increased muscle spasms, impaired flexibility, postural dysfunction, obesity, and pain.   ACTIVITY LIMITATIONS: carrying, lifting, bathing, toileting, dressing, reach over head, hygiene/grooming, and caring for others  PARTICIPATION LIMITATIONS: driving, shopping, community activity, and occupation  PERSONAL FACTORS: Behavior pattern, Education, Fitness, and Past/current experiences are also affecting patient's functional outcome.   REHAB POTENTIAL: Good  CLINICAL DECISION MAKING: Stable/uncomplicated  EVALUATION COMPLEXITY: Low   GOALS: Goals reviewed with patient? Yes  SHORT TERM GOALS: Target date: 08/23/2022    Will be compliant with appropriate progressive HEP as appropriate post-op Baseline: Goal status: MET 08/19/22  2.  Surgical shoulder AROM and PROM to be full without  increased pain  Baseline:  Goal status: ongoing  3.  Pain in surgical shoulder to be no more than 2/10 with functional activities in PT and HEP  Baseline:  Goal status: met  4.  Will have better understanding of postural liimitations and general ergonomics  Baseline:  Goal status: met    LONG TERM GOALS: Target date: 09/20/2022    MMT in surgical shoulder to be at least 4/5 in all tested groups  Baseline:  Goal status: ongoing  2.  Will have been able to return to work on limited duty basis as deemed appropriate by MD without increase in pain  Baseline:  Goal status: ongoing  3.  Will be able to reach overhead to pull a fan or light switch with surgical UE without  increase in pain Baseline:  Goal status: ongoing  4.  Pain in surgical shoulder with functional activities at home and work to be no more than 2/10 Baseline:  Goal status: ongoing    PLAN:  PT FREQUENCY:  2-3x/week   PT DURATION: 8 weeks  PLANNED INTERVENTIONS: Therapeutic exercises, Therapeutic activity, Neuromuscular re-education, Balance training, Gait training, Patient/Family education, Self Care, Joint mobilization, Aquatic Therapy, Dry Needling, Electrical stimulation, Cryotherapy, Moist heat, Taping, Ultrasound, Ionotophoresis 4mg /ml Dexamethasone, Manual therapy, and Re-evaluation  PLAN FOR NEXT SESSION: per protocol (biceps tendon protocol per op note)  Sherlynn Carbon, PTA  10/17/22 4:53 PM

## 2022-10-18 ENCOUNTER — Encounter: Payer: 59 | Admitting: Family Medicine

## 2022-10-22 ENCOUNTER — Encounter (HOSPITAL_BASED_OUTPATIENT_CLINIC_OR_DEPARTMENT_OTHER): Payer: 59 | Admitting: Physical Therapy

## 2022-10-24 ENCOUNTER — Encounter (HOSPITAL_BASED_OUTPATIENT_CLINIC_OR_DEPARTMENT_OTHER): Payer: 59 | Admitting: Physical Therapy

## 2022-10-25 ENCOUNTER — Ambulatory Visit (INDEPENDENT_AMBULATORY_CARE_PROVIDER_SITE_OTHER): Payer: 59 | Admitting: Orthopaedic Surgery

## 2022-10-25 DIAGNOSIS — M75122 Complete rotator cuff tear or rupture of left shoulder, not specified as traumatic: Secondary | ICD-10-CM

## 2022-10-25 NOTE — Progress Notes (Signed)
Post Operative Evaluation    Procedure/Date of Surgery: Left shoulder arthroscopy with rotator cuff debridement biceps tenodesis 07/23/22  Interval History:   Presents today for follow-up status post the above procedure.  He is continuing to improve.  He did feel great until 1 day prior.  There is a sudden twinge at 90 degrees forward elevation.  There is some scapular winging as well on the side.  PMH/PSH/Family History/Social History/Meds/Allergies:    Past Medical History:  Diagnosis Date   Allergy    hay fever   Foreign body (FB) in soft tissue    right palm   GERD (gastroesophageal reflux disease)    History of chicken pox    Hyperlipidemia    Hypertension    OSA (obstructive sleep apnea) 01/26/2012   does not use CPAP   Past Surgical History:  Procedure Laterality Date   CARPAL TUNNEL RELEASE Bilateral 05/04/2013   CERVICAL FUSION     Alternative to this done in 05/2017, disc replacement   FOREIGN BODY REMOVAL Right 05/18/2018   Procedure: REMOVAL FOREIGN BODY RIGHT PALM;  Surgeon: Betha Loa, MD;  Location: [facility];  Service: Orthopedics;  Laterality: Right;   IRRIGATION AND DEBRIDEMENT SEBACEOUS CYST  2009    on back and neck   SHOULDER ARTHROSCOPY WITH ROTATOR CUFF REPAIR AND OPEN BICEPS TENODESIS Left 07/23/2022   Procedure: LEFT SHOULDER ARTHROSCOPY WITH ROTATOR CUFF REPAIR AND BICEPS TENODESIS;  Surgeon: Huel Cote, MD;  Location: [facility];  Service: Orthopedics;  Laterality: Left;   Social History   Socioeconomic History   Marital status: Married    Spouse name: Not on file   Number of children: Not on file   Years of education: Not on file   Highest education level: Not on file  Occupational History   Not on file  Tobacco Use   Smoking status: Former    Packs/day: 0.25    Years: 25.00    Additional pack years: 0.00    Total pack years: 6.25    Types: Cigarettes    Quit date:  01/16/2020    Years since quitting: 2.7   Smokeless tobacco: Never   Tobacco comments:    Chewing tobacco  Vaping Use   Vaping Use: Never used  Substance and Sexual Activity   Alcohol use: No    Alcohol/week: 0.0 standard drinks of alcohol   Drug use: No   Sexual activity: Yes  Other Topics Concern   Not on file  Social History Narrative   Lives with wife and dogs   Social Determinants of Health   Financial Resource Strain: Not on file  Food Insecurity: Not on file  Transportation Needs: Not on file  Physical Activity: Not on file  Stress: Not on file  Social Connections: Not on file   Family History  Problem Relation Age of Onset   Cancer Mother        breast   Stroke Father    Allergies  Allergen Reactions   Chlorhexidine Dermatitis   Current Outpatient Medications  Medication Sig Dispense Refill   aspirin EC 325 MG tablet Take 1 tablet (325 mg total) by mouth daily. 30 tablet 0   atenolol (TENORMIN) 50 MG tablet Take 1 tablet (50 mg total) by mouth daily. 90 tablet 3  cyclobenzaprine (FLEXERIL) 5 MG tablet Take 1 tablet (5 mg total) by mouth 3 (three) times daily as needed for muscle spasms (start qhs prn due to sedation). (Patient not taking: Reported on 03/21/2022) 15 tablet 1   ezetimibe (ZETIA) 10 MG tablet TAKE ONE TABLET BY MOUTH DAILY 90 tablet 3   methylPREDNISolone (MEDROL DOSEPAK) 4 MG TBPK tablet Take per packet instructions (Patient not taking: Reported on 03/21/2022) 21 tablet 0   oxyCODONE (OXY IR/ROXICODONE) 5 MG immediate release tablet Take 1 tablet (5 mg total) by mouth every 4 (four) hours as needed (severe pain). 10 tablet 0   ranitidine (ZANTAC) 150 MG tablet Take 1 tablet by mouth as needed.     rosuvastatin (CRESTOR) 5 MG tablet Take 1 tablet (5 mg total) by mouth daily. 90 tablet 3   telmisartan (MICARDIS) 40 MG tablet Take 1 tablet (40 mg total) by mouth daily. 90 tablet 3   No current facility-administered medications for this visit.   No  results found.  Review of Systems:   A ROS was performed including pertinent positives and negatives as documented in the HPI.   Musculoskeletal Exam:    There were no vitals taken for this visit.  Left shoulder incisions are well healing without erythema or drainage.  In the standing position he is able to forward elevate to 165 degrees although external rotation at the side is to 60 compared to 60 on the other side.  Internal rotation is to T12 today.  Internal rotation deferred today.  Remainder of distal neurosensory exam is intact  Imaging:      I personally reviewed and interpreted the radiographs.   Assessment:   12 weeks status post left shoulder rotator cuff debridement and biceps tenodesis overall doing extremely well.  I did describe that this type of pain is likely occurring from a dynamic impingement phenomenon with overhead motion in the setting of posterior scapular weakness.  I have given him a series of exercises rows which she can work on to strengthen his posterior chain.  I did also describe that this does tend to fluctuate with weather and will likely improve over the course of the next month or 2.  I will plan to see him back in 3 months for reassessment tomorrow Plan :    -Return to clinic in 12 weeks for reassessment        I personally saw and evaluated the patient, and participated in the management and treatment plan.  Huel Cote, MD Attending Physician, Orthopedic Surgery  This document was dictated using Dragon voice recognition software. A reasonable attempt at proof reading has been made to minimize errors.

## 2022-11-13 ENCOUNTER — Ambulatory Visit (INDEPENDENT_AMBULATORY_CARE_PROVIDER_SITE_OTHER): Payer: 59 | Admitting: Family Medicine

## 2022-11-13 ENCOUNTER — Encounter: Payer: Self-pay | Admitting: Family Medicine

## 2022-11-13 VITALS — BP 128/68 | HR 65 | Temp 97.5°F | Ht 68.75 in | Wt 287.4 lb

## 2022-11-13 DIAGNOSIS — E785 Hyperlipidemia, unspecified: Secondary | ICD-10-CM | POA: Diagnosis not present

## 2022-11-13 DIAGNOSIS — Z130 Encounter for screening for diseases of the blood and blood-forming organs and certain disorders involving the immune mechanism: Secondary | ICD-10-CM

## 2022-11-13 DIAGNOSIS — Z0000 Encounter for general adult medical examination without abnormal findings: Secondary | ICD-10-CM

## 2022-11-13 DIAGNOSIS — Z125 Encounter for screening for malignant neoplasm of prostate: Secondary | ICD-10-CM | POA: Diagnosis not present

## 2022-11-13 DIAGNOSIS — I10 Essential (primary) hypertension: Secondary | ICD-10-CM

## 2022-11-13 LAB — CBC WITH DIFFERENTIAL/PLATELET
Basophils Absolute: 0.1 10*3/uL (ref 0.0–0.1)
Basophils Relative: 0.7 % (ref 0.0–3.0)
Eosinophils Absolute: 0 10*3/uL (ref 0.0–0.7)
Eosinophils Relative: 0.4 % (ref 0.0–5.0)
HCT: 43.6 % (ref 39.0–52.0)
Hemoglobin: 14.9 g/dL (ref 13.0–17.0)
Lymphocytes Relative: 28.8 % (ref 12.0–46.0)
Lymphs Abs: 2.4 10*3/uL (ref 0.7–4.0)
MCHC: 34.3 g/dL (ref 30.0–36.0)
MCV: 87.1 fl (ref 78.0–100.0)
Monocytes Absolute: 0.7 10*3/uL (ref 0.1–1.0)
Monocytes Relative: 8.9 % (ref 3.0–12.0)
Neutro Abs: 5.1 10*3/uL (ref 1.4–7.7)
Neutrophils Relative %: 61.2 % (ref 43.0–77.0)
Platelets: 220 10*3/uL (ref 150.0–400.0)
RBC: 5.01 Mil/uL (ref 4.22–5.81)
RDW: 14.2 % (ref 11.5–15.5)
WBC: 8.3 10*3/uL (ref 4.0–10.5)

## 2022-11-13 LAB — LIPID PANEL
Cholesterol: 116 mg/dL (ref 0–200)
HDL: 40 mg/dL (ref >39.00)
LDL Cholesterol: 67 mg/dL (ref 0–99)
NonHDL: 76.07
Total CHOL/HDL Ratio: 3
Triglycerides: 46 mg/dL (ref 0.0–149.0)
VLDL: 9.2 mg/dL (ref 0.0–40.0)

## 2022-11-13 LAB — PSA: PSA: 0.49 ng/mL (ref 0.10–4.00)

## 2022-11-13 LAB — COMPREHENSIVE METABOLIC PANEL
ALT: 26 U/L (ref 0–53)
AST: 25 U/L (ref 0–37)
Albumin: 4.4 g/dL (ref 3.5–5.2)
Alkaline Phosphatase: 48 U/L (ref 39–117)
BUN: 14 mg/dL (ref 6–23)
CO2: 25 mEq/L (ref 19–32)
Calcium: 10.4 mg/dL (ref 8.4–10.5)
Chloride: 105 mEq/L (ref 96–112)
Creatinine, Ser: 0.91 mg/dL (ref 0.40–1.50)
GFR: 97.2 mL/min (ref >60.00)
Glucose, Bld: 103 mg/dL — ABNORMAL HIGH (ref 70–99)
Potassium: 5.7 mEq/L — ABNORMAL HIGH (ref 3.5–5.1)
Sodium: 138 mEq/L (ref 135–145)
Total Bilirubin: 0.7 mg/dL (ref 0.2–1.2)
Total Protein: 6.8 g/dL (ref 6.0–8.3)

## 2022-11-13 MED ORDER — TELMISARTAN 40 MG PO TABS: 40.0000 mg | ORAL_TABLET | Freq: Every day | ORAL | 3 refills | Status: DC

## 2022-11-13 MED ORDER — ATENOLOL 50 MG PO TABS
50.0000 mg | ORAL_TABLET | Freq: Every day | ORAL | 3 refills | Status: DC
Start: 2022-11-13 — End: 2022-12-10

## 2022-11-13 MED ORDER — ROSUVASTATIN CALCIUM 5 MG PO TABS
5.0000 mg | ORAL_TABLET | Freq: Every day | ORAL | 3 refills | Status: DC
Start: 2022-11-13 — End: 2022-12-10

## 2022-11-13 NOTE — Patient Instructions (Addendum)
Thanks for coming in today. See info on staying healthy below.  I do recommend meeting with a dentist when possible for routine screening.  No med changes today. Take care.   Preventive Care 80-52 Years Old, Male Preventive care refers to lifestyle choices and visits with your health care provider that can promote health and wellness. Preventive care visits are also called wellness exams. What can I expect for my preventive care visit? Counseling During your preventive care visit, your health care provider may ask about your: Medical history, including: Past medical problems. Family medical history. Current health, including: Emotional well-being. Home life and relationship well-being. Sexual activity. Lifestyle, including: Alcohol, nicotine or tobacco, and drug use. Access to firearms. Diet, exercise, and sleep habits. Safety issues such as seatbelt and bike helmet use. Sunscreen use. Work and work Astronomer. Physical exam Your health care provider will check your: Height and weight. These may be used to calculate your BMI (body mass index). BMI is a measurement that tells if you are at a healthy weight. Waist circumference. This measures the distance around your waistline. This measurement also tells if you are at a healthy weight and may help predict your risk of certain diseases, such as type 2 diabetes and high blood pressure. Heart rate and blood pressure. Body temperature. Skin for abnormal spots. What immunizations do I need?  Vaccines are usually given at various ages, according to a schedule. Your health care provider will recommend vaccines for you based on your age, medical history, and lifestyle or other factors, such as travel or where you work. What tests do I need? Screening Your health care provider may recommend screening tests for certain conditions. This may include: Lipid and cholesterol levels. Diabetes screening. This is done by checking your blood sugar  (glucose) after you have not eaten for a while (fasting). Hepatitis B test. Hepatitis C test. HIV (human immunodeficiency virus) test. STI (sexually transmitted infection) testing, if you are at risk. Lung cancer screening. Prostate cancer screening. Colorectal cancer screening. Talk with your health care provider about your test results, treatment options, and if necessary, the need for more tests. Follow these instructions at home: Eating and drinking  Eat a diet that includes fresh fruits and vegetables, whole grains, lean protein, and low-fat dairy products. Take vitamin and mineral supplements as recommended by your health care provider. Do not drink alcohol if your health care provider tells you not to drink. If you drink alcohol: Limit how much you have to 0-2 drinks a day. Know how much alcohol is in your drink. In the [place] one drink equals one 12 oz bottle of beer (355 mL), one 5 oz glass of wine (148 mL), or one 1 oz glass of hard liquor (44 mL). Lifestyle Brush your teeth every morning and night with fluoride toothpaste. Floss one time each day. Exercise for at least 30 minutes 5 or more days each week. Do not use any products that contain nicotine or tobacco. These products include cigarettes, chewing tobacco, and vaping devices, such as e-cigarettes. If you need help quitting, ask your health care provider. Do not use drugs. If you are sexually active, practice safe sex. Use a condom or other form of protection to prevent STIs. Take aspirin only as told by your health care provider. Make sure that you understand how much to take and what form to take. Work with your health care provider to find out whether it is safe and beneficial for you to take aspirin daily.  Find healthy ways to manage stress, such as: Meditation, yoga, or listening to music. Journaling. Talking to a trusted person. Spending time with friends and family. Minimize exposure to UV radiation to reduce  your risk of skin cancer. Safety Always wear your seat belt while driving or riding in a vehicle. Do not drive: If you have been drinking alcohol. Do not ride with someone who has been drinking. When you are tired or distracted. While texting. If you have been using any mind-altering substances or drugs. Wear a helmet and other protective equipment during sports activities. If you have firearms in your house, make sure you follow all gun safety procedures. What's next? Go to your health care provider once a year for an annual wellness visit. Ask your health care provider how often you should have your eyes and teeth checked. Stay up to date on all vaccines. This information is not intended to replace advice given to you by your health care provider. Make sure you discuss any questions you have with your health care provider. Document Revised: 12/27/2020 Document Reviewed: 12/27/2020 Elsevier Patient Education  2023 [employer].

## 2022-11-13 NOTE — Progress Notes (Signed)
Subjective:  Patient ID: Micheal Mckinney, male    DOB: 08/28/1970  Age: 52 y.o. MRN: [id number]  CC:  Chief Complaint  Patient presents with   Annual Exam    Pt doing okay, pt is fasting     HPI Micheal Mckinney presents for Annual Exam Status post left shoulder arthroscopy for rotator cuff tear, Dr. Steward Drone, in January. Doing better.  Work has been busy.  Doing well - no other new concerns.   Hypertension: With history of OSA, unable to tolerate CPAP.  Hypertension treated with telmisartan, atenolol, cardiology Dr. Rennis Mckinney. Has been sleeping on side - sleeps better.  No new med side effects. Home readings: rare - stable when checked recently.  BP Readings from Last 3 Encounters:  11/13/22 128/68  07/23/22 125/70  03/21/22 138/70   Lab Results  Component Value Date   CREATININE 0.67 11/16/2021   Hyperlipidemia: Elevated coronary calcium scoring, treated with Crestor, Zetia, target LDL of 70 or lower.  Followed by lipid clinic, Dr. Rennis Mckinney.  Last LDL in May 2023.  No new side effects/myalgias.  Lab Results  Component Value Date   CHOL 95 11/16/2021   HDL 28.10 (L) 11/16/2021   LDLCALC 55 11/16/2021   LDLDIRECT 169.6 04/13/2014   TRIG 61.0 11/16/2021   CHOLHDL 3 11/16/2021   Lab Results  Component Value Date   ALT 23 11/16/2021   AST 18 11/16/2021   ALKPHOS 61 11/16/2021   BILITOT 0.4 11/16/2021   GERD Treated with Zantac as needed and adjustment of mealtimes. Only used if needed - once per week - known trigger - late meal.    Prediabetes with obesity Diet/exercise approach, normal A1c last May.  Weight has improved from 318 in 2022. Lab Results  Component Value Date   HGBA1C 5.5 11/16/2021   Wt Readings from Last 3 Encounters:  11/13/22 287 lb 6.4 oz (130.4 kg)  07/23/22 281 lb 12 oz (127.8 kg)  03/21/22 289 lb (131.1 kg)  Body mass index is 42.75 kg/m.        11/13/2022    8:47 AM 03/21/2022    9:30 AM 03/13/2022    9:18 AM 11/16/2021    8:50 AM  05/18/2021    9:20 AM  Depression screen PHQ 2/9  Decreased Interest 0 0 0 0 0  Down, Depressed, Hopeless 0 0 0 0 0  PHQ - 2 Score 0 0 0 0 0  Altered sleeping 0 0 0 0   Tired, decreased energy 0 0 0 0   Change in appetite 0 0 0 0   Feeling bad or failure about yourself  0 0 0 0   Trouble concentrating 0 0 0 0   Moving slowly or fidgety/restless 0 0 0 0   Suicidal thoughts 0 0 0 0   PHQ-9 Score 0 0 0 0   Difficult doing work/chores    Not difficult at all     Health Maintenance  Topic Date Due   COVID-19 Vaccine (1) Never done   HIV Screening  Never done   Hepatitis C Screening  Never done   Zoster Vaccines- Shingrix (1 of 2) Never done   COLONOSCOPY (Pts 45-69yrs Insurance coverage will need to be confirmed)  11/17/2022 (Originally 10/17/2015)   INFLUENZA VACCINE  02/13/2023   DTaP/Tdap/Td (3 - Td or Tdap) 04/07/2028   HPV VACCINES  Aged Out  Screening options with colonoscopy versus Cologuard discussed. Discussed timing of repeat testing intervals if normal, as well  as potential need for diagnostic Colonoscopy if positive Cologuard. Understanding expressed, and chose Cologuard prior - kit ruined in rain. Repeat kit ordered.  Prostate: does not have family history of prostate cancer.  The natural history of prostate cancer and ongoing controversy regarding screening and potential treatment outcomes of prostate cancer has been discussed with the patient. The meaning of a false positive PSA and a false negative PSA has been discussed. He indicates understanding of the limitations of this screening test and wishes to proceed with screening PSA testing. Lab Results  Component Value Date   PSA 0.27 01/12/2019    Immunization History  Administered Date(s) Administered   Influenza,inj,Quad PF,6+ Mos 05/07/2013, 04/13/2014, 04/28/2015, 08/12/2016, 10/03/2017, 04/07/2018   Td 01/23/2009   Tdap 04/07/2018  No covid vaccines. Has done well without.  Flu vaccine - recommended in fall.    No results found. Optho appt this week.   Dental: none.   Alcohol: rare.   Tobacco: occasional smokeless tobacco - rarely.   Exercise: physical work. Some increased walking recently.    History Patient Active Problem List   Diagnosis Date Noted   Nontraumatic complete tear of left rotator cuff 07/23/2022   Biceps tendinitis of left upper extremity 07/23/2022   Visit for preventive health examination 10/31/2015   Acute drug-induced gout of right foot 08/15/2015   Hyperlipidemia 05/10/2013   Routine general medical examination at a health care facility 05/07/2013   Carpal tunnel syndrome 01/12/2013   HTN (hypertension) 01/26/2012   OSA (obstructive sleep apnea) 01/26/2012   Obesity 01/26/2012   Past Medical History:  Diagnosis Date   Allergy    hay fever   Foreign body (FB) in soft tissue    right palm   GERD (gastroesophageal reflux disease)    History of chicken pox    Hyperlipidemia    Hypertension    OSA (obstructive sleep apnea) 01/26/2012   does not use CPAP   Past Surgical History:  Procedure Laterality Date   CARPAL TUNNEL RELEASE Bilateral 05/04/2013   CERVICAL FUSION     Alternative to this done in 05/2017, disc replacement   FOREIGN BODY REMOVAL Right 05/18/2018   Procedure: REMOVAL FOREIGN BODY RIGHT PALM;  Surgeon: Micheal Loa, MD;  Location: [facility];  Service: Orthopedics;  Laterality: Right;   IRRIGATION AND DEBRIDEMENT SEBACEOUS CYST  2009    on back and neck   SHOULDER ARTHROSCOPY WITH ROTATOR CUFF REPAIR AND OPEN BICEPS TENODESIS Left 07/23/2022   Procedure: LEFT SHOULDER ARTHROSCOPY WITH ROTATOR CUFF REPAIR AND BICEPS TENODESIS;  Surgeon: Micheal Cote, MD;  Location: [facility];  Service: Orthopedics;  Laterality: Left;   Allergies  Allergen Reactions   Chlorhexidine Dermatitis   Prior to Admission medications   Medication Sig Start Date End Date Taking? Authorizing Provider  aspirin EC 325 MG tablet Take  1 tablet (325 mg total) by mouth daily. Patient taking differently: Take 81 mg by mouth daily. 06/28/22  Yes Micheal Cote, MD  atenolol (TENORMIN) 50 MG tablet Take 1 tablet (50 mg total) by mouth daily. 11/16/21  Yes Shade Flood, MD  ezetimibe (ZETIA) 10 MG tablet TAKE ONE TABLET BY MOUTH DAILY 09/26/22  Yes Shade Flood, MD  ranitidine (ZANTAC) 150 MG tablet Take 1 tablet by mouth as needed.   Yes [provider]  telmisartan (MICARDIS) 40 MG tablet Take 1 tablet (40 mg total) by mouth daily. 11/16/21  Yes Shade Flood, MD  rosuvastatin (CRESTOR) 5 MG  tablet Take 1 tablet (5 mg total) by mouth daily. 11/16/21 11/11/22  Shade Flood, MD   Social History   Socioeconomic History   Marital status: Married    Spouse name: Not on file   Number of children: Not on file   Years of education: Not on file   Highest education level: Not on file  Occupational History   Not on file  Tobacco Use   Smoking status: Former    Packs/day: 0.25    Years: 25.00    Additional pack years: 0.00    Total pack years: 6.25    Types: Cigarettes    Quit date: 01/16/2020    Years since quitting: 2.8   Smokeless tobacco: Current   Tobacco comments:    Chewing tobacco  Vaping Use   Vaping Use: Never used  Substance and Sexual Activity   Alcohol use: No    Alcohol/week: 0.0 standard drinks of alcohol   Drug use: No   Sexual activity: Yes  Other Topics Concern   Not on file  Social History Narrative   Lives with wife and dogs   Social Determinants of Health   Financial Resource Strain: Not on file  Food Insecurity: Not on file  Transportation Needs: Not on file  Physical Activity: Not on file  Stress: Not on file  Social Connections: Not on file  Intimate Partner Violence: Not on file    Review of Systems  13 point review of systems per patient health survey noted.  Negative other than as indicated above or in HPI.    Objective:   Vitals:   11/13/22 0850  BP:  128/68  Pulse: 65  Temp: (!) 97.5 F (36.4 C)  TempSrc: Temporal  SpO2: 98%  Weight: 287 lb 6.4 oz (130.4 kg)  Height: 5' 8.75" (1.746 m)     Physical Exam Vitals reviewed.  Constitutional:      Appearance: He is well-developed.  HENT:     Head: Normocephalic and atraumatic.     Right Ear: External ear normal.     Left Ear: External ear normal.  Eyes:     Conjunctiva/sclera: Conjunctivae normal.     Pupils: Pupils are equal, round, and reactive to light.  Neck:     Thyroid: No thyromegaly.  Cardiovascular:     Rate and Rhythm: Normal rate and regular rhythm.     Heart sounds: Normal heart sounds.  Pulmonary:     Effort: Pulmonary effort is normal. No respiratory distress.     Breath sounds: Normal breath sounds. No wheezing.  Abdominal:     General: There is no distension.     Palpations: Abdomen is soft.     Tenderness: There is no abdominal tenderness.  Musculoskeletal:        General: No tenderness. Normal range of motion.     Cervical back: Normal range of motion and neck supple.  Lymphadenopathy:     Cervical: No cervical adenopathy.  Skin:    General: Skin is warm and dry.  Neurological:     Mental Status: He is alert and oriented to person, place, and time.     Deep Tendon Reflexes: Reflexes are normal and symmetric.  Psychiatric:        Behavior: Behavior normal.        Assessment & Plan:  MCKENZY EALES is a 52 y.o. male . Annual physical exam - Plan: CBC with Differential/Platelet, Comprehensive metabolic panel, Lipid panel, PSA  - -anticipatory guidance as  below in AVS, screening labs above. Health maintenance items as above in HPI discussed/recommended as applicable.   Essential hypertension - Plan: Comprehensive metabolic panel, atenolol (TENORMIN) 50 MG tablet, telmisartan (MICARDIS) 40 MG tablet  -  Stable, tolerating current regimen. Medications refilled. Labs pending as above.   Hyperlipidemia, unspecified hyperlipidemia type - Plan:  Comprehensive metabolic panel, Lipid panel, rosuvastatin (CRESTOR) 5 MG tablet  -  Stable, tolerating current regimen. Medications refilled. Labs pending as above, adjustment of plan depending on results.  Followed by lipid clinic.   Screening for deficiency anemia - Plan: CBC with Differential/Platelet  Screening for prostate cancer - Plan: PSA   No orders of the defined types were placed in this encounter.  Patient Instructions  Thanks for coming in today. See info on staying healthy below.  I do recommend meeting with a dentist when possible for routine screening.  No med changes today. Take care.   Preventive Care 23-11 Years Old, Male Preventive care refers to lifestyle choices and visits with your health care provider that can promote health and wellness. Preventive care visits are also called wellness exams. What can I expect for my preventive care visit? Counseling During your preventive care visit, your health care provider may ask about your: Medical history, including: Past medical problems. Family medical history. Current health, including: Emotional well-being. Home life and relationship well-being. Sexual activity. Lifestyle, including: Alcohol, nicotine or tobacco, and drug use. Access to firearms. Diet, exercise, and sleep habits. Safety issues such as seatbelt and bike helmet use. Sunscreen use. Work and work Astronomer. Physical exam Your health care provider will check your: Height and weight. These may be used to calculate your BMI (body mass index). BMI is a measurement that tells if you are at a healthy weight. Waist circumference. This measures the distance around your waistline. This measurement also tells if you are at a healthy weight and may help predict your risk of certain diseases, such as type 2 diabetes and high blood pressure. Heart rate and blood pressure. Body temperature. Skin for abnormal spots. What immunizations do I need?  Vaccines  are usually given at various ages, according to a schedule. Your health care provider will recommend vaccines for you based on your age, medical history, and lifestyle or other factors, such as travel or where you work. What tests do I need? Screening Your health care provider may recommend screening tests for certain conditions. This may include: Lipid and cholesterol levels. Diabetes screening. This is done by checking your blood sugar (glucose) after you have not eaten for a while (fasting). Hepatitis B test. Hepatitis C test. HIV (human immunodeficiency virus) test. STI (sexually transmitted infection) testing, if you are at risk. Lung cancer screening. Prostate cancer screening. Colorectal cancer screening. Talk with your health care provider about your test results, treatment options, and if necessary, the need for more tests. Follow these instructions at home: Eating and drinking  Eat a diet that includes fresh fruits and vegetables, whole grains, lean protein, and low-fat dairy products. Take vitamin and mineral supplements as recommended by your health care provider. Do not drink alcohol if your health care provider tells you not to drink. If you drink alcohol: Limit how much you have to 0-2 drinks a day. Know how much alcohol is in your drink. In the [place] one drink equals one 12 oz bottle of beer (355 mL), one 5 oz glass of wine (148 mL), or one 1 oz glass of hard liquor (44 mL). Lifestyle  Brush your teeth every morning and night with fluoride toothpaste. Floss one time each day. Exercise for at least 30 minutes 5 or more days each week. Do not use any products that contain nicotine or tobacco. These products include cigarettes, chewing tobacco, and vaping devices, such as e-cigarettes. If you need help quitting, ask your health care provider. Do not use drugs. If you are sexually active, practice safe sex. Use a condom or other form of protection to prevent STIs. Take  aspirin only as told by your health care provider. Make sure that you understand how much to take and what form to take. Work with your health care provider to find out whether it is safe and beneficial for you to take aspirin daily. Find healthy ways to manage stress, such as: Meditation, yoga, or listening to music. Journaling. Talking to a trusted person. Spending time with friends and family. Minimize exposure to UV radiation to reduce your risk of skin cancer. Safety Always wear your seat belt while driving or riding in a vehicle. Do not drive: If you have been drinking alcohol. Do not ride with someone who has been drinking. When you are tired or distracted. While texting. If you have been using any mind-altering substances or drugs. Wear a helmet and other protective equipment during sports activities. If you have firearms in your house, make sure you follow all gun safety procedures. What's next? Go to your health care provider once a year for an annual wellness visit. Ask your health care provider how often you should have your eyes and teeth checked. Stay up to date on all vaccines. This information is not intended to replace advice given to you by your health care provider. Make sure you discuss any questions you have with your health care provider. Document Revised: 12/27/2020 Document Reviewed: 12/27/2020 Elsevier Patient Education  2023 Elsevier Inc.       Signed,   Meredith Staggers, MD [facility], [facility] 11/13/22 9:05 AM

## 2022-11-16 ENCOUNTER — Encounter: Payer: Self-pay | Admitting: Family Medicine

## 2022-11-19 ENCOUNTER — Other Ambulatory Visit: Payer: Self-pay | Admitting: Family Medicine

## 2022-11-19 DIAGNOSIS — E875 Hyperkalemia: Secondary | ICD-10-CM

## 2022-11-19 NOTE — Progress Notes (Signed)
See labs 

## 2022-12-08 ENCOUNTER — Other Ambulatory Visit: Payer: Self-pay | Admitting: Family Medicine

## 2022-12-08 DIAGNOSIS — E785 Hyperlipidemia, unspecified: Secondary | ICD-10-CM

## 2022-12-08 DIAGNOSIS — I10 Essential (primary) hypertension: Secondary | ICD-10-CM

## 2023-01-24 ENCOUNTER — Ambulatory Visit (HOSPITAL_BASED_OUTPATIENT_CLINIC_OR_DEPARTMENT_OTHER): Payer: 59 | Admitting: Orthopaedic Surgery

## 2023-01-31 ENCOUNTER — Ambulatory Visit (INDEPENDENT_AMBULATORY_CARE_PROVIDER_SITE_OTHER): Payer: 59 | Admitting: Orthopaedic Surgery

## 2023-01-31 DIAGNOSIS — M75122 Complete rotator cuff tear or rupture of left shoulder, not specified as traumatic: Secondary | ICD-10-CM | POA: Diagnosis not present

## 2023-01-31 NOTE — Progress Notes (Signed)
Post Operative Evaluation    Procedure/Date of Surgery: Left shoulder arthroscopy with rotator cuff debridement biceps tenodesis 07/23/22  Interval History:   Presents today for follow-up status post the above procedure.  Overall he is continuing to improve. He does have some soreness with limitation behind the back.  PMH/PSH/Family History/Social History/Meds/Allergies:    Past Medical History:  Diagnosis Date   Allergy    hay fever   Foreign body (FB) in soft tissue    right palm   GERD (gastroesophageal reflux disease)    History of chicken pox    Hyperlipidemia    Hypertension    OSA (obstructive sleep apnea) 01/26/2012   does not use CPAP   Past Surgical History:  Procedure Laterality Date   CARPAL TUNNEL RELEASE Bilateral 05/04/2013   CERVICAL FUSION     Alternative to this done in 05/2017, disc replacement   FOREIGN BODY REMOVAL Right 05/18/2018   Procedure: REMOVAL FOREIGN BODY RIGHT PALM;  Surgeon: Betha Loa, MD;  Location: [facility];  Service: Orthopedics;  Laterality: Right;   IRRIGATION AND DEBRIDEMENT SEBACEOUS CYST  2009    on back and neck   SHOULDER ARTHROSCOPY WITH ROTATOR CUFF REPAIR AND OPEN BICEPS TENODESIS Left 07/23/2022   Procedure: LEFT SHOULDER ARTHROSCOPY WITH ROTATOR CUFF REPAIR AND BICEPS TENODESIS;  Surgeon: Huel Cote, MD;  Location:  S[facility]ERY [facility];  Service: Orthopedics;  Laterality: Left;   Social History   Socioeconomic History   Marital status: Married    Spouse name: Not on file   Number of children: Not on file   Years of education: Not on file   Highest education level: Not on file  Occupational History   Not on file  Tobacco Use   Smoking status: Former    Current packs/day: 0.00    Average packs/day: 0.3 packs/day for 25.0 years (6.3 ttl pk-yrs)    Types: Cigarettes    Start date: 01/16/1995    Quit date: 01/16/2020    Years since quitting: 3.0   Smokeless  tobacco: Current   Tobacco comments:    Chewing tobacco  Vaping Use   Vaping status: Never Used  Substance and Sexual Activity   Alcohol use: No    Alcohol/week: 0.0 standard drinks of alcohol   Drug use: No   Sexual activity: Yes  Other Topics Concern   Not on file  Social History Narrative   Lives with wife and dogs   Social Determinants of Health   Financial Resource Strain: Not on file  Food Insecurity: Not on file  Transportation Needs: Not on file  Physical Activity: Not on file  Stress: Not on file  Social Connections: Unknown (11/26/2021)   [facility]   Social Network    Social Network: Not on file   Family History  Problem Relation Age of Onset   Cancer Mother        breast   Stroke Father    Allergies  Allergen Reactions   Chlorhexidine Dermatitis   Current Outpatient Medications  Medication Sig Dispense Refill   aspirin EC 325 MG tablet Take 1 tablet (325 mg total) by mouth daily. (Patient taking differently: Take 81 mg by mouth daily.) 30 tablet 0   atenolol (TENORMIN) 50 MG tablet TAKE ONE TABLET BY MOUTH  DAILY 30 tablet 6   ezetimibe (ZETIA) 10 MG tablet TAKE ONE TABLET BY MOUTH DAILY 90 tablet 3   ranitidine (ZANTAC) 150 MG tablet Take 1 tablet by mouth as needed.     rosuvastatin (CRESTOR) 5 MG tablet TAKE ONE TABLET BY MOUTH DAILY 30 tablet 6   telmisartan (MICARDIS) 40 MG tablet TAKE ONE TABLET BY MOUTH DAILY 90 tablet 3   No current facility-administered medications for this visit.   No results found.  Review of Systems:   A ROS was performed including pertinent positives and negatives as documented in the HPI.   Musculoskeletal Exam:    There were no vitals taken for this visit.  Left shoulder incisions are well healing without erythema or drainage.  In the standing position he is able to forward elevate to 165 degrees although external rotation at the side is to 60 compared to 60 on the other side.  Internal  rotation is to T12 today.  Remainder of distal neurosensory exam is intact  Imaging:      I personally reviewed and interpreted the radiographs.   Assessment:   6 months status post left shoulder rotator cuff debridement and biceps tenodesis overall doing well.  He does have some weakness with IR and some residual deficit I would like addressed with PT. I do believe that he would benefit for additional sessions. I will plan to see him back in 2 months for reassessment. Plan :    -Return to clinic in 8 weeks for reassessment        I personally saw and evaluated the patient, and participated in the management and treatment plan.  Huel Cote, MD Attending Physician, Orthopedic Surgery  This document was dictated using Dragon voice recognition software. A reasonable attempt at proof reading has been made to minimize errors.

## 2023-02-28 ENCOUNTER — Encounter (HOSPITAL_BASED_OUTPATIENT_CLINIC_OR_DEPARTMENT_OTHER): Payer: Self-pay | Admitting: Physical Therapy

## 2023-02-28 ENCOUNTER — Ambulatory Visit (HOSPITAL_BASED_OUTPATIENT_CLINIC_OR_DEPARTMENT_OTHER): Payer: 59 | Attending: Orthopaedic Surgery | Admitting: Physical Therapy

## 2023-02-28 DIAGNOSIS — M25612 Stiffness of left shoulder, not elsewhere classified: Secondary | ICD-10-CM | POA: Insufficient documentation

## 2023-02-28 DIAGNOSIS — M75122 Complete rotator cuff tear or rupture of left shoulder, not specified as traumatic: Secondary | ICD-10-CM | POA: Diagnosis not present

## 2023-02-28 DIAGNOSIS — G8929 Other chronic pain: Secondary | ICD-10-CM | POA: Insufficient documentation

## 2023-02-28 DIAGNOSIS — M25512 Pain in left shoulder: Secondary | ICD-10-CM | POA: Insufficient documentation

## 2023-02-28 DIAGNOSIS — M6281 Muscle weakness (generalized): Secondary | ICD-10-CM | POA: Insufficient documentation

## 2023-02-28 NOTE — Therapy (Addendum)
OUTPATIENT PHYSICAL THERAPY SHOULDER EVALUATION FOR RE-CERT   Patient Name: Micheal Mckinney MRN: [id number] DOB:Aug 02, 1970, 52 y.o., male Today's Date: 02/28/2023  END OF SESSION:  PT End of Session - 02/28/23 0842     Visit Number 11    Number of Visits 30    Date for PT Re-Evaluation 05/29/23    Authorization Type U[employer]    Authorization Time Period 07/25/22 to 10/04/22    PT Start Time 0802    PT Stop Time 0842    PT Time Calculation (min) 40 min    Activity Tolerance Patient tolerated treatment well    Behavior During Therapy [facility] for tasks assessed/performed                  Past Medical History:  Diagnosis Date   Allergy    hay fever   Foreign body (FB) in soft tissue    right palm   GERD (gastroesophageal reflux disease)    History of chicken pox    Hyperlipidemia    Hypertension    OSA (obstructive sleep apnea) 01/26/2012   does not use CPAP   Past Surgical History:  Procedure Laterality Date   CARPAL TUNNEL RELEASE Bilateral 05/04/2013   CERVICAL FUSION     Alternative to this done in 05/2017, disc replacement   FOREIGN BODY REMOVAL Right 05/18/2018   Procedure: REMOVAL FOREIGN BODY RIGHT PALM;  Surgeon: Betha Loa, MD;  Location: [facility];  Service: Orthopedics;  Laterality: Right;   IRRIGATION AND DEBRIDEMENT SEBACEOUS CYST  2009    on back and neck   SHOULDER ARTHROSCOPY WITH ROTATOR CUFF REPAIR AND OPEN BICEPS TENODESIS Left 07/23/2022   Procedure: LEFT SHOULDER ARTHROSCOPY WITH ROTATOR CUFF REPAIR AND BICEPS TENODESIS;  Surgeon: Huel Cote, MD;  Location: [facility];  Service: Orthopedics;  Laterality: Left;   Patient Active Problem List   Diagnosis Date Noted   Nontraumatic complete tear of left rotator cuff 07/23/2022   Biceps tendinitis of left upper extremity 07/23/2022   Visit for preventive health examination 10/31/2015   Acute drug-induced gout of right foot 08/15/2015   Hyperlipidemia 05/10/2013    Routine general medical examination at a health care facility 05/07/2013   Carpal tunnel syndrome 01/12/2013   HTN (hypertension) 01/26/2012   OSA (obstructive sleep apnea) 01/26/2012   Obesity 01/26/2012    PCP: Meredith Staggers MD   REFERRING PROVIDER: Huel Cote, MD  REFERRING DIAG:  [phone] (ICD-10-CM) - Nontraumatic complete tear of left rotator cuff    THERAPY DIAG:  Chronic left shoulder pain  Muscle weakness (generalized)  Stiffness of left shoulder, not elsewhere classified  Rationale for Evaluation and Treatment: Rehabilitation  ONSET DATE:  07/23/22 DOS   Days since surgery: 220  SUBJECTIVE:  SUBJECTIVE STATEMENT:  Pt states the shoulder is still not 100% yet. The shoulder will continue to hurt  with reaching BHB. Putting on a belt and reaching behind him in a car is painful. Pt states the strength is nowhere near where it was before. Feels strength on L side is about 65% of previous. Feels a pinching sharp pain at the top of the L shoulder.   Eval: Had surgery 07/23/22 for shoulder scope, RCR, biceps work too. Haven't moved shoulder much I don't know what to do with it. BP was getting a bit high yesterday,  I stopped taking pain meds bc of this yesterday. I'm not feeling well today honestly.   PERTINENT HISTORY: FROM MD NOTE 52 year old male with left shoulder subscapularis tear as well as biceps tendinitis.  At this time he is trialed multiple months of the band strengthening program as well as multiple injections into both shoulders.  He is not getting any persistent relief from his last injection in the left shoulder.  That effect I do believe that he would potentially be a candidate for arthroscopy with subscapularis repair and biceps tenodesis.  We discussed the limitations and  restrictions associated with this.  We discussed the complications and possible outcomes associated with this.  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  He understands all of this.  He would like to proceed with left shoulder arthroscopy. Plan :     -Plan for left shoulder arthroscopy with subscapularis repair and biceps tenodesis  POSTOPERATIVE PLAN: He will follow the subscapularis and biceps tendon rehab protocol.  He may begin early active flexion in the scapular plane.  He will avoid active internal rotation or resisted external rotation.  He will follow-up in 2 weeks for suture removal.  He will be placed on aspirin for blood clot prevention.  PAIN:  Are you having pain? Yes: NPRS scale: "0.5/10; 7/10 at worst Pain location: L shoulder in general  Pain description: dull pain  Aggravating factors: reaching BHB  Relieving factors: unsure   PRECAUTIONS: Other: post op protocol   WEIGHT BEARING RESTRICTIONS: Yes NWB surgical UE   FALLS:  Has patient fallen in last 6 months? 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 Steward Drone in about 2 weeks   OBJECTIVE:   UPPER EXTREMITY ROM:   AROM scaption to 75 deg  Passive ROM Right eval Left eval L 1/18 L  8/16  AROM  Shoulder flexion  95* 95 155  Shoulder extension    50  Shoulder abduction  Scaption PROM 100* 80 140  Shoulder adduction      Shoulder internal rotation  Full IR PROM  with elbow at 0* ABD   Full L2 p!  Shoulder external rotation  Approximately  30* at 0* ABD  neutral 45  Elbow flexion      Elbow extension      Wrist flexion      Wrist extension      Wrist ulnar deviation      Wrist radial deviation  Wrist pronation      Wrist supination      (Blank rows = not tested)  MMT Right 8/16  Left 8/16  Shoulder flexion 54.0  29.7  Shoulder extension 45.8 31.7  Shoulder abduction 65.0 20.3  Shoulder adduction     Shoulder internal rotation 60.0 55.0  Shoulder external rotation 27.1 20.9  (Blank rows = not tested)    TODAY'S TREATMENT:                                                                                                                                         DATE:  8/16  L GHJ inf glide in ABD and flexion grade III-IV  Exercises - Standing Shoulder Internal Rotation AAROM Behind Back with Towel   2-3 sets - 5 reps - 5-10seconds hold - Single Arm Doorway Pec Stretch at 90 Degrees Abduction  1 sets - 3 reps - 30 hold - Standing Shoulder Extension with Dowel  -2 sets - 10 reps - 5 hold - Standing Shoulder Row with Blue TB Anchored Resistance  -3 sets - 10 reps - Shoulder Scaption AAROM with Dowel  -- 2 sets - 10 reps - 5 hold - Standing Shoulder Blue TB Single Arm PNF D2 Flexion with Resistance  -- 2 sets - 10 reps    PATIENT EDUCATION: Education details: anatomy, exercise progression, DOMS expectations, envelope of function, HEP, POC  Person educated: Patient Education method: Explanation, Demonstration, and Handouts Education comprehension: verbalized understanding, returned demonstration, and needs further education  HOME EXERCISE PROGRAM:  Access Code: [email or web address] URL: [email or web address] Date: 08/01/2022 Prepared by: Zebedee Iba  ASSESSMENT:  CLINICAL IMPRESSION: Patient returns to therapy today with a chief complaint of reaching overhead and especially behind back.  Patient's left glenohumeral joint extremely stiff into inferior and posterior glide which is likely cause of pain.   After generalization patient did improve with range of motion and report of pain.  Exercise program prescribed which also help to further reduce patient's pain with reaching motions.  Of note patient's strength testing at this time demonstrates significant strength deficit from left to right ranging from 60% deficit to 20% deficit.  Plan to continue with joint mobilizations, shoulder stretching in multiple planes, and progressive strengthening for return to prior level function.  Patient would benefit from continued skilled therapy in order to address current functional deficits and improve left shoulder range of motion and strength for return to prior level function and occupation.  OBJECTIVE IMPAIRMENTS: decreased ROM, decreased strength, hypomobility, increased edema, increased fascial restrictions, increased muscle spasms, impaired flexibility, postural dysfunction, obesity, and pain.   ACTIVITY LIMITATIONS: carrying, lifting, bathing, toileting, dressing, reach over head, hygiene/grooming, and caring for others  PARTICIPATION LIMITATIONS: driving, shopping, community activity, and occupation  PERSONAL FACTORS: Behavior pattern, Education, Fitness, and Past/current experiences are also affecting patient's functional outcome.   REHAB POTENTIAL: Good  CLINICAL DECISION MAKING:  Stable/uncomplicated  EVALUATION COMPLEXITY: Low   GOALS: Goals reviewed with patient? Yes  SHORT TERM GOALS: Target date: 04/11/2023     Will be compliant with appropriate progressive HEP as appropriate post-op Baseline: Goal status: MET 08/19/22  2.  Surgical shoulder AROM and PROM to be full without increased pain  Baseline:  Goal status: INIITIAL  3.  Pain in surgical shoulder to be no more than 2/10 with functional activities in PT and HEP  Baseline:  Goal status: met  4.  Will have better understanding of postural liimitations and general ergonomics  Baseline:  Goal status: met    LONG TERM GOALS:  Target date: 05/23/2023     MMT in surgical shoulder to be at least 4/5 in all tested groups  Baseline:  Goal status: ongoing  2.  Will have been able to return to work on limited duty basis as deemed appropriate by MD without increase in pain  Baseline:  Goal status: ongoing  3.  Will be able to reach overhead to pull a fan or light switch with surgical UE without increase in pain Baseline:  Goal status: ongoing  4.  Pain in surgical shoulder with functional activities at home and work to be no more than 2/10 Baseline:  Goal status: ongoing    PLAN:  PT FREQUENCY:  1x/ week or 1x/ every other week   PT DURATION: 12 weeks  PLANNED INTERVENTIONS: Therapeutic exercises, Therapeutic activity, Neuromuscular re-education, Balance training, Gait training, Patient/Family education, Self Care, Joint mobilization, Aquatic Therapy, Dry Needling, Electrical stimulation, Cryotherapy, Moist heat, Taping, Ultrasound, Ionotophoresis 4mg /ml Dexamethasone, Manual therapy, and Re-evaluation  PLAN FOR NEXT SESSION: L GHJ joint mobility, L shoulder strength in all planes, D1/D2 strength and stretching, combined motion stretching  Zebedee Iba PT, DPT 02/28/23 9:27 AM

## 2023-03-26 ENCOUNTER — Ambulatory Visit (HOSPITAL_BASED_OUTPATIENT_CLINIC_OR_DEPARTMENT_OTHER): Payer: 59 | Attending: Orthopaedic Surgery | Admitting: Physical Therapy

## 2023-03-26 ENCOUNTER — Encounter (HOSPITAL_BASED_OUTPATIENT_CLINIC_OR_DEPARTMENT_OTHER): Payer: Self-pay | Admitting: Physical Therapy

## 2023-03-26 DIAGNOSIS — M6281 Muscle weakness (generalized): Secondary | ICD-10-CM | POA: Diagnosis present

## 2023-03-26 DIAGNOSIS — G8929 Other chronic pain: Secondary | ICD-10-CM | POA: Diagnosis present

## 2023-03-26 DIAGNOSIS — M25512 Pain in left shoulder: Secondary | ICD-10-CM | POA: Insufficient documentation

## 2023-03-26 DIAGNOSIS — M25612 Stiffness of left shoulder, not elsewhere classified: Secondary | ICD-10-CM | POA: Diagnosis present

## 2023-03-26 NOTE — Therapy (Addendum)
OUTPATIENT PHYSICAL THERAPY SHOULDER TREATMENT   Patient Name: Micheal Mckinney MRN: [id number] DOB:Jan 27, 1971, 52 y.o., male Today's Date: 03/26/2023  END OF SESSION:  PT End of Session - 03/26/23 0839     Visit Number 12    Number of Visits 30    Date for PT Re-Evaluation 05/29/23    Authorization Type UHC    Authorization Time Period 07/25/22 to 10/04/22    PT Start Time 0845    PT Stop Time 0925    PT Time Calculation (min) 40 min    Activity Tolerance Patient tolerated treatment well    Behavior During Therapy [facility] for tasks assessed/performed                  Past Medical History:  Diagnosis Date   Allergy    hay fever   Foreign body (FB) in soft tissue    right palm   GERD (gastroesophageal reflux disease)    History of chicken pox    Hyperlipidemia    Hypertension    OSA (obstructive sleep apnea) 01/26/2012   does not use CPAP   Past Surgical History:  Procedure Laterality Date   CARPAL TUNNEL RELEASE Bilateral 05/04/2013   CERVICAL FUSION     Alternative to this done in 05/2017, disc replacement   FOREIGN BODY REMOVAL Right 05/18/2018   Procedure: REMOVAL FOREIGN BODY RIGHT PALM;  Surgeon: Betha Loa, MD;  Location: [facility];  Service: Orthopedics;  Laterality: Right;   IRRIGATION AND DEBRIDEMENT SEBACEOUS CYST  2009    on back and neck   SHOULDER ARTHROSCOPY WITH ROTATOR CUFF REPAIR AND OPEN BICEPS TENODESIS Left 07/23/2022   Procedure: LEFT SHOULDER ARTHROSCOPY WITH ROTATOR CUFF REPAIR AND BICEPS TENODESIS;  Surgeon: Huel Cote, MD;  Location: [facility];  Service: Orthopedics;  Laterality: Left;   Patient Active Problem List   Diagnosis Date Noted   Nontraumatic complete tear of left rotator cuff 07/23/2022   Biceps tendinitis of left upper extremity 07/23/2022   Visit for preventive health examination 10/31/2015   Acute drug-induced gout of right foot 08/15/2015   Hyperlipidemia 05/10/2013   Routine  general medical examination at a health care facility 05/07/2013   Carpal tunnel syndrome 01/12/2013   HTN (hypertension) 01/26/2012   OSA (obstructive sleep apnea) 01/26/2012   Obesity 01/26/2012    PCP: Meredith Staggers MD   REFERRING PROVIDER: Huel Cote, MD  REFERRING DIAG:  [phone] (ICD-10-CM) - Nontraumatic complete tear of left rotator cuff    THERAPY DIAG:  Chronic left shoulder pain  Muscle weakness (generalized)  Stiffness of left shoulder, not elsewhere classified  Rationale for Evaluation and Treatment: Rehabilitation  ONSET DATE:  07/23/22 DOS   Days since surgery: 246  SUBJECTIVE:  SUBJECTIVE STATEMENT:  Pt states the pinching is better. The shoulder was very sore after last session due to the strength test. No pain with HEP.   Re-eval: Pt states the shoulder is still not 100% yet. The shoulder will continue to hurt  with reaching BHB. Putting on a belt and reaching behind him in a car is painful. Pt states the strength is nowhere near where it was before. Feels strength on L side is about 65% of previous. Feels a pinching sharp pain at the top of the L shoulder.   Eval: Had surgery 07/23/22 for shoulder scope, RCR, biceps work too. Haven't moved shoulder much I don't know what to do with it. BP was getting a bit high yesterday,  I stopped taking pain meds bc of this yesterday. I'm not feeling well today honestly.   PERTINENT HISTORY: FROM MD NOTE 52 year old male with left shoulder subscapularis tear as well as biceps tendinitis.  At this time he is trialed multiple months of the band strengthening program as well as multiple injections into both shoulders.  He is not getting any persistent relief from his last injection in the left shoulder.  That effect I do believe that he would  potentially be a candidate for arthroscopy with subscapularis repair and biceps tenodesis.  We discussed the limitations and restrictions associated with this.  We discussed the complications and possible outcomes associated with this.  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  He understands all of this.  He would like to proceed with left shoulder arthroscopy. Plan :     -Plan for left shoulder arthroscopy with subscapularis repair and biceps tenodesis  POSTOPERATIVE PLAN: He will follow the subscapularis and biceps tendon rehab protocol.  He may begin early active flexion in the scapular plane.  He will avoid active internal rotation or resisted external rotation.  He will follow-up in 2 weeks for suture removal.  He will be placed on aspirin for blood clot prevention.  PAIN:  Are you having pain? Yes: NPRS scale: "0.5/10; 7/10 at worst Pain location: L shoulder in general  Pain description: dull pain  Aggravating factors: reaching BHB  Relieving factors: unsure   PRECAUTIONS: Other: post op protocol   WEIGHT BEARING RESTRICTIONS: Yes NWB surgical UE   FALLS:  Has patient fallen in last 6 months? 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 Steward Drone in about 2 weeks   OBJECTIVE:   UPPER EXTREMITY ROM:   AROM scaption to 75 deg  Passive ROM Right eval Left eval L 1/18 L  8/16  AROM  Shoulder flexion  95* 95 155  Shoulder extension    50  Shoulder abduction  Scaption PROM 100* 80 140  Shoulder adduction       Shoulder internal rotation  Full IR PROM  with elbow at 0* ABD   Full L2 p!  Shoulder external rotation  Approximately 30* at 0* ABD  neutral 45  Elbow flexion      Elbow extension  Wrist flexion      Wrist extension      Wrist ulnar deviation      Wrist radial deviation      Wrist pronation      Wrist supination      (Blank rows = not tested)  MMT Right 8/16  Left 8/16  Shoulder flexion 54.0  29.7  Shoulder extension 45.8 31.7  Shoulder abduction 65.0 20.3  Shoulder adduction     Shoulder internal rotation 60.0 55.0  Shoulder external rotation 27.1 20.9  (Blank rows = not tested)    TODAY'S TREATMENT:                                                                                                                                         DATE:  9/11  L GHJ inf glide in ABD and flexion grade III-IV Prone PA L GHJ glide grade IV  Exercises - Standing Shoulder Internal Rotation AAROM Behind Back with Towel   - 1 x daily - 7 x weekly - 2-3 sets - 5 reps - 5-10seconds hold - Standing Bicep Stretch at Wall  - 2 x daily - 7 x weekly - 1 sets - 3 reps - 30 hold - Single Arm Doorway Pec Stretch at 90 Degrees Abduction  - 2 x daily - 7 x weekly - 1 sets - 3 reps - 30 hold - Shoulder Scaption AAROM with Dowel  - 1 x daily - 7 x weekly - 2 sets - 10 reps - 5 hold - Standing Shoulder Single Arm PNF D2 Flexion with Resistance  - 1 x daily - 4-5 x weekly - 2 sets - 10 reps - Shoulder Abduction with Dumbbells - Thumbs Up  - 1 x daily - 4-5 x weekly - 3 sets - 8-10 reps 2lb DB - Staggered Stance Single Arm Row with Anchored Resistance  - 1 x daily - 4-5 x weekly - 3 sets - 10 reps black TB - Shoulder Overhead Press in Flexion with Dumbbells  - 1 x daily - 4-5 x weekly - 3 sets - 8-10 reps 2lb DB  8/16  L GHJ inf glide in ABD and flexion grade III-IV  Exercises - Standing Shoulder Internal Rotation AAROM Behind Back with Towel   2-3 sets - 5 reps - 5-10seconds hold - Single Arm  Doorway Pec Stretch at 90 Degrees Abduction  1 sets - 3 reps - 30 hold - Standing Shoulder Extension with Dowel  -2 sets - 10 reps - 5 hold - Standing Shoulder Row with Blue TB Anchored Resistance  -3 sets - 10 reps - Shoulder Scaption AAROM with Dowel  -- 2 sets - 10 reps - 5 hold - Standing Shoulder Blue TB Single Arm PNF D2 Flexion with Resistance  -- 2 sets - 10 reps    PATIENT EDUCATION: Education details: anatomy, exercise progression, DOMS expectations, envelope of  function, HEP, POC  Person educated: Patient Education method: Explanation, Demonstration, and Handouts Education comprehension: verbalized understanding, returned demonstration, and needs further education  HOME EXERCISE PROGRAM:  Access Code: [email or web address] URL: [email or web address] Date: 08/01/2022 Prepared by: Zebedee Iba  ASSESSMENT:  CLINICAL IMPRESSION: Pt able to start [facility] strengthening today without pain. Pt responded very well to joint mobilizations and BHB and Abd motions improved to no pain. Pt given updated OH strengthening HEP and advised on signs of tendinitis and to prioritize recovery given his OH work demands. Plan to continue with joint mobs PRN and progression of OH strength and stability. Consider OH endurance holds at next and peri scapular strength. Patient would benefit from continued skilled therapy in order to address current functional deficits and improve left shoulder range of motion and strength for return to prior level function and occupation.  OBJECTIVE IMPAIRMENTS: decreased ROM, decreased strength, hypomobility, increased edema, increased fascial restrictions, increased muscle spasms, impaired flexibility, postural dysfunction, obesity, and pain.   ACTIVITY LIMITATIONS: carrying, lifting, bathing, toileting, dressing, reach over head, hygiene/grooming, and caring for others  PARTICIPATION LIMITATIONS: driving, shopping, community activity, and occupation  PERSONAL FACTORS:  Behavior pattern, Education, Fitness, and Past/current experiences are also affecting patient's functional outcome.   REHAB POTENTIAL: Good  CLINICAL DECISION MAKING: Stable/uncomplicated  EVALUATION COMPLEXITY: Low   GOALS: Goals reviewed with patient? Yes  SHORT TERM GOALS: Target date: 04/11/2023     Will be compliant with appropriate progressive HEP as appropriate post-op Baseline: Goal status: MET 08/19/22  2.  Surgical shoulder AROM and PROM to be full without increased pain  Baseline:  Goal status: INIITIAL  3.  Pain in surgical shoulder to be no more than 2/10 with functional activities in PT and HEP  Baseline:  Goal status: met  4.  Will have better understanding of postural liimitations and general ergonomics  Baseline:  Goal status: met    LONG TERM GOALS: Target date: 05/23/2023     MMT in surgical shoulder to be at least 4/5 in all tested groups  Baseline:  Goal status: ongoing  2.  Will have been able to return to work on limited duty basis as deemed appropriate by MD without increase in pain  Baseline:  Goal status: ongoing  3.  Will be able to reach overhead to pull a fan or light switch with surgical UE without increase in pain Baseline:  Goal status: ongoing  4.  Pain in surgical shoulder with functional activities at home and work to be no more than 2/10 Baseline:  Goal status: ongoing    PLAN:  PT FREQUENCY:  1x/ week or 1x/ every other week   PT DURATION: 12 weeks  PLANNED INTERVENTIONS: Therapeutic exercises, Therapeutic activity, Neuromuscular re-education, Balance training, Gait training, Patient/Family education, Self Care, Joint mobilization, Aquatic Therapy, Dry Needling, Electrical stimulation, Cryotherapy, Moist heat, Taping, Ultrasound, Ionotophoresis 4mg /ml Dexamethasone, Manual therapy, and Re-evaluation  PLAN FOR NEXT SESSION: L GHJ joint mobility, L shoulder strength in all planes, D1/D2 strength and stretching, combined  motion stretching  Zebedee Iba PT, DPT 03/26/23 9:29 AM

## 2023-04-09 ENCOUNTER — Encounter (HOSPITAL_BASED_OUTPATIENT_CLINIC_OR_DEPARTMENT_OTHER): Payer: Self-pay

## 2023-04-09 ENCOUNTER — Ambulatory Visit (HOSPITAL_BASED_OUTPATIENT_CLINIC_OR_DEPARTMENT_OTHER): Payer: 59

## 2023-04-09 DIAGNOSIS — G8929 Other chronic pain: Secondary | ICD-10-CM

## 2023-04-09 DIAGNOSIS — M6281 Muscle weakness (generalized): Secondary | ICD-10-CM

## 2023-04-09 DIAGNOSIS — M25612 Stiffness of left shoulder, not elsewhere classified: Secondary | ICD-10-CM

## 2023-04-09 DIAGNOSIS — M25512 Pain in left shoulder: Secondary | ICD-10-CM | POA: Diagnosis not present

## 2023-04-09 NOTE — Therapy (Signed)
OUTPATIENT PHYSICAL THERAPY SHOULDER TREATMENT   Patient Name: Micheal Mckinney MRN: [id number] DOB:03-03-1971, 52 y.o., male Today's Date: 04/09/2023  END OF SESSION:  PT End of Session - 04/09/23 0754     Visit Number 13    Number of Visits 30    Date for PT Re-Evaluation 05/29/23    Authorization Type UHC    Authorization Time Period 07/25/22 to 10/04/22    PT Start Time 0800    PT Stop Time 0844    PT Time Calculation (min) 44 min    Activity Tolerance Patient tolerated treatment well    Behavior During Therapy [facility] for tasks assessed/performed                   Past Medical History:  Diagnosis Date   Allergy    hay fever   Foreign body (FB) in soft tissue    right palm   GERD (gastroesophageal reflux disease)    History of chicken pox    Hyperlipidemia    Hypertension    OSA (obstructive sleep apnea) 01/26/2012   does not use CPAP   Past Surgical History:  Procedure Laterality Date   CARPAL TUNNEL RELEASE Bilateral 05/04/2013   CERVICAL FUSION     Alternative to this done in 05/2017, disc replacement   FOREIGN BODY REMOVAL Right 05/18/2018   Procedure: REMOVAL FOREIGN BODY RIGHT PALM;  Surgeon: Betha Loa, MD;  Location: [facility];  Service: Orthopedics;  Laterality: Right;   IRRIGATION AND DEBRIDEMENT SEBACEOUS CYST  2009    on back and neck   SHOULDER ARTHROSCOPY WITH ROTATOR CUFF REPAIR AND OPEN BICEPS TENODESIS Left 07/23/2022   Procedure: LEFT SHOULDER ARTHROSCOPY WITH ROTATOR CUFF REPAIR AND BICEPS TENODESIS;  Surgeon: Huel Cote, MD;  Location: [facility];  Service: Orthopedics;  Laterality: Left;   Patient Active Problem List   Diagnosis Date Noted   Nontraumatic complete tear of left rotator cuff 07/23/2022   Biceps tendinitis of left upper extremity 07/23/2022   Visit for preventive health examination 10/31/2015   Acute drug-induced gout of right foot 08/15/2015   Hyperlipidemia 05/10/2013   Routine  general medical examination at a health care facility 05/07/2013   Carpal tunnel syndrome 01/12/2013   HTN (hypertension) 01/26/2012   OSA (obstructive sleep apnea) 01/26/2012   Obesity 01/26/2012    PCP: Meredith Staggers MD   REFERRING PROVIDER: Huel Cote, MD  REFERRING DIAG:  [phone] (ICD-10-CM) - Nontraumatic complete tear of left rotator cuff    THERAPY DIAG:  Chronic left shoulder pain  Muscle weakness (generalized)  Stiffness of left shoulder, not elsewhere classified  Rationale for Evaluation and Treatment: Rehabilitation  ONSET DATE:  07/23/22 DOS   Days since surgery: 260  SUBJECTIVE:  SUBJECTIVE STATEMENT:  Pt reports improved pain from last visit, though some is still present.   Re-eval: Pt states the shoulder is still not 100% yet. The shoulder will continue to hurt  with reaching BHB. Putting on a belt and reaching behind him in a car is painful. Pt states the strength is nowhere near where it was before. Feels strength on L side is about 65% of previous. Feels a pinching sharp pain at the top of the L shoulder.   Eval: Had surgery 07/23/22 for shoulder scope, RCR, biceps work too. Haven't moved shoulder much I don't know what to do with it. BP was getting a bit high yesterday,  I stopped taking pain meds bc of this yesterday. I'm not feeling well today honestly.   PERTINENT HISTORY: FROM MD NOTE 52 year old male with left shoulder subscapularis tear as well as biceps tendinitis.  At this time he is trialed multiple months of the band strengthening program as well as multiple injections into both shoulders.  He is not getting any persistent relief from his last injection in the left shoulder.  That effect I do believe that he would potentially be a candidate for arthroscopy with  subscapularis repair and biceps tenodesis.  We discussed the limitations and restrictions associated with this.  We discussed the complications and possible outcomes associated with this.  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  He understands all of this.  He would like to proceed with left shoulder arthroscopy. Plan :     -Plan for left shoulder arthroscopy with subscapularis repair and biceps tenodesis  POSTOPERATIVE PLAN: He will follow the subscapularis and biceps tendon rehab protocol.  He may begin early active flexion in the scapular plane.  He will avoid active internal rotation or resisted external rotation.  He will follow-up in 2 weeks for suture removal.  He will be placed on aspirin for blood clot prevention.  PAIN:  Are you having pain? Yes: NPRS scale: "0.5/10; 7/10 at worst Pain location: L shoulder in general  Pain description: dull pain  Aggravating factors: reaching BHB  Relieving factors: unsure   PRECAUTIONS: Other: post op protocol   WEIGHT BEARING RESTRICTIONS: Yes NWB surgical UE   FALLS:  Has patient fallen in last 6 months? 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 Steward Drone in about 2 weeks   OBJECTIVE:   UPPER EXTREMITY ROM:   AROM scaption to 75 deg  Passive ROM Right eval Left eval L 1/18 L  8/16  AROM  Shoulder flexion  95* 95 155  Shoulder extension    50  Shoulder abduction  Scaption PROM 100* 80 140  Shoulder adduction      Shoulder internal rotation  Full IR PROM   with elbow at 0* ABD   Full L2 p!  Shoulder external rotation  Approximately 30* at 0* ABD  neutral 45  Elbow flexion      Elbow extension      Wrist flexion      Wrist extension  Wrist ulnar deviation      Wrist radial deviation      Wrist pronation      Wrist supination      (Blank rows = not tested)  MMT Right 8/16  Left 8/16  Shoulder flexion 54.0  29.7  Shoulder extension 45.8 31.7  Shoulder abduction 65.0 20.3  Shoulder adduction     Shoulder internal rotation 60.0 55.0  Shoulder external rotation 27.1 20.9  (Blank rows = not tested)    TODAY'S TREATMENT:                                                                                                                                         DATE:   9/25 PROM L shoulder  GHJ mobilizations STM to proximal biceps  Rhythmic stabilization supine at 90deg 30sec x2 Supine ABC 2# x2 Supine chest press L only 4# 2x10 Prone flexion 2# 3x10 Prone H abduction 2# 3x10 Prone extension 4# 2x10 Wall clocks RTB 2x10L Standing active flexion 2# x10 Standing active scaption 2# x10 OH ball rolls at wall 4 way x20ea 3.3#   9/11  L GHJ inf glide in ABD and flexion grade III-IV Prone PA L GHJ glide grade IV  Exercises - Standing Shoulder Internal Rotation AAROM Behind Back with Towel   - 1 x daily - 7 x weekly - 2-3 sets - 5 reps - 5-10seconds hold - Standing Bicep Stretch at Wall  - 2 x daily - 7 x weekly - 1 sets - 3 reps - 30 hold - Single Arm Doorway Pec Stretch at 90 Degrees Abduction  - 2 x daily - 7 x weekly - 1 sets - 3 reps - 30 hold - Shoulder Scaption AAROM with Dowel  - 1 x daily - 7 x weekly - 2 sets - 10 reps - 5 hold - Standing Shoulder Single Arm PNF D2 Flexion with Resistance  - 1 x daily - 4-5 x weekly - 2 sets - 10 reps - Shoulder Abduction with Dumbbells - Thumbs Up  - 1 x daily - 4-5 x weekly - 3 sets - 8-10 reps 2lb DB - Staggered Stance Single Arm Row with Anchored Resistance  - 1 x daily - 4-5 x weekly  - 3 sets - 10 reps black TB - Shoulder Overhead Press in Flexion with Dumbbells  - 1 x daily - 4-5 x weekly - 3 sets - 8-10 reps 2lb DB  8/16  L GHJ inf glide in ABD and flexion grade III-IV  Exercises - Standing Shoulder Internal Rotation AAROM Behind Back with Towel   2-3 sets - 5 reps - 5-10seconds hold - Single Arm Doorway Pec Stretch at 90 Degrees Abduction  1 sets - 3 reps - 30 hold - Standing Shoulder Extension with Dowel  -2 sets - 10 reps - 5 hold - Standing Shoulder Row with Blue TB Anchored Resistance  -  3 sets - 10 reps - Shoulder Scaption AAROM with Dowel  -- 2 sets - 10 reps - 5 hold - Standing Shoulder Blue TB Single Arm PNF D2 Flexion with Resistance  -- 2 sets - 10 reps    PATIENT EDUCATION: Education details: anatomy, exercise progression, DOMS expectations, envelope of function, HEP, POC  Person educated: Patient Education method: Explanation, Demonstration, and Handouts Education comprehension: verbalized understanding, returned demonstration, and needs further education  HOME EXERCISE PROGRAM:  Access Code: [email or web address] URL: [email or web address] Date: 08/01/2022 Prepared by: Zebedee Iba  ASSESSMENT:  CLINICAL IMPRESSION: Overall good tolerance for PROM, though he is tight into end range flexion/abd and IR with mild discomfort. Will continue to progress gentle strengthening as tolerated.   OBJECTIVE IMPAIRMENTS: decreased ROM, decreased strength, hypomobility, increased edema, increased fascial restrictions, increased muscle spasms, impaired flexibility, postural dysfunction, obesity, and pain.   ACTIVITY LIMITATIONS: carrying, lifting, bathing, toileting, dressing, reach over head, hygiene/grooming, and caring for others  PARTICIPATION LIMITATIONS: driving, shopping, community activity, and occupation  PERSONAL FACTORS: Behavior pattern, Education, Fitness, and Past/current experiences are also affecting patient's functional outcome.   REHAB  POTENTIAL: Good  CLINICAL DECISION MAKING: Stable/uncomplicated  EVALUATION COMPLEXITY: Low   GOALS: Goals reviewed with patient? Yes  SHORT TERM GOALS: Target date: 04/11/2023     Will be compliant with appropriate progressive HEP as appropriate post-op Baseline: Goal status: MET 08/19/22  2.  Surgical shoulder AROM and PROM to be full without increased pain  Baseline:  Goal status: INIITIAL  3.  Pain in surgical shoulder to be no more than 2/10 with functional activities in PT and HEP  Baseline:  Goal status: met  4.  Will have better understanding of postural liimitations and general ergonomics  Baseline:  Goal status: met    LONG TERM GOALS: Target date: 05/23/2023     MMT in surgical shoulder to be at least 4/5 in all tested groups  Baseline:  Goal status: ongoing  2.  Will have been able to return to work on limited duty basis as deemed appropriate by MD without increase in pain  Baseline:  Goal status: ongoing  3.  Will be able to reach overhead to pull a fan or light switch with surgical UE without increase in pain Baseline:  Goal status: ongoing  4.  Pain in surgical shoulder with functional activities at home and work to be no more than 2/10 Baseline:  Goal status: ongoing    PLAN:  PT FREQUENCY:  1x/ week or 1x/ every other week   PT DURATION: 12 weeks  PLANNED INTERVENTIONS: Therapeutic exercises, Therapeutic activity, Neuromuscular re-education, Balance training, Gait training, Patient/Family education, Self Care, Joint mobilization, Aquatic Therapy, Dry Needling, Electrical stimulation, Cryotherapy, Moist heat, Taping, Ultrasound, Ionotophoresis 4mg /ml Dexamethasone, Manual therapy, and Re-evaluation  PLAN FOR NEXT SESSION: L GHJ joint mobility, L shoulder strength in all planes, D1/D2 strength and stretching, combined motion stretching Riki Altes, PTA  04/09/23 10:21 AM

## 2023-04-23 ENCOUNTER — Encounter (HOSPITAL_BASED_OUTPATIENT_CLINIC_OR_DEPARTMENT_OTHER): Payer: Self-pay | Admitting: Physical Therapy

## 2023-04-23 ENCOUNTER — Ambulatory Visit (HOSPITAL_BASED_OUTPATIENT_CLINIC_OR_DEPARTMENT_OTHER): Payer: 59 | Attending: Orthopaedic Surgery | Admitting: Physical Therapy

## 2023-04-23 DIAGNOSIS — M25612 Stiffness of left shoulder, not elsewhere classified: Secondary | ICD-10-CM | POA: Insufficient documentation

## 2023-04-23 DIAGNOSIS — M25512 Pain in left shoulder: Secondary | ICD-10-CM | POA: Diagnosis present

## 2023-04-23 DIAGNOSIS — G8929 Other chronic pain: Secondary | ICD-10-CM | POA: Diagnosis present

## 2023-04-23 DIAGNOSIS — M6281 Muscle weakness (generalized): Secondary | ICD-10-CM | POA: Insufficient documentation

## 2023-04-23 NOTE — Therapy (Signed)
OUTPATIENT PHYSICAL THERAPY SHOULDER TREATMENT   Patient Name: Micheal Mckinney MRN: [id number] DOB:[birth year], 52 y.o., male Today's Date: 04/23/2023  END OF SESSION:  PT End of Session - 04/23/23 0839     Visit Number 14    Number of Visits 30    Date for PT Re-Evaluation 05/29/23    Authorization Type UHC    Authorization Time Period 07/25/22 to 10/04/22    PT Start Time 0804    PT Stop Time 0834    PT Time Calculation (min) 30 min    Activity Tolerance Patient tolerated treatment well    Behavior During Therapy [facility] for tasks assessed/performed                    Past Medical History:  Diagnosis Date   Allergy    hay fever   Foreign body (FB) in soft tissue    right palm   GERD (gastroesophageal reflux disease)    History of chicken pox    Hyperlipidemia    Hypertension    OSA (obstructive sleep apnea) 01/26/2012   does not use CPAP   Past Surgical History:  Procedure Laterality Date   CARPAL TUNNEL RELEASE Bilateral 05/04/2013   CERVICAL FUSION     Alternative to this done in 05/2017, disc replacement   FOREIGN BODY REMOVAL Right 05/18/2018   Procedure: REMOVAL FOREIGN BODY RIGHT PALM;  Surgeon: Betha Loa, MD;  Location: [facility];  Service: Orthopedics;  Laterality: Right;   IRRIGATION AND DEBRIDEMENT SEBACEOUS CYST  2009    on back and neck   SHOULDER ARTHROSCOPY WITH ROTATOR CUFF REPAIR AND OPEN BICEPS TENODESIS Left 07/23/2022   Procedure: LEFT SHOULDER ARTHROSCOPY WITH ROTATOR CUFF REPAIR AND BICEPS TENODESIS;  Surgeon: Huel Cote, MD;  Location: [facility];  Service: Orthopedics;  Laterality: Left;   Patient Active Problem List   Diagnosis Date Noted   Nontraumatic complete tear of left rotator cuff 07/23/2022   Biceps tendinitis of left upper extremity 07/23/2022   Visit for preventive health examination 10/31/2015   Acute drug-induced gout of right foot 08/15/2015   Hyperlipidemia 05/10/2013   Routine  general medical examination at a health care facility 05/07/2013   Carpal tunnel syndrome 01/12/2013   HTN (hypertension) 01/26/2012   OSA (obstructive sleep apnea) 01/26/2012   Obesity 01/26/2012    PCP: Meredith Staggers MD   REFERRING PROVIDER: Huel Cote, MD  REFERRING DIAG:  [phone] (ICD-10-CM) - Nontraumatic complete tear of left rotator cuff    THERAPY DIAG:  Chronic left shoulder pain  Muscle weakness (generalized)  Stiffness of left shoulder, not elsewhere classified  Rationale for Evaluation and Treatment: Rehabilitation  ONSET DATE:  07/23/22 DOS   Days since surgery: 274  SUBJECTIVE:  SUBJECTIVE STATEMENT:  Pt states eveything is improving but still wake up with pain. Reaching BHB will still be painful. The shoulder will hurt waking up in the middle of the night. He is a Chiropodist.   Re-eval: Pt states the shoulder is still not 100% yet. The shoulder will continue to hurt  with reaching BHB. Putting on a belt and reaching behind him in a car is painful. Pt states the strength is nowhere near where it was before. Feels strength on L side is about 65% of previous. Feels a pinching sharp pain at the top of the L shoulder.   Eval: Had surgery 07/23/22 for shoulder scope, RCR, biceps work too. Haven't moved shoulder much I don't know what to do with it. BP was getting a bit high yesterday,  I stopped taking pain meds bc of this yesterday. I'm not feeling well today honestly.   PERTINENT HISTORY: FROM MD NOTE 52 year old male with left shoulder subscapularis tear as well as biceps tendinitis.  At this time he is trialed multiple months of the band strengthening program as well as multiple injections into both shoulders.  He is not getting any persistent relief from his last injection in the  left shoulder.  That effect I do believe that he would potentially be a candidate for arthroscopy with subscapularis repair and biceps tenodesis.  We discussed the limitations and restrictions associated with this.  We discussed the complications and possible outcomes associated with this.  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  He understands all of this.  He would like to proceed with left shoulder arthroscopy. Plan :     -Plan for left shoulder arthroscopy with subscapularis repair and biceps tenodesis  POSTOPERATIVE PLAN: He will follow the subscapularis and biceps tendon rehab protocol.  He may begin early active flexion in the scapular plane.  He will avoid active internal rotation or resisted external rotation.  He will follow-up in 2 weeks for suture removal.  He will be placed on aspirin for blood clot prevention.  PAIN:  Are you having pain? Yes: NPRS scale: "0.5/10; 7/10 at worst Pain location: L shoulder in general  Pain description: dull pain  Aggravating factors: reaching BHB  Relieving factors: unsure   PRECAUTIONS: Other: post op protocol   WEIGHT BEARING RESTRICTIONS: Yes NWB surgical UE   FALLS:  Has patient fallen in last 6 months? 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 Steward Drone in about 2 weeks   OBJECTIVE:   UPPER EXTREMITY ROM:   AROM scaption to 75 deg  Passive ROM Right eval Left eval L 1/18 L  8/16  AROM  Shoulder flexion  95* 95 155  Shoulder extension    50  Shoulder  abduction  Scaption PROM 100* 80 140  Shoulder adduction      Shoulder internal rotation  Full IR PROM  with elbow at 0* ABD   Full L2 p!  Shoulder external rotation  Approximately 30* at 0* ABD  neutral 45  Elbow flexion  Elbow extension      Wrist flexion      Wrist extension      Wrist ulnar deviation      Wrist radial deviation      Wrist pronation      Wrist supination      (Blank rows = not tested)  MMT Right 8/16  Left 8/16  Shoulder flexion 54.0  29.7  Shoulder extension 45.8 31.7  Shoulder abduction 65.0 20.3  Shoulder adduction     Shoulder internal rotation 60.0 55.0  Shoulder external rotation 27.1 20.9  (Blank rows = not tested)    TODAY'S TREATMENT:                                                                                                                                         DATE:   10/9  L GHJ inf glide in ABD and flexion grade III-IV Prone PA L GHJ glide grade IV  5lb supine pec fly 3x8  Med height table push up 4x6 Bent over row 35lbs 3x8 OH carry and walk 5lbs 68ft 3x laps     9/25 PROM L shoulder  GHJ mobilizations STM to proximal biceps  Rhythmic stabilization supine at 90deg 30sec x2 Supine ABC 2# x2 Supine chest press L only 4# 2x10 Prone flexion 2# 3x10 Prone H abduction 2# 3x10 Prone extension 4# 2x10 Wall clocks RTB 2x10L Standing active flexion 2# x10 Standing active scaption 2# x10 OH ball rolls at wall 4 way x20ea 3.3#   9/11  L GHJ inf glide in ABD and flexion grade III-IV Prone PA L GHJ glide grade IV  Exercises - Standing Shoulder Internal Rotation AAROM Behind Back with Towel   - 1 x daily - 7 x weekly - 2-3 sets - 5 reps - 5-10seconds hold - Standing Bicep Stretch at Wall  - 2 x daily - 7 x weekly - 1 sets - 3 reps - 30 hold - Single Arm Doorway Pec Stretch at 90 Degrees Abduction  - 2 x daily - 7 x weekly - 1 sets - 3 reps - 30 hold - Shoulder Scaption AAROM with Dowel  - 1 x daily - 7 x weekly - 2 sets -  10 reps - 5 hold - Standing Shoulder Single Arm PNF D2 Flexion with Resistance  - 1 x daily - 4-5 x weekly - 2 sets - 10 reps - Shoulder Abduction with Dumbbells - Thumbs Up  - 1 x daily - 4-5 x weekly - 3 sets - 8-10 reps 2lb DB - Staggered Stance Single Arm Row with Anchored Resistance  - 1 x daily - 4-5 x weekly - 3 sets - 10 reps black TB - Shoulder Overhead Press in Flexion with Dumbbells  - 1 x daily - 4-5 x weekly - 3 sets - 8-10 reps 2lb DB  8/16  L GHJ inf glide in  ABD and flexion grade III-IV  Exercises - Standing Shoulder Internal Rotation AAROM Behind Back with Towel   2-3 sets - 5 reps - 5-10seconds hold - Single Arm Doorway Pec Stretch at 90 Degrees Abduction  1 sets - 3 reps - 30 hold - Standing Shoulder Extension with Dowel  -2 sets - 10 reps - 5 hold - Standing Shoulder Row with Blue TB Anchored Resistance  -3 sets - 10 reps - Shoulder Scaption AAROM with Dowel  -- 2 sets - 10 reps - 5 hold - Standing Shoulder Blue TB Single Arm PNF D2 Flexion with Resistance  -- 2 sets - 10 reps    PATIENT EDUCATION: Education details: anatomy, exercise progression, DOMS expectations, envelope of function, HEP, POC  Person educated: Patient Education method: Explanation, Demonstration, and Handouts Education comprehension: verbalized understanding, returned demonstration, and needs further education  HOME EXERCISE PROGRAM:  Access Code: [email or web address] URL: [email or web address] Date: 08/01/2022 Prepared by: Zebedee Iba  ASSESSMENT:  CLINICAL IMPRESSION: Motion improved with joint mobilizations with less ant and post tightness following manual therapy and loaded exercise through full ROM. HEP progressed with intensity and frequency decreased for recovery. Pt able to reach and hold OH positions without pain with increased weight. Plan to continue with OH strength and heavier intensities for return to work related duties. Pt would benefit from continued skilled therapy in  order to reach goals and maximize functional L UE strength and ROM for full return to PLOF and occupation.   OBJECTIVE IMPAIRMENTS: decreased ROM, decreased strength, hypomobility, increased edema, increased fascial restrictions, increased muscle spasms, impaired flexibility, postural dysfunction, obesity, and pain.   ACTIVITY LIMITATIONS: carrying, lifting, bathing, toileting, dressing, reach over head, hygiene/grooming, and caring for others  PARTICIPATION LIMITATIONS: driving, shopping, community activity, and occupation  PERSONAL FACTORS: Behavior pattern, Education, Fitness, and Past/current experiences are also affecting patient's functional outcome.   REHAB POTENTIAL: Good  CLINICAL DECISION MAKING: Stable/uncomplicated  EVALUATION COMPLEXITY: Low   GOALS: Goals reviewed with patient? Yes  SHORT TERM GOALS: Target date: 04/11/2023     Will be compliant with appropriate progressive HEP as appropriate post-op Baseline: Goal status: MET 08/19/22  2.  Surgical shoulder AROM and PROM to be full without increased pain  Baseline:  Goal status: INIITIAL  3.  Pain in surgical shoulder to be no more than 2/10 with functional activities in PT and HEP  Baseline:  Goal status: met  4.  Will have better understanding of postural liimitations and general ergonomics  Baseline:  Goal status: met    LONG TERM GOALS: Target date: 05/23/2023     MMT in surgical shoulder to be at least 4/5 in all tested groups  Baseline:  Goal status: ongoing  2.  Will have been able to return to work on limited duty basis as deemed appropriate by MD without increase in pain  Baseline:  Goal status: ongoing  3.  Will be able to reach overhead to pull a fan or light switch with surgical UE without increase in pain Baseline:  Goal status: ongoing  4.  Pain in surgical shoulder with functional activities at home and work to be no more than 2/10 Baseline:  Goal status:  ongoing    PLAN:  PT FREQUENCY:  1x/ week or 1x/ every other week   PT DURATION: 12 weeks  PLANNED INTERVENTIONS: Therapeutic exercises, Therapeutic activity, Neuromuscular re-education, Balance training, Gait training, Patient/Family education, Self Care, Joint mobilization, Aquatic Therapy, Dry Needling, Electrical stimulation, Cryotherapy, Moist heat, Taping, Ultrasound,  Ionotophoresis 4mg /ml Dexamethasone, Manual therapy, and Re-evaluation  PLAN FOR NEXT SESSION: L GHJ joint mobility, L shoulder strength in all planes, D1/D2 strength and stretching, combined motion stretching Riki Altes, PTA  04/23/23 8:41 AM

## 2023-05-07 ENCOUNTER — Ambulatory Visit (HOSPITAL_BASED_OUTPATIENT_CLINIC_OR_DEPARTMENT_OTHER): Payer: 59 | Admitting: Physical Therapy

## 2023-05-09 ENCOUNTER — Telehealth: Payer: 59 | Admitting: Nurse Practitioner

## 2023-05-09 DIAGNOSIS — H6690 Otitis media, unspecified, unspecified ear: Secondary | ICD-10-CM

## 2023-05-09 DIAGNOSIS — J0110 Acute frontal sinusitis, unspecified: Secondary | ICD-10-CM | POA: Diagnosis not present

## 2023-05-09 MED ORDER — AMOXICILLIN-POT CLAVULANATE 875-125 MG PO TABS
1.0000 | ORAL_TABLET | Freq: Two times a day (BID) | ORAL | 0 refills | Status: AC
Start: 2023-05-09 — End: 2023-05-16

## 2023-05-09 MED ORDER — FLUTICASONE PROPIONATE 50 MCG/ACT NA SUSP: 2.0000 | Freq: Every day | NASAL | 6 refills | Status: AC

## 2023-05-09 NOTE — Progress Notes (Signed)
Virtual Visit Consent   [name], you are scheduled for a virtual visit with a [facility] provider today. Just as with appointments in the office, your consent must be obtained to participate. Your consent will be active for this visit and any virtual visit you may have with one of our providers in the next 365 days. If you have a MyChart account, a copy of this consent can be sent to you electronically.  As this is a virtual visit, video technology does not allow for your provider to perform a traditional examination. This may limit your provider's ability to fully assess your condition. If your provider identifies any concerns that need to be evaluated in person or the need to arrange testing (such as labs, EKG, etc.), we will make arrangements to do so. Although advances in technology are sophisticated, we cannot ensure that it will always work on either your end or our end. If the connection with a video visit is poor, the visit may have to be switched to a telephone visit. With either a video or telephone visit, we are not always able to ensure that we have a secure connection.  By engaging in this virtual visit, you consent to the provision of healthcare and authorize for your insurance to be billed (if applicable) for the services provided during this visit. Depending on your insurance coverage, you may receive a charge related to this service.  I need to obtain your verbal consent now. Are you willing to proceed with your visit today? ORACIO LEATHERS has provided verbal consent on 05/09/2023 for a virtual visit (video or telephone). Viviano Simas, FNP  Date: 05/09/2023 2:14 PM  Virtual Visit via Video Note   I, Viviano Simas, connected with  Micheal Mckinney  ([id number], March 19, 1971) on 05/09/23 at  2:15 PM EDT by a video-enabled telemedicine application and verified that I am speaking with the correct person using two identifiers.  Location: Patient: Virtual Visit Location  Patient: Home Provider: Virtual Visit Location Provider: Home Office   I discussed the limitations of evaluation and management by telemedicine and the availability of in person appointments. The patient expressed understanding and agreed to proceed.    History of Present Illness: Micheal Mckinney is a 52 y.o. who identifies as a male who was assigned male at birth, and is being seen today for fever that has been ongoing for the past 4-5 days.   He has a grand daughter that has been sick and he has been exposed  Possible COVID in that house, patient has not been tested.    Symptoms today are body aches, sinus congestion and worsening bilateral ear pain Dry cough, post nasal drainage. Denies SOB, tightness in chest or wheezing   He has had a history of ear infections in the past   He has been using Aleve and cough drops   Denies a history of asthma or need for inhalers int he past    Problems:  Patient Active Problem List   Diagnosis Date Noted   Nontraumatic complete tear of left rotator cuff 07/23/2022   Biceps tendinitis of left upper extremity 07/23/2022   Visit for preventive health examination 10/31/2015   Acute drug-induced gout of right foot 08/15/2015   Hyperlipidemia 05/10/2013   Routine general medical examination at a health care facility 05/07/2013   Carpal tunnel syndrome 01/12/2013   HTN (hypertension) 01/26/2012   OSA (obstructive sleep apnea) 01/26/2012   Obesity 01/26/2012    Allergies:  Allergies  Allergen Reactions   Chlorhexidine Dermatitis   Medications:  Current Outpatient Medications:    aspirin EC 325 MG tablet, Take 1 tablet (325 mg total) by mouth daily. (Patient taking differently: Take 81 mg by mouth daily.), Disp: 30 tablet, Rfl: 0   atenolol (TENORMIN) 50 MG tablet, TAKE ONE TABLET BY MOUTH DAILY, Disp: 30 tablet, Rfl: 6   ezetimibe (ZETIA) 10 MG tablet, TAKE ONE TABLET BY MOUTH DAILY, Disp: 90 tablet, Rfl: 3   ranitidine (ZANTAC) 150 MG  tablet, Take 1 tablet by mouth as needed., Disp: , Rfl:    rosuvastatin (CRESTOR) 5 MG tablet, TAKE ONE TABLET BY MOUTH DAILY, Disp: 30 tablet, Rfl: 6   telmisartan (MICARDIS) 40 MG tablet, TAKE ONE TABLET BY MOUTH DAILY, Disp: 90 tablet, Rfl: 3  Observations/Objective: Patient is well-developed, well-nourished in no acute distress.  Resting comfortably  at home.  Head is normocephalic, atraumatic.  No labored breathing.  Speech is clear and coherent with logical content.  Patient is alert and oriented at baseline.    Assessment and Plan:  1. Acute non-recurrent frontal sinusitis  - amoxicillin-clavulanate (AUGMENTIN) 875-125 MG tablet; Take 1 tablet by mouth 2 (two) times daily for 7 days.  Dispense: 14 tablet; Refill: 0 - fluticasone (FLONASE) 50 MCG/ACT nasal spray; Place 2 sprays into both nostrils daily.  Dispense: 16 g; Refill: 6  2. Acute otitis media, unspecified otitis media type  - amoxicillin-clavulanate (AUGMENTIN) 875-125 MG tablet; Take 1 tablet by mouth 2 (two) times daily for 7 days.  Dispense: 14 tablet; Refill: 0 - fluticasone (FLONASE) 50 MCG/ACT nasal spray; Place 2 sprays into both nostrils daily.  Dispense: 16 g; Refill: 6      Follow Up Instructions: I discussed the assessment and treatment plan with the patient. The patient was provided an opportunity to ask questions and all were answered. The patient agreed with the plan and demonstrated an understanding of the instructions.  A copy of instructions were sent to the patient via MyChart unless otherwise noted below.    The patient was advised to call back or seek an in-person evaluation if the symptoms worsen or if the condition fails to improve as anticipated.    Viviano Simas, FNP

## 2023-05-19 ENCOUNTER — Ambulatory Visit: Payer: 59 | Admitting: Family Medicine

## 2023-05-21 ENCOUNTER — Ambulatory Visit (HOSPITAL_BASED_OUTPATIENT_CLINIC_OR_DEPARTMENT_OTHER): Payer: 59 | Attending: Orthopaedic Surgery | Admitting: Physical Therapy

## 2023-06-04 ENCOUNTER — Encounter: Payer: Self-pay | Admitting: Family Medicine

## 2023-06-04 ENCOUNTER — Encounter (HOSPITAL_BASED_OUTPATIENT_CLINIC_OR_DEPARTMENT_OTHER): Payer: Self-pay | Admitting: Physical Therapy

## 2023-06-04 ENCOUNTER — Ambulatory Visit: Payer: 59 | Admitting: Family Medicine

## 2023-06-04 ENCOUNTER — Ambulatory Visit (HOSPITAL_BASED_OUTPATIENT_CLINIC_OR_DEPARTMENT_OTHER): Payer: 59 | Attending: Orthopaedic Surgery | Admitting: Physical Therapy

## 2023-06-04 VITALS — BP 130/78 | HR 66 | Temp 98.3°F | Ht 68.75 in | Wt 294.2 lb

## 2023-06-04 DIAGNOSIS — M25612 Stiffness of left shoulder, not elsewhere classified: Secondary | ICD-10-CM | POA: Diagnosis present

## 2023-06-04 DIAGNOSIS — E785 Hyperlipidemia, unspecified: Secondary | ICD-10-CM

## 2023-06-04 DIAGNOSIS — I10 Essential (primary) hypertension: Secondary | ICD-10-CM

## 2023-06-04 DIAGNOSIS — G8929 Other chronic pain: Secondary | ICD-10-CM | POA: Insufficient documentation

## 2023-06-04 DIAGNOSIS — E875 Hyperkalemia: Secondary | ICD-10-CM

## 2023-06-04 DIAGNOSIS — Z1211 Encounter for screening for malignant neoplasm of colon: Secondary | ICD-10-CM

## 2023-06-04 DIAGNOSIS — M25512 Pain in left shoulder: Secondary | ICD-10-CM | POA: Diagnosis present

## 2023-06-04 DIAGNOSIS — M6281 Muscle weakness (generalized): Secondary | ICD-10-CM | POA: Diagnosis present

## 2023-06-04 DIAGNOSIS — Z87898 Personal history of other specified conditions: Secondary | ICD-10-CM

## 2023-06-04 DIAGNOSIS — R7303 Prediabetes: Secondary | ICD-10-CM

## 2023-06-04 LAB — COMPREHENSIVE METABOLIC PANEL
ALT: 22 U/L (ref 0–53)
AST: 17 U/L (ref 0–37)
Albumin: 4.3 g/dL (ref 3.5–5.2)
Alkaline Phosphatase: 66 U/L (ref 39–117)
BUN: 15 mg/dL (ref 6–23)
CO2: 28 meq/L (ref 19–32)
Calcium: 10.2 mg/dL (ref 8.4–10.5)
Chloride: 106 meq/L (ref 96–112)
Creatinine, Ser: 0.81 mg/dL (ref 0.40–1.50)
GFR: 101.29 mL/min (ref >60.00)
Glucose, Bld: 97 mg/dL (ref 70–99)
Potassium: 4.3 meq/L (ref 3.5–5.1)
Sodium: 139 meq/L (ref 135–145)
Total Bilirubin: 0.8 mg/dL (ref 0.2–1.2)
Total Protein: 6.8 g/dL (ref 6.0–8.3)

## 2023-06-04 LAB — LIPID PANEL
Cholesterol: 116 mg/dL (ref 0–200)
HDL: 29.3 mg/dL — ABNORMAL LOW (ref >39.00)
LDL Cholesterol: 69 mg/dL (ref 0–99)
NonHDL: 86.29
Total CHOL/HDL Ratio: 4
Triglycerides: 84 mg/dL (ref 0.0–149.0)
VLDL: 16.8 mg/dL (ref 0.0–40.0)

## 2023-06-04 LAB — HEMOGLOBIN A1C: Hgb A1c MFr Bld: 5.8 % (ref 4.6–6.5)

## 2023-06-04 MED ORDER — ROSUVASTATIN CALCIUM 5 MG PO TABS: 5.0000 mg | ORAL_TABLET | Freq: Every day | ORAL | 6 refills | Status: DC

## 2023-06-04 MED ORDER — TELMISARTAN 40 MG PO TABS: 40.0000 mg | ORAL_TABLET | Freq: Every day | ORAL | 3 refills | Status: DC

## 2023-06-04 MED ORDER — ATENOLOL 50 MG PO TABS: 50.0000 mg | ORAL_TABLET | Freq: Every day | ORAL | 6 refills | Status: DC

## 2023-06-04 MED ORDER — EZETIMIBE 10 MG PO TABS: 10.0000 mg | ORAL_TABLET | Freq: Every day | ORAL | 3 refills | Status: DC

## 2023-06-04 NOTE — Progress Notes (Signed)
Subjective:  Patient ID: Micheal Mckinney, male    DOB: Aug 28, 1970  Age: 52 y.o. MRN: [id number]  CC:  Chief Complaint  Patient presents with   Medical Management of Chronic Issues    Pt doing well, notes no questions, reports as of a month ago quit nicotine     HPI [name] presents for  Follow up. Doing well. 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  Shoulder doing better - strength improved.   Hypertension: With history of OSA, unable to tolerate CPAP, improved with side sleeping.  Hypertension treated with telmisartan, atenolol.  No new med side effects.  Cardiology Dr. [name]. Hyperkalemia on labs in May. 5.7, has not been repeated. No K supplement.  Home readings: none. No feeling of elevated blood pressure.  BP Readings from Last 3 Encounters:  06/04/23 130/78  11/13/22 128/68  07/23/22 125/70   Lab Results  Component Value Date   CREATININE 0.91 11/13/2022   Hyperlipidemia: With elevated coronary calcium scoring treated with Crestor, Zetia and target LDL of 70 or lower, followed by lipid clinic, Dr. [name]. Lab Results  Component Value Date   CHOL 116 11/13/2022   HDL 40.00 11/13/2022   LDLCALC 67 11/13/2022   LDLDIRECT 169.6 04/13/2014   TRIG 46.0 11/13/2022   CHOLHDL 3 11/13/2022   Lab Results  Component Value Date   ALT 26 11/13/2022   AST 25 11/13/2022   ALKPHOS 48 11/13/2022   BILITOT 0.7 11/13/2022   Prediabetes: With obesity, normal A1c last May. Weight has increased slightly from May, but he has quit use of chewing tobacco in the past month. Had flu like illness. Could not taste. Stopped cold [place].  Plans on weight loss prior to next visit.  Remodeling project planned at home.   Lab Results  Component Value Date   HGBA1C 5.5 11/16/2021   Wt Readings from Last 3 Encounters:  06/04/23 294 lb 3.2 oz (133.4 kg)  11/13/22 287 lb 6.4 oz (130.4 kg)  07/23/22 281 lb 12 oz (127.8 kg)   Screening  options with colonoscopy versus Cologuard discussed. Discussed timing of repeat testing intervals if normal, as well as potential need for diagnostic Colonoscopy if positive Cologuard. Understanding expressed, and chose Cologuard.  Prior box wet in rain, and did not receive replacement. Reordered today.   History Patient Active Problem List   Diagnosis Date Noted   Nontraumatic complete tear of left rotator cuff 07/23/2022   Biceps tendinitis of left upper extremity 07/23/2022   Visit for preventive health examination 10/31/2015   Acute drug-induced gout of right foot 08/15/2015   Hyperlipidemia 05/10/2013   Routine general medical examination at a health care facility 05/07/2013   Carpal tunnel syndrome 01/12/2013   HTN (hypertension) 01/26/2012   OSA (obstructive sleep apnea) 01/26/2012   Obesity 01/26/2012   Past Medical History:  Diagnosis Date   Allergy    hay fever   Foreign body (FB) in soft tissue    right palm   GERD (gastroesophageal reflux disease)    History of chicken pox    Hyperlipidemia    Hypertension    OSA (obstructive sleep apnea) 01/26/2012   does not use CPAP   Past Surgical History:  Procedure Laterality Date   CARPAL TUNNEL RELEASE Bilateral 05/04/2013   CERVICAL FUSION     Alternative to this done in 05/2017, disc replacement   FOREIGN BODY REMOVAL Right 05/18/2018   Procedure: REMOVAL FOREIGN BODY RIGHT PALM;  Surgeon: Betha Loa, MD;  Location: [facility];  Service: Orthopedics;  Laterality: Right;   IRRIGATION AND DEBRIDEMENT SEBACEOUS CYST  2009    on back and neck   SHOULDER ARTHROSCOPY WITH ROTATOR CUFF REPAIR AND OPEN BICEPS TENODESIS Left 07/23/2022   Procedure: LEFT SHOULDER ARTHROSCOPY WITH ROTATOR CUFF REPAIR AND BICEPS TENODESIS;  Surgeon: Huel Cote, MD;  Location: [facility];  Service: Orthopedics;  Laterality: Left;   Allergies  Allergen Reactions   Chlorhexidine Dermatitis   Prior to Admission  medications   Medication Sig Start Date End Date Taking? Authorizing Provider  aspirin EC 325 MG tablet Take 1 tablet (325 mg total) by mouth daily. Patient taking differently: Take 81 mg by mouth daily. 06/28/22  Yes Huel Cote, MD  atenolol (TENORMIN) 50 MG tablet TAKE ONE TABLET BY MOUTH DAILY 12/10/22  Yes Shade Flood, MD  ezetimibe (ZETIA) 10 MG tablet TAKE ONE TABLET BY MOUTH DAILY 09/26/22  Yes Shade Flood, MD  fluticasone [facility]) 50 MCG/ACT nasal spray Place 2 sprays into both nostrils daily. 05/09/23  Yes Viviano Simas, FNP  ranitidine (ZANTAC) 150 MG tablet Take 1 tablet by mouth as needed.   Yes [provider]  rosuvastatin (CRESTOR) 5 MG tablet TAKE ONE TABLET BY MOUTH DAILY 12/10/22  Yes Shade Flood, MD  telmisartan (MICARDIS) 40 MG tablet TAKE ONE TABLET BY MOUTH DAILY 12/10/22  Yes Shade Flood, MD   Social History   Socioeconomic History   Marital status: Married    Spouse name: Not on file   Number of children: Not on file   Years of education: Not on file   Highest education level: Not on file  Occupational History   Not on file  Tobacco Use   Smoking status: Former    Current packs/day: 0.00    Average packs/day: 0.3 packs/day for 25.0 years (6.3 ttl pk-yrs)    Types: Cigarettes    Start date: 01/16/1995    Quit date: 01/16/2020    Years since quitting: 3.3   Smokeless tobacco: Current   Tobacco comments:    Chewing tobacco  Vaping Use   Vaping status: Never Used  Substance and Sexual Activity   Alcohol use: No    Alcohol/week: 0.0 standard drinks of alcohol   Drug use: No   Sexual activity: Yes  Other Topics Concern   Not on file  Social History Narrative   Lives with wife and dogs   Social Determinants of Health   Financial Resource Strain: Not on file  Food Insecurity: Not on file  Transportation Needs: Not on file  Physical Activity: Not on file  Stress: Not on file  Social Connections: Unknown (11/26/2021)    Received from [facility]   Social Network    Social Network: Not on file  Intimate Partner Violence: Unknown (10/17/2021)   Received from [facility], [facility]   HITS    Physically Hurt: Not on file    Insult or Talk Down To: Not on file    Threaten Physical Harm: Not on file    Scream or Curse: Not on file    Review of Systems  Constitutional:  Negative for fatigue and unexpected weight change.  Eyes:  Negative for visual disturbance.  Respiratory:  Negative for cough, chest tightness and shortness of breath.   Cardiovascular:  Negative for chest pain, palpitations and leg swelling.  Gastrointestinal:  Negative for abdominal pain and blood in stool.  Neurological:  Negative for dizziness, light-headedness and headaches.     Objective:   Vitals:   06/04/23 1007  BP: 130/78  Pulse: 66  Temp: 98.3 F (36.8 C)  TempSrc: Temporal  SpO2: 98%  Weight: 294 lb 3.2 oz (133.4 kg)  Height: 5' 8.75" (1.746 m)    Physical Exam Vitals reviewed.  Constitutional:      Appearance: He is well-developed.  HENT:     Head: Normocephalic and atraumatic.  Neck:     Vascular: No carotid bruit or JVD.  Cardiovascular:     Rate and Rhythm: Normal rate and regular rhythm.     Heart sounds: Normal heart sounds. No murmur heard. Pulmonary:     Effort: Pulmonary effort is normal.     Breath sounds: Normal breath sounds. No rales.  Musculoskeletal:     Right lower leg: No edema.     Left lower leg: No edema.  Skin:    General: Skin is warm and dry.  Neurological:     Mental Status: He is alert and oriented to person, place, and time.  Psychiatric:        Mood and Affect: Mood normal.        Assessment & Plan:  TRAVIS SALER is a 52 y.o. male . Hyperlipidemia, unspecified hyperlipidemia type - Plan: Comprehensive metabolic panel, Lipid panel, ezetimibe (ZETIA) 10 MG tablet, rosuvastatin (CRESTOR) 5 MG tablet  -  Stable, tolerating current regimen.  Medications refilled. Labs pending as above.   Prediabetes - Plan: Comprehensive metabolic panel, Hemoglobin A1c History of prediabetes with most recent A1c last year in the normal range.  Weight has increased slightly, recheck A1c.  Plans on weight loss.  Screening for colon cancer - Plan: Cologuard  -Repeat Cologuard ordered.  Advised to let me know if he does not receive that kit.  Essential hypertension - Plan: atenolol (TENORMIN) 50 MG tablet, telmisartan (MICARDIS) 40 MG tablet, hyperkalemia.  -  Stable, tolerating current regimen. Medications refilled. Labs pending as above.  Adjustment plan accordingly based on labs including with prior hyperkalemia.  Denies use of potassium supplements.   Meds ordered this encounter  Medications   atenolol (TENORMIN) 50 MG tablet    Sig: Take 1 tablet (50 mg total) by mouth daily.    Dispense:  30 tablet    Refill:  6   ezetimibe (ZETIA) 10 MG tablet    Sig: Take 1 tablet (10 mg total) by mouth daily.    Dispense:  90 tablet    Refill:  3   rosuvastatin (CRESTOR) 5 MG tablet    Sig: Take 1 tablet (5 mg total) by mouth daily.    Dispense:  30 tablet    Refill:  6   telmisartan (MICARDIS) 40 MG tablet    Sig: Take 1 tablet (40 mg total) by mouth daily.    Dispense:  90 tablet    Refill:  3   Patient Instructions  Thanks for coming in today.  Congrats on quitting tobacco! No med changes at this time. If any lab concerns I will let you know. Take care.     Signed,   Meredith Staggers, MD [facility], [facility] 06/04/23 10:39 AM

## 2023-06-04 NOTE — Patient Instructions (Addendum)
Thanks for coming in today.  Congrats on quitting tobacco! No med changes at this time. If any lab concerns I will let you know. Take care.

## 2023-06-04 NOTE — Therapy (Signed)
OUTPATIENT PHYSICAL THERAPY SHOULDER TREATMENT   Patient Name: ALBY BURRUEL MRN: [id number] DOB:1970/09/10, 52 y.o., male Today's Date: 06/04/2023  END OF SESSION:  PT End of Session - 06/04/23 0808     Visit Number 15    Number of Visits 30    Date for PT Re-Evaluation 09/02/23    Authorization Type [employer]    Authorization Time Period 07/25/22 to 10/04/22    PT Start Time 0801    PT Stop Time 0840    PT Time Calculation (min) 39 min    Activity Tolerance Patient tolerated treatment well    Behavior During Therapy [facility] for tasks assessed/performed                     Past Medical History:  Diagnosis Date   Allergy    hay fever   Foreign body (FB) in soft tissue    right palm   GERD (gastroesophageal reflux disease)    History of chicken pox    Hyperlipidemia    Hypertension    OSA (obstructive sleep apnea) 01/26/2012   does not use CPAP   Past Surgical History:  Procedure Laterality Date   CARPAL TUNNEL RELEASE Bilateral 05/04/2013   CERVICAL FUSION     Alternative to this done in 05/2017, disc replacement   FOREIGN BODY REMOVAL Right 05/18/2018   Procedure: REMOVAL FOREIGN BODY RIGHT PALM;  Surgeon: Betha Loa, MD;  Location: [facility];  Service: Orthopedics;  Laterality: Right;   IRRIGATION AND DEBRIDEMENT SEBACEOUS CYST  2009    on back and neck   SHOULDER ARTHROSCOPY WITH ROTATOR CUFF REPAIR AND OPEN BICEPS TENODESIS Left 07/23/2022   Procedure: LEFT SHOULDER ARTHROSCOPY WITH ROTATOR CUFF REPAIR AND BICEPS TENODESIS;  Surgeon: Huel Cote, MD;  Location: [facility];  Service: Orthopedics;  Laterality: Left;   Patient Active Problem List   Diagnosis Date Noted   Nontraumatic complete tear of left rotator cuff 07/23/2022   Biceps tendinitis of left upper extremity 07/23/2022   Visit for preventive health examination 10/31/2015   Acute drug-induced gout of right foot 08/15/2015   Hyperlipidemia 05/10/2013    Routine general medical examination at a health care facility 05/07/2013   Carpal tunnel syndrome 01/12/2013   HTN (hypertension) 01/26/2012   OSA (obstructive sleep apnea) 01/26/2012   Obesity 01/26/2012    PCP: Meredith Staggers MD   REFERRING PROVIDER: Huel Cote, MD  REFERRING DIAG:  [phone] (ICD-10-CM) - Nontraumatic complete tear of left rotator cuff    THERAPY DIAG:  Chronic left shoulder pain  Muscle weakness (generalized)  Stiffness of left shoulder, not elsewhere classified  Rationale for Evaluation and Treatment: Rehabilitation  ONSET DATE:  07/23/22 DOS   Days since surgery: 316  SUBJECTIVE:  SUBJECTIVE STATEMENT:  Pt states the OH strength in general is much better. Pt states that up and back motion was the only time he had pain. Pain has not lingered.   Re-eval: Pt states the shoulder is still not 100% yet. The shoulder will continue to hurt  with reaching BHB. Putting on a belt and reaching behind him in a car is painful. Pt states the strength is nowhere near where it was before. Feels strength on L side is about 65% of previous. Feels a pinching sharp pain at the top of the L shoulder.   Eval: Had surgery 07/23/22 for shoulder scope, RCR, biceps work too. Haven't moved shoulder much I don't know what to do with it. BP was getting a bit high yesterday,  I stopped taking pain meds bc of this yesterday. I'm not feeling well today honestly.   PERTINENT HISTORY: FROM MD NOTE 52 year old male with left shoulder subscapularis tear as well as biceps tendinitis.  At this time he is trialed multiple months of the band strengthening program as well as multiple injections into both shoulders.  He is not getting any persistent relief from his last injection in the left shoulder.  That effect I do  believe that he would potentially be a candidate for arthroscopy with subscapularis repair and biceps tenodesis.  We discussed the limitations and restrictions associated with this.  We discussed the complications and possible outcomes associated with this.  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  He understands all of this.  He would like to proceed with left shoulder arthroscopy. Plan :     -Plan for left shoulder arthroscopy with subscapularis repair and biceps tenodesis  POSTOPERATIVE PLAN: He will follow the subscapularis and biceps tendon rehab protocol.  He may begin early active flexion in the scapular plane.  He will avoid active internal rotation or resisted external rotation.  He will follow-up in 2 weeks for suture removal.  He will be placed on aspirin for blood clot prevention.  PAIN:  Are you having pain? Yes: NPRS scale: "0.5/10; 7/10 at worst Pain location: L shoulder in general  Pain description: dull pain  Aggravating factors: reaching BHB  Relieving factors: unsure   PRECAUTIONS: Other: post op protocol   WEIGHT BEARING RESTRICTIONS: Yes NWB surgical UE   FALLS:  Has patient fallen in last 6 months? 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 Steward Drone in about 2 weeks   OBJECTIVE:   UPPER EXTREMITY ROM:   AROM scaption to 75 deg  Passive ROM Right eval Left eval L 1/18 L  8/16  AROM  Shoulder flexion  95* 95 155  Shoulder extension    50  Shoulder abduction  Scaption PROM 100* 80 140   Shoulder adduction      Shoulder internal rotation  Full IR PROM  with elbow at 0* ABD   Full L2 p!  Shoulder external rotation  Approximately 30* at 0* ABD  neutral 45  Elbow flexion      Elbow  extension      Wrist flexion      Wrist extension      Wrist ulnar deviation      Wrist radial deviation      Wrist pronation      Wrist supination      (Blank rows = not tested)  MMT Right 8/16 R  11/20  Left 8/16 L 11/20  Shoulder flexion 54.0  54.6 29.7 43.2  Shoulder extension 45.8 60.6 31.7 69.1  Shoulder abduction 65.0 58.3 20.3 47.4  Shoulder adduction       Shoulder internal rotation 60.0 60.1 55.0 57.2  Shoulder external rotation 27.1 26.5 20.9 28.7  (Blank rows = not tested)    TODAY'S TREATMENT:                                                                                                                                         DATE:   11/20  L GHJ inf glide in ABD and flexion grade III-IV Prone PA L GHJ glide grade IV  Testing results .   Program Notes Overhead carry 5lbs 10ft 3x laps  Exercises - Standing Shoulder Internal Rotation AAROM Behind Back with Towel   - 1 x daily - 7 x weekly - 2-3 sets - 5 reps - 5-10seconds hold - Standing Bicep Stretch at Wall  - 2 x daily - 7 x weekly - 1 sets - 3 reps - 30 hold - Single Arm Doorway Pec Stretch at 90 Degrees Abduction  - 2 x daily - 7 x weekly - 1 sets - 3 reps - 30 hold - Shoulder Abduction with Dumbbells - Thumbs Up  - 1 x daily - 3-4 x weekly - 3 sets - 8-10 reps - Shoulder Overhead Press in Flexion with Dumbbells  - 1 x daily - 3-4 x weekly - 3 sets - 8-10 reps - Supine Chest Flys  - 1 x daily - 3-4 x weekly - 3 sets - 8 reps - Push Up on Table  - 1 x daily - 3-4 x weekly - 4 sets - 6 reps - Standing Bent Over Single Arm Scapular Row with Table Support  - 1 x daily - 3-4 x weekly - 3 sets - 8 reps - Standing Single Arm Shoulder External Rotation in Abduction with Anchored Resistance  - 1 x daily - 3-4 x  weekly - 3 sets - 10 reps - Single Arm Shoulder Extension with Anchored Resistance  - 1 x daily - 3-4 x weekly - 3 sets - 10 reps    10/9  L GHJ inf glide in ABD and flexion grade III-IV Prone PA L GHJ glide grade IV  5lb supine pec fly 3x8  Med height table push up 4x6 Bent over row 35lbs 3x8 OH carry and walk 5lbs 50ft 3x laps     9/25 PROM L shoulder  GHJ mobilizations  STM to proximal biceps  Rhythmic stabilization supine at 90deg 30sec x2 Supine ABC 2# x2 Supine chest press L only 4# 2x10 Prone flexion 2# 3x10 Prone H abduction 2# 3x10 Prone extension 4# 2x10 Wall clocks RTB 2x10L Standing active flexion 2# x10 Standing active scaption 2# x10 OH ball rolls at wall 4 way x20ea 3.3#   9/11  L GHJ inf glide in ABD and flexion grade III-IV Prone PA L GHJ glide grade IV  Exercises - Standing Shoulder Internal Rotation AAROM Behind Back with Towel   - 1 x daily - 7 x weekly - 2-3 sets - 5 reps - 5-10seconds hold - Standing Bicep Stretch at Wall  - 2 x daily - 7 x weekly - 1 sets - 3 reps - 30 hold - Single Arm Doorway Pec Stretch at 90 Degrees Abduction  - 2 x daily - 7 x weekly - 1 sets - 3 reps - 30 hold - Shoulder Scaption AAROM with Dowel  - 1 x daily - 7 x weekly - 2 sets - 10 reps - 5 hold - Standing Shoulder Single Arm PNF D2 Flexion with Resistance  - 1 x daily - 4-5 x weekly - 2 sets - 10 reps - Shoulder Abduction with Dumbbells - Thumbs Up  - 1 x daily - 4-5 x weekly - 3 sets - 8-10 reps 2lb DB - Staggered Stance Single Arm Row with Anchored Resistance  - 1 x daily - 4-5 x weekly - 3 sets - 10 reps black TB - Shoulder Overhead Press in Flexion with Dumbbells  - 1 x daily - 4-5 x weekly - 3 sets - 8-10 reps 2lb DB  8/16  L GHJ inf glide in ABD and flexion grade III-IV  Exercises - Standing Shoulder Internal Rotation AAROM Behind Back with Towel   2-3 sets - 5 reps - 5-10seconds hold - Single Arm Doorway Pec Stretch at 90 Degrees Abduction  1 sets - 3 reps  - 30 hold - Standing Shoulder Extension with Dowel  -2 sets - 10 reps - 5 hold - Standing Shoulder Row with Blue TB Anchored Resistance  -3 sets - 10 reps - Shoulder Scaption AAROM with Dowel  -- 2 sets - 10 reps - 5 hold - Standing Shoulder Blue TB Single Arm PNF D2 Flexion with Resistance  -- 2 sets - 10 reps    PATIENT EDUCATION: Education details: anatomy, exercise progression, DOMS expectations, envelope of function, HEP, POC  Person educated: Patient Education method: Explanation, Demonstration, and Handouts Education comprehension: verbalized understanding, returned demonstration, and needs further education  HOME EXERCISE PROGRAM:  Access Code: [email or web address] URL: [email or web address] Date: 08/01/2022 Prepared by: Zebedee Iba  ASSESSMENT:  CLINICAL IMPRESSION: Pt strength testing has improved greatly with most measurements being within the ~80% range. Pt HEP updated now to include more OH strength and 90/90 rotational cuff strength to aide in full return to work duties. Pt to schedule in 8 wks for likely D/C as pt continues to work on independent strengthening. Pt would benefit from continued skilled therapy in order to reach goals and maximize functional L UE strength and ROM for full return to PLOF and occupation.   OBJECTIVE IMPAIRMENTS: decreased ROM, decreased strength, hypomobility, increased edema, increased fascial restrictions, increased muscle spasms, impaired flexibility, postural dysfunction, obesity, and pain.   ACTIVITY LIMITATIONS: carrying, lifting, bathing, toileting, dressing, reach over head, hygiene/grooming, and caring for others  PARTICIPATION LIMITATIONS: driving, shopping, community activity, and occupation  PERSONAL FACTORS: Behavior  pattern, Education, Fitness, and Past/current experiences are also affecting patient's functional outcome.   REHAB POTENTIAL: Good  CLINICAL DECISION MAKING: Stable/uncomplicated  EVALUATION COMPLEXITY:  Low   GOALS: Goals reviewed with patient? Yes  SHORT TERM GOALS: Target date: 04/11/2023     Will be compliant with appropriate progressive HEP as appropriate post-op Baseline: Goal status: MET 08/19/22  2.  Surgical shoulder AROM and PROM to be full without increased pain  Baseline:  Goal status: MET  3.  Pain in surgical shoulder to be no more than 2/10 with functional activities in PT and HEP  Baseline:  Goal status: met  4.  Will have better understanding of postural liimitations and general ergonomics  Baseline:  Goal status: met    LONG TERM GOALS: Target date:  09/02/2023      MMT in surgical shoulder to be at least 4/5 in all tested groups  Baseline:  Goal status: MET  2.  Will have been able to return to work on limited duty basis as deemed appropriate by MD without increase in pain  Baseline:  Goal status: MET  3.  Will be able to reach overhead to pull a fan or light switch with surgical UE without increase in pain Baseline:  Goal status: MET  4.  Pain in surgical shoulder with functional activities at home and work to be no more than 2/10 Baseline:  Goal status: ongoing  5. Pt will be able to demonstrate  at least 90% HHD strength testing in order to demonstrate functional improvement in UE function for full return to work duties.    Goal status INITIAL    PLAN:  PT FREQUENCY: 1x/ every 4 wks or more  PT DURATION: 12 weeks  PLANNED INTERVENTIONS: Therapeutic exercises, Therapeutic activity, Neuromuscular re-education, Balance training, Gait training, Patient/Family education, Self Care, Joint mobilization, Aquatic Therapy, Dry Needling, Electrical stimulation, Cryotherapy, Moist heat, Taping, Ultrasound, Ionotophoresis 4mg /ml Dexamethasone, Manual therapy, and Re-evaluation  PLAN FOR NEXT SESSION: L GHJ joint mobility, L shoulder strength in all planes, D1/D2 strength and stretching, combined motion stretching  Zebedee Iba PT, DPT 06/04/23 8:44  AM

## 2023-06-16 ENCOUNTER — Telehealth: Payer: Self-pay

## 2023-06-16 NOTE — Telephone Encounter (Signed)
Electrolytes, kidney, liver tests were normal.  49-month blood sugar test was slightly higher at 5.8.  Continue to watch diet, exercise and let's recheck that level in the next 6 months.  Cholesterol levels were overall stable from previous readings.  Let me know if you have questions.   Dr. Neva Seat

## 2023-06-16 NOTE — Telephone Encounter (Signed)
Pt returned phone call. Asked for a call back to discuss lab results

## 2023-06-16 NOTE — Telephone Encounter (Signed)
 Lab results have been discussed.   Verbalized understanding? Yes  Are there any questions? No

## 2023-06-18 ENCOUNTER — Encounter (HOSPITAL_BASED_OUTPATIENT_CLINIC_OR_DEPARTMENT_OTHER): Payer: 59 | Admitting: Physical Therapy

## 2023-07-24 ENCOUNTER — Ambulatory Visit: Payer: 59 | Admitting: Family Medicine

## 2023-08-29 ENCOUNTER — Encounter (HOSPITAL_BASED_OUTPATIENT_CLINIC_OR_DEPARTMENT_OTHER): Payer: Self-pay | Admitting: Orthopaedic Surgery

## 2023-08-29 ENCOUNTER — Ambulatory Visit (HOSPITAL_BASED_OUTPATIENT_CLINIC_OR_DEPARTMENT_OTHER): Payer: 59 | Admitting: Orthopaedic Surgery

## 2023-08-29 DIAGNOSIS — M75122 Complete rotator cuff tear or rupture of left shoulder, not specified as traumatic: Secondary | ICD-10-CM | POA: Diagnosis not present

## 2023-08-29 NOTE — Progress Notes (Signed)
Post Operative Evaluation    Procedure/Date of Surgery: Left shoulder arthroscopy with rotator cuff debridement biceps tenodesis 07/23/22  Interval History:   Presents today for follow-up status post the above procedure.  Overall he is continuing to improve.  The shoulder does occasionally flare and get swollen as he reaches although he appears to have plateaued  PMH/PSH/Family History/Social History/Meds/Allergies:    Past Medical History:  Diagnosis Date   Allergy    hay fever   Foreign body (FB) in soft tissue    right palm   GERD (gastroesophageal reflux disease)    History of chicken pox    Hyperlipidemia    Hypertension    OSA (obstructive sleep apnea) 01/26/2012   does not use CPAP   Past Surgical History:  Procedure Laterality Date   CARPAL TUNNEL RELEASE Bilateral 05/04/2013   CERVICAL FUSION     Alternative to this done in 05/2017, disc replacement   FOREIGN BODY REMOVAL Right 05/18/2018   Procedure: REMOVAL FOREIGN BODY RIGHT PALM;  Surgeon: Betha Loa, MD;  Location: [facility];  Service: Orthopedics;  Laterality: Right;   IRRIGATION AND DEBRIDEMENT SEBACEOUS CYST  2009    on back and neck   SHOULDER ARTHROSCOPY WITH ROTATOR CUFF REPAIR AND OPEN BICEPS TENODESIS Left 07/23/2022   Procedure: LEFT SHOULDER ARTHROSCOPY WITH ROTATOR CUFF REPAIR AND BICEPS TENODESIS;  Surgeon: Huel Cote, MD;  Location: [facility];  Service: Orthopedics;  Laterality: Left;   Social History   Socioeconomic History   Marital status: Married    Spouse name: Not on file   Number of children: Not on file   Years of education: Not on file   Highest education level: 12th grade  Occupational History   Not on file  Tobacco Use   Smoking status: Former    Current packs/day: 0.00    Average packs/day: 0.3 packs/day for 25.0 years (6.3 ttl pk-yrs)    Types: Cigarettes    Start date: 01/16/1995    Quit date: 01/16/2020     Years since quitting: 3.6   Smokeless tobacco: Current   Tobacco comments:    Chewing tobacco  Vaping Use   Vaping status: Never Used  Substance and Sexual Activity   Alcohol use: No    Alcohol/week: 0.0 standard drinks of alcohol   Drug use: No   Sexual activity: Yes  Other Topics Concern   Not on file  Social History Narrative   Lives with wife and dogs   Social Drivers of Health   Financial Resource Strain: Low Risk  (07/23/2023)   Overall Financial Resource Strain (CARDIA)    Difficulty of Paying Living Expenses: Not hard at all  Food Insecurity: No Food Insecurity (07/23/2023)   Hunger Vital Sign    Worried About Running Out of Food in the Last Year: Never true    Ran Out of Food in the Last Year: Never true  Transportation Needs: No Transportation Needs (07/23/2023)   PRAPARE - Administrator, Civil Service (Medical): No    Lack of Transportation (Non-Medical): No  Physical Activity: Not on file  Stress: No Stress Concern Present (07/23/2023)   [employer] of Occupational Health - Occupational Stress Questionnaire    Feeling of Stress : Not at all  Social Connections: Unknown (07/23/2023)  Social Advertising account executive [NHANES]    Frequency of Communication with Friends and Family: Once a week    Frequency of Social Gatherings with Friends and Family: Not on file    Attends Religious Services: Never    Database administrator or Organizations: No    Attends Engineer, structural: Not on file    Marital Status: Married   Family History  Problem Relation Age of Onset   Cancer Mother        breast   Stroke Father    Allergies  Allergen Reactions   Chlorhexidine Dermatitis   Current Outpatient Medications  Medication Sig Dispense Refill   aspirin EC 325 MG tablet Take 1 tablet (325 mg total) by mouth daily. (Patient taking differently: Take 81 mg by mouth daily.) 30 tablet 0   atenolol (TENORMIN) 50 MG tablet Take 1 tablet (50 mg total)  by mouth daily. 30 tablet 6   ezetimibe (ZETIA) 10 MG tablet Take 1 tablet (10 mg total) by mouth daily. 90 tablet 3   fluticasone (FLONASE) 50 MCG/ACT nasal spray Place 2 sprays into both nostrils daily. 16 g 6   ranitidine (ZANTAC) 150 MG tablet Take 1 tablet by mouth as needed.     rosuvastatin (CRESTOR) 5 MG tablet Take 1 tablet (5 mg total) by mouth daily. 30 tablet 6   telmisartan (MICARDIS) 40 MG tablet Take 1 tablet (40 mg total) by mouth daily. 90 tablet 3   No current facility-administered medications for this visit.   No results found.  Review of Systems:   A ROS was performed including pertinent positives and negatives as documented in the HPI.   Musculoskeletal Exam:    There were no vitals taken for this visit.  Left shoulder incisions are well healing without erythema or drainage.  In the standing position he is able to forward elevate to 165 degrees although external rotation at the side is to 60 compared to 60 on the other side.  Internal rotation is to T12 today.  Remainder of distal neurosensory exam is intact  Imaging:      I personally reviewed and interpreted the radiographs.   Assessment:   1 year status post left shoulder rotator cuff debridement and biceps tenodesis overall doing well.  Overall I do believe he has reached a plateau with regard to the shoulder.  Given this I do believe he has reached maximum medical improvement.  I would like to continue to follow him in 3 months for the elbows and right shoulder as well.  I will plan to see him back in 12 weeks for reassessment Plan :    -Return to clinic in 12 weeks for reassessment        I personally saw and evaluated the patient, and participated in the management and treatment plan.  Huel Cote, MD Attending Physician, Orthopedic Surgery  This document was dictated using Dragon voice recognition software. A reasonable attempt at proof reading has been made to minimize errors.

## 2023-11-28 ENCOUNTER — Ambulatory Visit (HOSPITAL_BASED_OUTPATIENT_CLINIC_OR_DEPARTMENT_OTHER): Payer: 59 | Admitting: Orthopaedic Surgery

## 2023-11-28 DIAGNOSIS — M7581 Other shoulder lesions, right shoulder: Secondary | ICD-10-CM

## 2023-11-28 NOTE — Progress Notes (Signed)
 Post Operative Evaluation    Procedure/Date of Surgery: Left shoulder arthroscopy with rotator cuff debridement biceps tenodesis 07/23/22  Interval History:   Presents today overall feeling 90% improvement in the left shoulder.  At this time he is somewhat modifying his ability to work although he is continuing to improve and getting back to more normal life.  PMH/PSH/Family History/Social History/Meds/Allergies:    Past Medical History:  Diagnosis Date   Allergy    hay fever   Foreign body (FB) in soft tissue    right palm   GERD (gastroesophageal reflux disease)    History of chicken pox    Hyperlipidemia    Hypertension    OSA (obstructive sleep apnea) 01/26/2012   does not use CPAP   Past Surgical History:  Procedure Laterality Date   CARPAL TUNNEL RELEASE Bilateral 05/04/2013   CERVICAL FUSION     Alternative to this done in 05/2017, disc replacement   FOREIGN BODY REMOVAL Right 05/18/2018   Procedure: REMOVAL FOREIGN BODY RIGHT PALM;  Surgeon: Brunilda Capra, MD;  Location: [facility];  Service: Orthopedics;  Laterality: Right;   IRRIGATION AND DEBRIDEMENT SEBACEOUS CYST  2009    on back and neck   SHOULDER ARTHROSCOPY WITH ROTATOR CUFF REPAIR AND OPEN BICEPS TENODESIS Left 07/23/2022   Procedure: LEFT SHOULDER ARTHROSCOPY WITH ROTATOR CUFF REPAIR AND BICEPS TENODESIS;  Surgeon: Wilhelmenia Harada, MD;  Location: [facility];  Service: Orthopedics;  Laterality: Left;   Social History   Socioeconomic History   Marital status: Married    Spouse name: Not on file   Number of children: Not on file   Years of education: Not on file   Highest education level: 12th grade  Occupational History   Not on file  Tobacco Use   Smoking status: Former    Current packs/day: 0.00    Average packs/day: 0.3 packs/day for 25.0 years (6.3 ttl pk-yrs)    Types: Cigarettes    Start date: 01/16/1995    Quit date: 01/16/2020     Years since quitting: 3.8   Smokeless tobacco: Current   Tobacco comments:    Chewing tobacco  Vaping Use   Vaping status: Never Used  Substance and Sexual Activity   Alcohol use: No    Alcohol/week: 0.0 standard drinks of alcohol   Drug use: No   Sexual activity: Yes  Other Topics Concern   Not on file  Social History Narrative   Lives with wife and dogs   Social Drivers of Health   Financial Resource Strain: Low Risk  (07/23/2023)   Overall Financial Resource Strain (CARDIA)    Difficulty of Paying Living Expenses: Not hard at all  Food Insecurity: No Food Insecurity (07/23/2023)   Hunger Vital Sign    Worried About Running Out of Food in the Last Year: Never true    Ran Out of Food in the Last Year: Never true  Transportation Needs: No Transportation Needs (07/23/2023)   PRAPARE - Administrator, Civil Service (Medical): No    Lack of Transportation (Non-Medical): No  Physical Activity: Not on file  Stress: No Stress Concern Present (07/23/2023)   [employer] of Occupational Health - Occupational Stress Questionnaire    Feeling of Stress : Not at all  Social Connections: Unknown (07/23/2023)  Social Advertising account executive [NHANES]    Frequency of Communication with Friends and Family: Once a week    Frequency of Social Gatherings with Friends and Family: Not on file    Attends Religious Services: Never    Database administrator or Organizations: No    Attends Engineer, structural: Not on file    Marital Status: Married   Family History  Problem Relation Age of Onset   Cancer Mother        breast   Stroke Father    Allergies  Allergen Reactions   Chlorhexidine  Dermatitis   Current Outpatient Medications  Medication Sig Dispense Refill   aspirin  EC 325 MG tablet Take 1 tablet (325 mg total) by mouth daily. (Patient taking differently: Take 81 mg by mouth daily.) 30 tablet 0   atenolol  (TENORMIN ) 50 MG tablet Take 1 tablet (50 mg total)  by mouth daily. 30 tablet 6   ezetimibe  (ZETIA ) 10 MG tablet Take 1 tablet (10 mg total) by mouth daily. 90 tablet 3   fluticasone  (FLONASE ) 50 MCG/ACT nasal spray Place 2 sprays into both nostrils daily. 16 g 6   ranitidine (ZANTAC) 150 MG tablet Take 1 tablet by mouth as needed.     rosuvastatin  (CRESTOR ) 5 MG tablet Take 1 tablet (5 mg total) by mouth daily. 30 tablet 6   telmisartan  (MICARDIS ) 40 MG tablet Take 1 tablet (40 mg total) by mouth daily. 90 tablet 3   No current facility-administered medications for this visit.   No results found.  Review of Systems:   A ROS was performed including pertinent positives and negatives as documented in the HPI.   Musculoskeletal Exam:    There were no vitals taken for this visit.  Left shoulder incisions are well healing without erythema or drainage.  In the standing position he is able to forward elevate to 165 degrees although external rotation at the side is to 60 compared to 60 on the other side.  Internal rotation is to T12 today.  Remainder of distal neurosensory exam is intact  Imaging:      I personally reviewed and interpreted the radiographs.   Assessment:   status post left shoulder rotator cuff debridement and biceps tenodesis overall doing well.  At this time he is continuing to improve with only slight modifications to work.  He is overall in a very nice place in terms of his recovery.  I will plan to see him back as needed  -Return to clinic as needed        I personally saw and evaluated the patient, and participated in the management and treatment plan.  Wilhelmenia Harada, MD Attending Physician, Orthopedic Surgery  This document was dictated using Dragon voice recognition software. A reasonable attempt at proof reading has been made to minimize errors.

## 2023-12-04 ENCOUNTER — Encounter: Payer: 59 | Admitting: Family Medicine

## 2024-01-29 DIAGNOSIS — R002 Palpitations: Secondary | ICD-10-CM | POA: Insufficient documentation

## 2024-01-30 ENCOUNTER — Ambulatory Visit (INDEPENDENT_AMBULATORY_CARE_PROVIDER_SITE_OTHER): Admitting: Family Medicine

## 2024-01-30 ENCOUNTER — Encounter: Payer: Self-pay | Admitting: Family Medicine

## 2024-01-30 VITALS — BP 124/70 | HR 64 | Temp 98.3°F | Ht 70.0 in | Wt 288.2 lb

## 2024-01-30 DIAGNOSIS — E785 Hyperlipidemia, unspecified: Secondary | ICD-10-CM | POA: Diagnosis not present

## 2024-01-30 DIAGNOSIS — Z1159 Encounter for screening for other viral diseases: Secondary | ICD-10-CM

## 2024-01-30 DIAGNOSIS — Z125 Encounter for screening for malignant neoplasm of prostate: Secondary | ICD-10-CM

## 2024-01-30 DIAGNOSIS — Z0000 Encounter for general adult medical examination without abnormal findings: Secondary | ICD-10-CM | POA: Diagnosis not present

## 2024-01-30 DIAGNOSIS — R7303 Prediabetes: Secondary | ICD-10-CM | POA: Diagnosis not present

## 2024-01-30 DIAGNOSIS — I10 Essential (primary) hypertension: Secondary | ICD-10-CM

## 2024-01-30 DIAGNOSIS — Z130 Encounter for screening for diseases of the blood and blood-forming organs and certain disorders involving the immune mechanism: Secondary | ICD-10-CM | POA: Diagnosis not present

## 2024-01-30 DIAGNOSIS — Z114 Encounter for screening for human immunodeficiency virus [HIV]: Secondary | ICD-10-CM

## 2024-01-30 LAB — COMPREHENSIVE METABOLIC PANEL WITH GFR
ALT: 29 U/L (ref 0–53)
AST: 20 U/L (ref 0–37)
Albumin: 4.6 g/dL (ref 3.5–5.2)
Alkaline Phosphatase: 62 U/L (ref 39–117)
BUN: 14 mg/dL (ref 6–23)
CO2: 26 meq/L (ref 19–32)
Calcium: 10.3 mg/dL (ref 8.4–10.5)
Chloride: 105 meq/L (ref 96–112)
Creatinine, Ser: 0.74 mg/dL (ref 0.40–1.50)
GFR: 103.61 mL/min (ref >60.00)
Glucose, Bld: 94 mg/dL (ref 70–99)
Potassium: 4.9 meq/L (ref 3.5–5.1)
Sodium: 140 meq/L (ref 135–145)
Total Bilirubin: 0.7 mg/dL (ref 0.2–1.2)
Total Protein: 6.8 g/dL (ref 6.0–8.3)

## 2024-01-30 LAB — CBC
HCT: 43.8 % (ref 39.0–52.0)
Hemoglobin: 14.6 g/dL (ref 13.0–17.0)
MCHC: 33.4 g/dL (ref 30.0–36.0)
MCV: 85.1 fl (ref 78.0–100.0)
Platelets: 219 K/uL (ref 150.0–400.0)
RBC: 5.15 Mil/uL (ref 4.22–5.81)
RDW: 13.4 % (ref 11.5–15.5)
WBC: 7.2 K/uL (ref 4.0–10.5)

## 2024-01-30 LAB — LIPID PANEL
Cholesterol: 109 mg/dL (ref 0–200)
HDL: 37.4 mg/dL — ABNORMAL LOW (ref >39.00)
LDL Cholesterol: 60 mg/dL (ref 0–99)
NonHDL: 71.48
Total CHOL/HDL Ratio: 3
Triglycerides: 56 mg/dL (ref 0.0–149.0)
VLDL: 11.2 mg/dL (ref 0.0–40.0)

## 2024-01-30 LAB — PSA: PSA: 0.31 ng/mL (ref 0.10–4.00)

## 2024-01-30 LAB — HEMOGLOBIN A1C: Hgb A1c MFr Bld: 5.8 % (ref 4.6–6.5)

## 2024-01-30 MED ORDER — ROSUVASTATIN CALCIUM 5 MG PO TABS: 5.0000 mg | ORAL_TABLET | Freq: Every day | ORAL | 3 refills | Status: AC

## 2024-01-30 MED ORDER — EZETIMIBE 10 MG PO TABS: 10.0000 mg | ORAL_TABLET | Freq: Every day | ORAL | 3 refills | Status: AC

## 2024-01-30 MED ORDER — ATENOLOL 50 MG PO TABS: 50.0000 mg | ORAL_TABLET | Freq: Every day | ORAL | 3 refills | Status: DC

## 2024-01-30 MED ORDER — TELMISARTAN 40 MG PO TABS: 40.0000 mg | ORAL_TABLET | Freq: Every day | ORAL | 3 refills | Status: DC

## 2024-01-30 MED ORDER — ROSUVASTATIN CALCIUM 5 MG PO TABS
5.0000 mg | ORAL_TABLET | Freq: Every day | ORAL | 3 refills | Status: DC
Start: 2024-01-30 — End: 2024-01-30

## 2024-01-30 MED ORDER — ATENOLOL 50 MG PO TABS: 50.0000 mg | ORAL_TABLET | Freq: Every day | ORAL | 3 refills | Status: AC

## 2024-01-30 MED ORDER — EZETIMIBE 10 MG PO TABS: 10.0000 mg | ORAL_TABLET | Freq: Every day | ORAL | 3 refills | Status: DC

## 2024-01-30 MED ORDER — TELMISARTAN 40 MG PO TABS: 40.0000 mg | ORAL_TABLET | Freq: Every day | ORAL | 3 refills | Status: AC

## 2024-01-30 NOTE — Progress Notes (Signed)
 Subjective:  Patient ID: Micheal Mckinney, male    DOB: September 18, 1970  Age: 53 y.o. MRN: [id number]  CC:  Chief Complaint  Patient presents with   Annual Exam    HPI [name] presents for Annual Exam  PCP, [facility], Dr. Genelle, left shoulder arthroscopy with rotator cuff debridement, biceps tenodesis in January 2024.  Last visit noted from 11/28/2023.  90% improvement, continued to improve with only slight modifications to work.  Follow-up as needed. No other medical providers.  No health changes.  Job is going well.   Hypertension: With history of OSA but unable to tolerate CPAP, improved with side sleeping.  Treated with atenolol , telmisartan  without new med side effects.  Has been seen by cardiology previously, Dr. Mona.  Staying hydrated. Home readings: similar at home.  BP Readings from Last 3 Encounters:  01/30/24 124/70  06/04/23 130/78  11/13/22 128/68   Lab Results  Component Value Date   CREATININE 0.81 06/04/2023   Hyperlipidemia: With elevated coronary calcium  scoring treated with Crestor , Zetia  and target of LDL 70 or lower. No new myalgias.  Lab Results  Component Value Date   CHOL 116 06/04/2023   HDL 29.30 (L) 06/04/2023   LDLCALC 69 06/04/2023   LDLDIRECT 169.6 04/13/2014   TRIG 84.0 06/04/2023   CHOLHDL 4 06/04/2023   Lab Results  Component Value Date   ALT 22 06/04/2023   AST 17 06/04/2023   ALKPHOS 66 06/04/2023   BILITOT 0.8 06/04/2023   Prediabetes: Weight has slightly increased previously but had quit use of chewing tobacco.  Continued plan for weight loss, diet approach. Weight has improved - eating better.  Active with physical work.  Lab Results  Component Value Date   HGBA1C 5.8 06/04/2023   Wt Readings from Last 3 Encounters:  01/30/24 288 lb 3.2 oz (130.7 kg)  06/04/23 294 lb 3.2 oz (133.4 kg)  11/13/22 287 lb 6.4 oz (130.4 kg)            06/04/2023   10:05 AM 11/13/2022    8:47 AM 03/21/2022    9:30 AM  03/13/2022    9:18 AM 11/16/2021    8:50 AM  Depression screen PHQ 2/9  Decreased Interest 0 0 0 0 0  Down, Depressed, Hopeless 0 0 0 0 0  PHQ - 2 Score 0 0 0 0 0  Altered sleeping 0 0 0 0 0  Tired, decreased energy 0 0 0 0 0  Change in appetite 0 0 0 0 0  Feeling bad or failure about yourself  0 0 0 0 0  Trouble concentrating 0 0 0 0 0  Moving slowly or fidgety/restless 0 0 0 0 0  Suicidal thoughts 0 0 0 0 0  PHQ-9 Score 0 0 0 0 0  Difficult doing work/chores     Not difficult at all    Health Maintenance  Topic Date Due   HIV Screening  Never done   Hepatitis C Screening  Never done   Hepatitis B Vaccines (1 of 3 - 19+ 3-dose series) Never done   Colonoscopy  Never done   Zoster Vaccines- Shingrix (1 of 2) Never done   COVID-19 Vaccine (1 - 2024-25 season) Never done   INFLUENZA VACCINE  02/13/2024   DTaP/Tdap/Td (4 - Td or Tdap) 04/07/2028   HPV VACCINES  Aged Out   Meningococcal B Vaccine  Aged Out  Cologuard reordered in November. Sent last week.  Prostate: does not have  family history of prostate cancer The natural history of prostate cancer and ongoing controversy regarding screening and potential treatment outcomes of prostate cancer has been discussed with the patient. The meaning of a false positive PSA and a false negative PSA has been discussed. He indicates understanding of the limitations of this screening test and wishes to proceed with screening PSA testing. Lab Results  Component Value Date   PSA 0.49 11/13/2022   PSA 0.27 01/12/2019    Immunization History  Administered Date(s) Administered   DTaP 07/15/2008   Influenza,inj,Quad PF,6+ Mos 05/07/2013, 04/13/2014, 04/28/2015, 08/12/2016, 10/03/2017, 04/07/2018   Td 01/23/2009   Tdap 04/07/2018  Flu vaccine recommended in fall, along with covid booster.  Defers pneumonia vaccine.   No results found. New glasses 2 weeks ago. No concerns on exam. Possible start of cataracts over past few years.   Dental:  due. Recommended appt.   Alcohol: none  Tobacco: none, no vaping.   Exercise: over 150 min per week - FITT principle discussed. Home walking treadmill.   Declines STI screening   History Patient Active Problem List   Diagnosis Date Noted   Palpitations 01/29/2024   Nontraumatic complete tear of left rotator cuff 07/23/2022   Biceps tendinitis of left upper extremity 07/23/2022   Visit for preventive health examination 10/31/2015   Acute drug-induced gout of right foot 08/15/2015   Hyperlipidemia 05/10/2013   Routine general medical examination at a health care facility 05/07/2013   Carpal tunnel syndrome 01/12/2013   HTN (hypertension) 01/26/2012   OSA (obstructive sleep apnea) 01/26/2012   Obesity 01/26/2012   Past Medical History:  Diagnosis Date   Allergy    hay fever   Foreign body (FB) in soft tissue    right palm   GERD (gastroesophageal reflux disease)    History of chicken pox    Hyperlipidemia    Hypertension    OSA (obstructive sleep apnea) 01/26/2012   does not use CPAP   Past Surgical History:  Procedure Laterality Date   CARPAL TUNNEL RELEASE Bilateral 05/04/2013   CERVICAL FUSION     Alternative to this done in 05/2017, disc replacement   FOREIGN BODY REMOVAL Right 05/18/2018   Procedure: REMOVAL FOREIGN BODY RIGHT PALM;  Surgeon: Murrell Drivers, MD;  Location: [facility];  Service: Orthopedics;  Laterality: Right;   IRRIGATION AND DEBRIDEMENT SEBACEOUS CYST  2009    on back and neck   SHOULDER ARTHROSCOPY WITH ROTATOR CUFF REPAIR AND OPEN BICEPS TENODESIS Left 07/23/2022   Procedure: LEFT SHOULDER ARTHROSCOPY WITH ROTATOR CUFF REPAIR AND BICEPS TENODESIS;  Surgeon: Genelle Standing, MD;  Location: [facility];  Service: Orthopedics;  Laterality: Left;   Allergies  Allergen Reactions   Chlorhexidine  Dermatitis   Prior to Admission medications   Medication Sig Start Date End Date Taking? Authorizing Provider  aspirin  EC 325  MG tablet Take 1 tablet (325 mg total) by mouth daily. Patient taking differently: Take 81 mg by mouth daily. 06/28/22   Genelle Standing, MD  atenolol  (TENORMIN ) 50 MG tablet Take 1 tablet (50 mg total) by mouth daily. 06/04/23   Levora Reyes SAUNDERS, MD  ezetimibe  (ZETIA ) 10 MG tablet Take 1 tablet (10 mg total) by mouth daily. 06/04/23   Levora Reyes SAUNDERS, MD  fluticasone  (FLONASE ) 50 MCG/ACT nasal spray Place 2 sprays into both nostrils daily. 05/09/23   Kennyth Domino, FNP  ranitidine (ZANTAC) 150 MG tablet Take 1 tablet by mouth as needed.    [provider]  rosuvastatin  (CRESTOR ) 5 MG tablet Take 1 tablet (5 mg total) by mouth daily. 06/04/23   Levora Reyes SAUNDERS, MD  telmisartan  (MICARDIS ) 40 MG tablet Take 1 tablet (40 mg total) by mouth daily. 06/04/23   Levora Reyes SAUNDERS, MD   Social History   Socioeconomic History   Marital status: Married    Spouse name: Not on file   Number of children: Not on file   Years of education: Not on file   Highest education level: 12th grade  Occupational History   Not on file  Tobacco Use   Smoking status: Former    Current packs/day: 0.00    Average packs/day: 0.3 packs/day for 25.0 years (6.3 ttl pk-yrs)    Types: Cigarettes    Start date: 01/16/1995    Quit date: 01/16/2020    Years since quitting: 4.0   Smokeless tobacco: Current   Tobacco comments:    Chewing tobacco  Vaping Use   Vaping status: Never Used  Substance and Sexual Activity   Alcohol use: No    Alcohol/week: 0.0 standard drinks of alcohol   Drug use: No   Sexual activity: Yes  Other Topics Concern   Not on file  Social History Narrative   Lives with wife and dogs   Social Drivers of Health   Financial Resource Strain: Low Risk  (07/23/2023)   Overall Financial Resource Strain (CARDIA)    Difficulty of Paying Living Expenses: Not hard at all  Food Insecurity: No Food Insecurity (07/23/2023)   Hunger Vital Sign    Worried About Running Out of Food in the Last  Year: Never true    Ran Out of Food in the Last Year: Never true  Transportation Needs: No Transportation Needs (07/23/2023)   PRAPARE - Administrator, Civil Service (Medical): No    Lack of Transportation (Non-Medical): No  Physical Activity: Not on file  Stress: No Stress Concern Present (07/23/2023)   [employer] of Occupational Health - Occupational Stress Questionnaire    Feeling of Stress : Not at all  Social Connections: Unknown (07/23/2023)   Social Connection and Isolation Panel    Frequency of Communication with Friends and Family: Once a week    Frequency of Social Gatherings with Friends and Family: Not on file    Attends Religious Services: Never    Database administrator or Organizations: No    Attends Engineer, structural: Not on file    Marital Status: Married  Intimate Partner Violence: Unknown (10/17/2021)   Received from [facility]   HITS    Physically Hurt: Not on file    Insult or Talk Down To: Not on file    Threaten Physical Harm: Not on file    Scream or Curse: Not on file    Review of Systems 13 point review of systems per patient health survey noted.  Negative other than as indicated above or in HPI.    Objective:   Vitals:   01/30/24 0955  BP: 124/70  Pulse: 64  Temp: 98.3 F (36.8 C)  TempSrc: Temporal  SpO2: 96%  Weight: 288 lb 3.2 oz (130.7 kg)  Height: 5' 10 (1.778 m)     Physical Exam Vitals reviewed.  Constitutional:      Appearance: He is well-developed.  HENT:     Head: Normocephalic and atraumatic.     Right Ear: External ear normal.     Left Ear: External ear normal.  Eyes:  Conjunctiva/sclera: Conjunctivae normal.     Pupils: Pupils are equal, round, and reactive to light.  Neck:     Thyroid : No thyromegaly.  Cardiovascular:     Rate and Rhythm: Normal rate and regular rhythm.     Heart sounds: Normal heart sounds.  Pulmonary:     Effort: Pulmonary effort is normal. No respiratory distress.      Breath sounds: Normal breath sounds. No wheezing.  Abdominal:     General: There is no distension.     Palpations: Abdomen is soft.     Tenderness: There is no abdominal tenderness.  Musculoskeletal:        General: No tenderness. Normal range of motion.     Cervical back: Normal range of motion and neck supple.     Right lower leg: Edema (trace pedal bilat) present.     Left lower leg: Edema present.  Lymphadenopathy:     Cervical: No cervical adenopathy.  Skin:    General: Skin is warm and dry.  Neurological:     Mental Status: He is alert and oriented to person, place, and time.     Deep Tendon Reflexes: Reflexes are normal and symmetric.  Psychiatric:        Behavior: Behavior normal.     Assessment & Plan:  QUINCE SANTANA is a 53 y.o. male . Annual physical exam  - -anticipatory guidance as below in AVS, screening labs above. Health maintenance items as above in HPI discussed/recommended as applicable.   Essential hypertension - Plan: Comprehensive metabolic panel with GFR, atenolol  (TENORMIN ) 50 MG tablet, telmisartan  (MICARDIS ) 40 MG tablet  - Stable control with current regimen, no new side effects, check monitoring labs, no changes.  Prediabetes - Plan: Hemoglobin A1c  - Commended on weight loss, check A1c, continue to monitor every 6 months.  Continue diet/exercise approach.  Hyperlipidemia, unspecified hyperlipidemia type - Plan: Comprehensive metabolic panel with GFR, Lipid panel, ezetimibe  (ZETIA ) 10 MG tablet, rosuvastatin  (CRESTOR ) 5 MG tablet  - Tolerating current regimen, check updated labs and adjust plan accordingly.  Screening for prostate cancer - Plan: PSA  Screening for deficiency anemia - Plan: CBC  Screening for HIV (human immunodeficiency virus) - Plan: HIV antibody (with reflex)  Need for hepatitis C screening test - Plan: Hepatitis C Antibody   Meds ordered this encounter  Medications   atenolol  (TENORMIN ) 50 MG tablet    Sig: Take  1 tablet (50 mg total) by mouth daily.    Dispense:  90 tablet    Refill:  3   ezetimibe  (ZETIA ) 10 MG tablet    Sig: Take 1 tablet (10 mg total) by mouth daily.    Dispense:  90 tablet    Refill:  3   rosuvastatin  (CRESTOR ) 5 MG tablet    Sig: Take 1 tablet (5 mg total) by mouth daily.    Dispense:  90 tablet    Refill:  3   telmisartan  (MICARDIS ) 40 MG tablet    Sig: Take 1 tablet (40 mg total) by mouth daily.    Dispense:  90 tablet    Refill:  3   Patient Instructions  Thank you for coming in today. No change in medications at this time. If there are any concerns on your bloodwork, I will let you know. Take care!  Preventive Care 61-32 Years Old, Male Preventive care refers to lifestyle choices and visits with your health care provider that can promote health and wellness. Preventive care visits are also  called wellness exams. What can I expect for my preventive care visit? Counseling During your preventive care visit, your health care provider may ask about your: Medical history, including: Past medical problems. Family medical history. Current health, including: Emotional well-being. Home life and relationship well-being. Sexual activity. Lifestyle, including: Alcohol, nicotine or tobacco, and drug use. Access to firearms. Diet, exercise, and sleep habits. Safety issues such as seatbelt and bike helmet use. Sunscreen use. Work and work Astronomer. Physical exam Your health care provider will check your: Height and weight. These may be used to calculate your BMI (body mass index). BMI is a measurement that tells if you are at a healthy weight. Waist circumference. This measures the distance around your waistline. This measurement also tells if you are at a healthy weight and may help predict your risk of certain diseases, such as type 2 diabetes and high blood pressure. Heart rate and blood pressure. Body temperature. Skin for abnormal spots. What immunizations do I  need?  Vaccines are usually given at various ages, according to a schedule. Your health care provider will recommend vaccines for you based on your age, medical history, and lifestyle or other factors, such as travel or where you work. What tests do I need? Screening Your health care provider may recommend screening tests for certain conditions. This may include: Lipid and cholesterol levels. Diabetes screening. This is done by checking your blood sugar (glucose) after you have not eaten for a while (fasting). Hepatitis B test. Hepatitis C test. HIV (human immunodeficiency virus) test. STI (sexually transmitted infection) testing, if you are at risk. Lung cancer screening. Prostate cancer screening. Colorectal cancer screening. Talk with your health care provider about your test results, treatment options, and if necessary, the need for more tests. Follow these instructions at home: Eating and drinking  Eat a diet that includes fresh fruits and vegetables, whole grains, lean protein, and low-fat dairy products. Take vitamin and mineral supplements as recommended by your health care provider. Do not drink alcohol if your health care provider tells you not to drink. If you drink alcohol: Limit how much you have to 0-2 drinks a day. Know how much alcohol is in your drink. In the [place] one drink equals one 12 oz bottle of beer (355 mL), one 5 oz glass of wine (148 mL), or one 1 oz glass of hard liquor (44 mL). Lifestyle Brush your teeth every morning and night with fluoride toothpaste. Floss one time each day. Exercise for at least 30 minutes 5 or more days each week. Do not use any products that contain nicotine or tobacco. These products include cigarettes, chewing tobacco, and vaping devices, such as e-cigarettes. If you need help quitting, ask your health care provider. Do not use drugs. If you are sexually active, practice safe sex. Use a condom or other form of protection to prevent  STIs. Take aspirin  only as told by your health care provider. Make sure that you understand how much to take and what form to take. Work with your health care provider to find out whether it is safe and beneficial for you to take aspirin  daily. Find healthy ways to manage stress, such as: Meditation, yoga, or listening to music. Journaling. Talking to a trusted person. Spending time with friends and family. Minimize exposure to UV radiation to reduce your risk of skin cancer. Safety Always wear your seat belt while driving or riding in a vehicle. Do not drive: If you have been drinking alcohol. Do not  ride with someone who has been drinking. When you are tired or distracted. While texting. If you have been using any mind-altering substances or drugs. Wear a helmet and other protective equipment during sports activities. If you have firearms in your house, make sure you follow all gun safety procedures. What's next? Go to your health care provider once a year for an annual wellness visit. Ask your health care provider how often you should have your eyes and teeth checked. Stay up to date on all vaccines. This information is not intended to replace advice given to you by your health care provider. Make sure you discuss any questions you have with your health care provider. Document Revised: 12/27/2020 Document Reviewed: 12/27/2020 Elsevier Patient Education  2024 Else[employer] Inc.       Signed,   Reyes Pines, MD [facility], [facility] 01/30/24 10:46 AM

## 2024-01-30 NOTE — Patient Instructions (Signed)
 Thank you for coming in today. No change in medications at this time. If there are any concerns on your bloodwork, I will let you know. Take care! Preventive Care 53-53 Years Old, Male Preventive care refers to lifestyle choices and visits with your health care provider that can promote health and wellness. Preventive care visits are also called wellness exams. What can I expect for my preventive care visit? Counseling During your preventive care visit, your health care provider may ask about your: Medical history, including: Past medical problems. Family medical history. Current health, including: Emotional well-being. Home life and relationship well-being. Sexual activity. Lifestyle, including: Alcohol, nicotine or tobacco, and drug use. Access to firearms. Diet, exercise, and sleep habits. Safety issues such as seatbelt and bike helmet use. Sunscreen use. Work and work Astronomer. Physical exam Your health care provider will check your: Height and weight. These may be used to calculate your BMI (body mass index). BMI is a measurement that tells if you are at a healthy weight. Waist circumference. This measures the distance around your waistline. This measurement also tells if you are at a healthy weight and may help predict your risk of certain diseases, such as type 2 diabetes and high blood pressure. Heart rate and blood pressure. Body temperature. Skin for abnormal spots. What immunizations do I need?  Vaccines are usually given at various ages, according to a schedule. Your health care provider will recommend vaccines for you based on your age, medical history, and lifestyle or other factors, such as travel or where you work. What tests do I need? Screening Your health care provider may recommend screening tests for certain conditions. This may include: Lipid and cholesterol levels. Diabetes screening. This is done by checking your blood sugar (glucose) after you have not  eaten for a while (fasting). Hepatitis B test. Hepatitis C test. HIV (human immunodeficiency virus) test. STI (sexually transmitted infection) testing, if you are at risk. Lung cancer screening. Prostate cancer screening. Colorectal cancer screening. Talk with your health care provider about your test results, treatment options, and if necessary, the need for more tests. Follow these instructions at home: Eating and drinking  Eat a diet that includes fresh fruits and vegetables, whole grains, lean protein, and low-fat dairy products. Take vitamin and mineral supplements as recommended by your health care provider. Do not drink alcohol if your health care provider tells you not to drink. If you drink alcohol: Limit how much you have to 0-2 drinks a day. Know how much alcohol is in your drink. In the [place] one drink equals one 12 oz bottle of beer (355 mL), one 5 oz glass of wine (148 mL), or one 1 oz glass of hard liquor (44 mL). Lifestyle Brush your teeth every morning and night with fluoride toothpaste. Floss one time each day. Exercise for at least 30 minutes 5 or more days each week. Do not use any products that contain nicotine or tobacco. These products include cigarettes, chewing tobacco, and vaping devices, such as e-cigarettes. If you need help quitting, ask your health care provider. Do not use drugs. If you are sexually active, practice safe sex. Use a condom or other form of protection to prevent STIs. Take aspirin  only as told by your health care provider. Make sure that you understand how much to take and what form to take. Work with your health care provider to find out whether it is safe and beneficial for you to take aspirin  daily. Find healthy ways to manage  stress, such as: Meditation, yoga, or listening to music. Journaling. Talking to a trusted person. Spending time with friends and family. Minimize exposure to UV radiation to reduce your risk of skin  cancer. Safety Always wear your seat belt while driving or riding in a vehicle. Do not drive: If you have been drinking alcohol. Do not ride with someone who has been drinking. When you are tired or distracted. While texting. If you have been using any mind-altering substances or drugs. Wear a helmet and other protective equipment during sports activities. If you have firearms in your house, make sure you follow all gun safety procedures. What's next? Go to your health care provider once a year for an annual wellness visit. Ask your health care provider how often you should have your eyes and teeth checked. Stay up to date on all vaccines. This information is not intended to replace advice given to you by your health care provider. Make sure you discuss any questions you have with your health care provider. Document Revised: 12/27/2020 Document Reviewed: 12/27/2020 Elsevier Patient Education  2024 [employer].

## 2024-01-30 NOTE — Addendum Note (Signed)
 Addended by: RANDINE HOVE R on: 01/30/2024 11:09 AM   Modules accepted: Orders

## 2024-01-31 ENCOUNTER — Ambulatory Visit: Payer: Self-pay | Admitting: Family Medicine

## 2024-01-31 LAB — HIV ANTIBODY (ROUTINE TESTING W REFLEX): HIV 1&2 Ab, 4th Generation: NONREACTIVE

## 2024-01-31 LAB — HEPATITIS C ANTIBODY: Hepatitis C Ab: NONREACTIVE

## 2024-03-29 ENCOUNTER — Encounter: Payer: Self-pay | Admitting: Family Medicine
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
## 2024-03-29 NOTE — Telephone Encounter (Signed)
 This would be best evaluated in the office so I can perform a neurologic exam, discuss the symptoms further and then place a referral if needed.  Typically specialists would like to see a visit with patient prior to a referral anyway.  Please have him see me in the next week if possible.  Depending on what we talk about that visit I certainly can order some updated neuroimaging to have available at his visit with neurology as well.  Thanks

## 2024-05-17 ENCOUNTER — Encounter: Payer: Self-pay | Admitting: Radiology

## 2024-05-18 ENCOUNTER — Ambulatory Visit: Payer: Self-pay

## 2024-05-18 NOTE — Telephone Encounter (Signed)
  FYI Only or Action Required?: FYI only for provider: appointment scheduled on 05/19/2024.  Patient was last seen in primary care on 01/30/2024 by Levora Reyes SAUNDERS, MD.  Called Nurse Triage reporting Nausea.  Symptoms began a week ago.  Interventions attempted: Other: diet modification.  Symptoms are: gradually worsening.  Triage Disposition: See PCP When Office is Open (Within 3 Days)  Patient/caregiver understands and will follow disposition?: Yes  Copied from CRM [id number]. Topic: Clinical - Red Word Triage >> May 18, 2024  4:36 PM Alfonso HERO wrote: Red Word that prompted transfer to Nurse Triage: every time patient eats he gets sick and having heart palpitations Reason for Disposition  Nausea lasts > 1 week  Answer Assessment - Initial Assessment Questions 1. NAUSEA SEVERITY: How bad is the nausea? (e.g., mild, moderate, severe; dehydration, weight loss)     Patient reports nausea and heart palpitations with meals causing him to not want to eat 2. ONSET: When did the nausea begin?   One week  3. VOMITING: Any vomiting? If Yes, ask: How many times today?     denies 4. RECURRENT SYMPTOM: Have you had nausea before? If Yes, ask: When was the last time? What happened that time?     denies 5. CAUSE: What do you think is causing the nausea?     Unknown  Denies constipation or diarrhea, denies fever, mild mid abdominal pain.   Reports feeling dizzy and light headed  Protocols used: Nausea-A-AH

## 2024-05-19 ENCOUNTER — Ambulatory Visit: Admitting: Family Medicine

## 2024-05-19 ENCOUNTER — Encounter: Payer: Self-pay | Admitting: Family Medicine

## 2024-05-19 VITALS — BP 128/80 | HR 59 | Temp 98.1°F | Resp 14 | Ht 70.0 in | Wt 288.6 lb

## 2024-05-19 DIAGNOSIS — R5381 Other malaise: Secondary | ICD-10-CM | POA: Diagnosis not present

## 2024-05-19 DIAGNOSIS — R1013 Epigastric pain: Secondary | ICD-10-CM

## 2024-05-19 DIAGNOSIS — R002 Palpitations: Secondary | ICD-10-CM

## 2024-05-19 DIAGNOSIS — R42 Dizziness and giddiness: Secondary | ICD-10-CM

## 2024-05-19 DIAGNOSIS — R001 Bradycardia, unspecified: Secondary | ICD-10-CM

## 2024-05-19 DIAGNOSIS — R112 Nausea with vomiting, unspecified: Secondary | ICD-10-CM | POA: Diagnosis not present

## 2024-05-19 LAB — CBC
HCT: 43.7 % (ref 39.0–52.0)
Hemoglobin: 14.9 g/dL (ref 13.0–17.0)
MCHC: 34.1 g/dL (ref 30.0–36.0)
MCV: 84.5 fl (ref 78.0–100.0)
Platelets: 228 K/uL (ref 150.0–400.0)
RBC: 5.16 Mil/uL (ref 4.22–5.81)
RDW: 13.3 % (ref 11.5–15.5)
WBC: 8.7 K/uL (ref 4.0–10.5)

## 2024-05-19 LAB — GLUCOSE, POCT (MANUAL RESULT ENTRY): POC Glucose: 91 mg/dL (ref 70–99)

## 2024-05-19 MED ORDER — OMEPRAZOLE 20 MG PO CPDR: 20.0000 mg | DELAYED_RELEASE_CAPSULE | Freq: Every day | ORAL | 1 refills | Status: AC

## 2024-05-19 NOTE — Progress Notes (Signed)
 Subjective:  Patient ID: Micheal Mckinney, male    DOB: 1970-10-15  Age: 53 y.o. MRN: [id number]  CC:  Chief Complaint  Patient presents with   Nausea    Pt notes has felt off for about 1 week, nauseous when eating, lightheaded consistently the last week as well, denied chest tightness but notes occasions with heart palpitations     HPI [name] presents for   Nausea, lightheadedness, dizziness, palpitations Started about a week to 10 days ago. Feels off, lightheaded. Eating and drinking ok. No change in diet/fluid intake. No new meds/supplements.  No near syncope/syncope.  Few days ago - heart felt off rhythm, not racing. Lasted few times, momentarily, 4-5 episodes in past week. Not in past 2 days.  No chest pain, arm pain/jaw pain.  No dyspnea.  No melena/hematochezia. Normal BM's.  Persistent nausea. Some upper abd pain. Not much appetite. Not much change with food - possible slight worsening. No heartburn.  3 episodes of vomiting - last night and this am, and once last weekend.  No alcohol.  No known fever.   No new HA - HA in past with neck issues.  No focal weakness, slurred speech, no facial droop.   He has a history of hypertension, prediabetes, hyperlipidemia, with A1c of 5.8, LDL 68, normal hemoglobin of 14.6 and normal CMP in July.  History Patient Active Problem List   Diagnosis Date Noted   Palpitations 01/29/2024   Nontraumatic complete tear of left rotator cuff 07/23/2022   Biceps tendinitis of left upper extremity 07/23/2022   Visit for preventive health examination 10/31/2015   Acute drug-induced gout of right foot 08/15/2015   Hyperlipidemia 05/10/2013   Routine general medical examination at a health care facility 05/07/2013   Carpal tunnel syndrome 01/12/2013   HTN (hypertension) 01/26/2012   OSA (obstructive sleep apnea) 01/26/2012   Obesity 01/26/2012   Past Medical History:  Diagnosis Date   Allergy    hay fever   Foreign body  (FB) in soft tissue    right palm   GERD (gastroesophageal reflux disease)    History of chicken pox    Hyperlipidemia    Hypertension    OSA (obstructive sleep apnea) 01/26/2012   does not use CPAP   Past Surgical History:  Procedure Laterality Date   CARPAL TUNNEL RELEASE Bilateral 05/04/2013   CERVICAL FUSION     Alternative to this done in 05/2017, disc replacement   FOREIGN BODY REMOVAL Right 05/18/2018   Procedure: REMOVAL FOREIGN BODY RIGHT PALM;  Surgeon: Murrell Drivers, MD;  Location: [facility];  Service: Orthopedics;  Laterality: Right;   IRRIGATION AND DEBRIDEMENT SEBACEOUS CYST  2009    on back and neck   SHOULDER ARTHROSCOPY WITH ROTATOR CUFF REPAIR AND OPEN BICEPS TENODESIS Left 07/23/2022   Procedure: LEFT SHOULDER ARTHROSCOPY WITH ROTATOR CUFF REPAIR AND BICEPS TENODESIS;  Surgeon: Genelle Standing, MD;  Location:  SURGERY CENTER;  Service: Orthopedics;  Laterality: Left;   Allergies  Allergen Reactions   Chlorhexidine  Dermatitis   Prior to Admission medications   Medication Sig Start Date End Date Taking? Authorizing Provider  aspirin  EC 325 MG tablet Take 1 tablet (325 mg total) by mouth daily. Patient taking differently: Take 81 mg by mouth daily. 06/28/22  Yes Genelle Standing, MD  atenolol  (TENORMIN ) 50 MG tablet Take 1 tablet (50 mg total) by mouth daily. 01/30/24  Yes Levora Reyes SAUNDERS, MD  ezetimibe  (ZETIA ) 10 MG tablet Take 1  tablet (10 mg total) by mouth daily. 01/30/24  Yes Levora Reyes SAUNDERS, MD  fluticasone  (FLONASE ) 50 MCG/ACT nasal spray Place 2 sprays into both nostrils daily. 05/09/23  Yes Kennyth Domino, FNP  ranitidine (ZANTAC) 150 MG tablet Take 1 tablet by mouth as needed.   Yes [provider]  rosuvastatin  (CRESTOR ) 5 MG tablet Take 1 tablet (5 mg total) by mouth daily. 01/30/24  Yes Levora Reyes SAUNDERS, MD  telmisartan  (MICARDIS ) 40 MG tablet Take 1 tablet (40 mg total) by mouth daily. 01/30/24  Yes Levora Reyes SAUNDERS, MD    Social History   Socioeconomic History   Marital status: Married    Spouse name: Not on file   Number of children: Not on file   Years of education: Not on file   Highest education level: 12th grade  Occupational History   Not on file  Tobacco Use   Smoking status: Former    Current packs/day: 0.00    Average packs/day: 0.3 packs/day for 25.0 years (6.3 ttl pk-yrs)    Types: Cigarettes    Start date: 01/16/1995    Quit date: 01/16/2020    Years since quitting: 4.3   Smokeless tobacco: Current   Tobacco comments:    Chewing tobacco  Vaping Use   Vaping status: Never Used  Substance and Sexual Activity   Alcohol use: No    Alcohol/week: 0.0 standard drinks of alcohol   Drug use: No   Sexual activity: Yes  Other Topics Concern   Not on file  Social History Narrative   Lives with wife and dogs   Social Drivers of Health   Financial Resource Strain: Low Risk  (07/23/2023)   Overall Financial Resource Strain (CARDIA)    Difficulty of Paying Living Expenses: Not hard at all  Food Insecurity: No Food Insecurity (07/23/2023)   Hunger Vital Sign    Worried About Running Out of Food in the Last Year: Never true    Ran Out of Food in the Last Year: Never true  Transportation Needs: No Transportation Needs (07/23/2023)   PRAPARE - Administrator, Civil Service (Medical): No    Lack of Transportation (Non-Medical): No  Physical Activity: Not on file  Stress: No Stress Concern Present (07/23/2023)   [employer] of Occupational Health - Occupational Stress Questionnaire    Feeling of Stress : Not at all  Social Connections: Unknown (07/23/2023)   Social Connection and Isolation Panel    Frequency of Communication with Friends and Family: Once a week    Frequency of Social Gatherings with Friends and Family: Not on file    Attends Religious Services: Never    Database Administrator or Organizations: No    Attends Engineer, Structural: Not on file    Marital  Status: Married  Intimate Partner Violence: Unknown (10/17/2021)   Received from [facility]   HITS    Physically Hurt: Not on file    Insult or Talk Down To: Not on file    Threaten Physical Harm: Not on file    Scream or Curse: Not on file    Review of Systems Per HPI  Objective:   Vitals:   05/19/24 1148  BP: 128/80  Pulse: (!) 59  Resp: 14  Temp: 98.1 F (36.7 C)  TempSrc: Temporal  SpO2: 97%  Weight: 288 lb 9.6 oz (130.9 kg)  Height: 5' 10 (1.778 m)    Physical Exam Vitals reviewed.  Constitutional:  General: He is not in acute distress.    Appearance: He is well-developed. He is not ill-appearing, toxic-appearing or diaphoretic.  HENT:     Head: Normocephalic and atraumatic.  Neck:     Vascular: No carotid bruit or JVD.  Cardiovascular:     Rate and Rhythm: Regular rhythm. Bradycardia present.     Heart sounds: Normal heart sounds. No murmur heard.    Comments: Regular rhythm without ectopy. Pulmonary:     Effort: Pulmonary effort is normal.     Breath sounds: Normal breath sounds. No rales.  Abdominal:     General: Abdomen is flat.     Tenderness: There is abdominal tenderness (Slight epigastric tenderness without rebound or guarding.  No other abdominal discomfort.).  Musculoskeletal:     Right lower leg: No edema.     Left lower leg: No edema.  Skin:    General: Skin is warm and dry.  Neurological:     Mental Status: He is alert and oriented to person, place, and time.  Psychiatric:        Mood and Affect: Mood normal.    EKG, sinus bradycardia with rate 48.  Compared to 05/18/2018, rate 56 at that time.  No apparent acute ST or T wave changes or significant changes from November 2019 other than slower rate.  PR 192, QTc 402. Results for orders placed or performed in visit on 05/19/24  POCT glucose (manual entry)   Collection Time: 05/19/24 12:32 PM  Result Value Ref Range   POC Glucose 91 70 - 99 mg/dl     Assessment & Plan:  CAIDENCE HIGASHI is a 53 y.o. male . Lightheadedness - Plan: POCT glucose (manual entry), Comprehensive metabolic panel with GFR, EKG 12-Lead  Malaise  Palpitations - Plan: EKG 12-Lead  Epigastric abdominal pain - Plan: CBC, Lipase, omeprazole (PRILOSEC) 20 MG capsule  Nausea and vomiting, unspecified vomiting type - Plan: POCT glucose (manual entry), Comprehensive metabolic panel with GFR, CBC, Lipase  Bradycardia  1 to 2-week history of malaise, lightheadedness, few episodes of palpitations, epigastric abdominal pain with nausea, few episodes of vomiting.  Slight discomfort at epigastrium on exam but otherwise reassuring exam.  Bradycardic with lower heart rate on EKG than in the past, this potentially could be contributing to his symptoms and he is on beta-blocker with atenolol .  With epigastric symptoms will check CBC to rule out anemia, cover for possible gastritis or PUD with omeprazole for now.  Check lipase to rule out pancreatitis.  Electrolytes, other labs above.  Decrease atenolol  to half dose for now and closely monitor blood pressure, heart rate and monitor for improvement in symptoms with update tomorrow.  ER precautions given.  Meds ordered this encounter  Medications   omeprazole (PRILOSEC) 20 MG capsule    Sig: Take 1 capsule (20 mg total) by mouth daily.    Dispense:  30 capsule    Refill:  1   Patient Instructions  Heart rate is slower today in the 40s.  Otherwise I do not see any concerns on her EKG and blood sugar looks okay.  I am checking other labs including blood counts, electrolytes, and pancreas test and should have those results by tomorrow.  For now, bland foods, make sure you drink plenty of fluids.  Lets decrease your atenolol  to 1/2 pill for now and check your blood pressures at home on that dose.  Give me an update tomorrow with your home readings and how you feel.  For the abdominal discomfort, start omeprazole once per day until I see some of your lab  results.  Return to the clinic or go to the nearest emergency room if any of your symptoms worsen or new symptoms occur.     Signed,   Reyes Pines, MD [facility], [facility] 05/19/24 1:08 PM

## 2024-05-19 NOTE — Patient Instructions (Addendum)
 Heart rate is slower today in the 40s.  Otherwise I do not see any concerns on her EKG and blood sugar looks okay.  I am checking other labs including blood counts, electrolytes, and pancreas test and should have those results by tomorrow.  For now, bland foods, make sure you drink plenty of fluids.  Lets decrease your atenolol  to 1/2 pill for now and check your blood pressures at home on that dose.  Give me an update tomorrow with your home readings and how you feel.  For the abdominal discomfort, start omeprazole once per day until I see some of your lab results.  Return to the clinic or go to the nearest emergency room if any of your symptoms worsen or new symptoms occur.

## 2024-05-20 ENCOUNTER — Ambulatory Visit: Payer: Self-pay | Admitting: Family Medicine

## 2024-05-20 LAB — LIPASE: Lipase: 36 U/L (ref 11.0–59.0)

## 2024-05-20 LAB — COMPREHENSIVE METABOLIC PANEL WITH GFR
ALT: 21 U/L (ref 0–53)
AST: 19 U/L (ref 0–37)
Albumin: 4.5 g/dL (ref 3.5–5.2)
Alkaline Phosphatase: 61 U/L (ref 39–117)
BUN: 12 mg/dL (ref 6–23)
CO2: 27 meq/L (ref 19–32)
Calcium: 10 mg/dL (ref 8.4–10.5)
Chloride: 103 meq/L (ref 96–112)
Creatinine, Ser: 0.85 mg/dL (ref 0.40–1.50)
GFR: 99.15 mL/min (ref >60.00)
Glucose, Bld: 72 mg/dL (ref 70–99)
Potassium: 4 meq/L (ref 3.5–5.1)
Sodium: 139 meq/L (ref 135–145)
Total Bilirubin: 0.9 mg/dL (ref 0.2–1.2)
Total Protein: 7.1 g/dL (ref 6.0–8.3)

## 2024-05-20 NOTE — Progress Notes (Signed)
 Called patient and he still the same. Somewhat better. No know sx. Patient will wait for other results

## 2024-05-26 ENCOUNTER — Encounter (HOSPITAL_BASED_OUTPATIENT_CLINIC_OR_DEPARTMENT_OTHER): Payer: Self-pay | Admitting: Orthopaedic Surgery

## 2024-07-01 ENCOUNTER — Ambulatory Visit (HOSPITAL_BASED_OUTPATIENT_CLINIC_OR_DEPARTMENT_OTHER): Admitting: Orthopaedic Surgery

## 2024-07-23 ENCOUNTER — Other Ambulatory Visit (HOSPITAL_BASED_OUTPATIENT_CLINIC_OR_DEPARTMENT_OTHER): Payer: Self-pay

## 2024-07-23 ENCOUNTER — Ambulatory Visit (HOSPITAL_BASED_OUTPATIENT_CLINIC_OR_DEPARTMENT_OTHER): Admitting: Orthopaedic Surgery

## 2024-07-23 ENCOUNTER — Ambulatory Visit (HOSPITAL_BASED_OUTPATIENT_CLINIC_OR_DEPARTMENT_OTHER): Payer: Self-pay | Admitting: Orthopaedic Surgery

## 2024-07-23 DIAGNOSIS — M7711 Lateral epicondylitis, right elbow: Secondary | ICD-10-CM | POA: Diagnosis not present

## 2024-07-23 MED ORDER — ACETAMINOPHEN 500 MG PO TABS
500.0000 mg | ORAL_TABLET | Freq: Three times a day (TID) | ORAL | 0 refills | Status: AC
  Filled 2024-07-23: qty 30, 10d supply, fill #0

## 2024-07-23 MED ORDER — OXYCODONE HCL 5 MG PO TABS
5.0000 mg | ORAL_TABLET | ORAL | 0 refills | Status: AC | PRN
  Filled 2024-07-23: qty 5, 1d supply, fill #0

## 2024-07-23 MED ORDER — IBUPROFEN 800 MG PO TABS
800.0000 mg | ORAL_TABLET | Freq: Three times a day (TID) | ORAL | 0 refills | Status: AC
  Filled 2024-07-23: qty 30, 10d supply, fill #0

## 2024-07-23 MED ORDER — ASPIRIN 325 MG PO TBEC
325.0000 mg | DELAYED_RELEASE_TABLET | Freq: Every day | ORAL | 0 refills | Status: AC
  Filled 2024-07-23: qty 14, 14d supply, fill #0

## 2024-07-23 NOTE — Progress Notes (Signed)
 "   Chief Complaint: Right elbow pain     History of Present Illness:    Micheal Mckinney is a 54 y.o. male right dominant male who has been having ongoing right elbow pain for the last 3 years since we initially met.  He has been experiencing tennis elbow and pain about the lateral epicondyle.  I have injected him in the lateral condyle with steroid which did give him very good temporary relief.  This has subsequently worn off.  This did give him near complete relief.  He has been working on stretching and exercising as well as the counter strap brace which did give him some okay relief.  This is continuing to bother him.  He does work a very physical job and experiences pain with this    PMH/PSH/Family History/Social History/Meds/Allergies:    Past Medical History:  Diagnosis Date   Allergy    hay fever   Foreign body (FB) in soft tissue    right palm   GERD (gastroesophageal reflux disease)    History of chicken pox    Hyperlipidemia    Hypertension    OSA (obstructive sleep apnea) 01/26/2012   does not use CPAP   Past Surgical History:  Procedure Laterality Date   CARPAL TUNNEL RELEASE Bilateral 05/04/2013   CERVICAL FUSION     Alternative to this done in 05/2017, disc replacement   FOREIGN BODY REMOVAL Right 05/18/2018   Procedure: REMOVAL FOREIGN BODY RIGHT PALM;  Surgeon: Murrell Drivers, MD;  Location: [facility];  Service: Orthopedics;  Laterality: Right;   IRRIGATION AND DEBRIDEMENT SEBACEOUS CYST  2009    on back and neck   SHOULDER ARTHROSCOPY WITH ROTATOR CUFF REPAIR AND OPEN BICEPS TENODESIS Left 07/23/2022   Procedure: LEFT SHOULDER ARTHROSCOPY WITH ROTATOR CUFF REPAIR AND BICEPS TENODESIS;  Surgeon: Genelle Standing, MD;  Location: [facility];  Service: Orthopedics;  Laterality: Left;   Social History   Socioeconomic History   Marital status: Married    Spouse name: Not on file   Number of children: Not on file   Years of  education: Not on file   Highest education level: 12th grade  Occupational History   Not on file  Tobacco Use   Smoking status: Former    Current packs/day: 0.00    Average packs/day: 0.3 packs/day for 25.0 years (6.3 ttl pk-yrs)    Types: Cigarettes    Start date: 01/16/1995    Quit date: 01/16/2020    Years since quitting: 4.5   Smokeless tobacco: Current   Tobacco comments:    Chewing tobacco  Vaping Use   Vaping status: Never Used  Substance and Sexual Activity   Alcohol use: No    Alcohol/week: 0.0 standard drinks of alcohol   Drug use: No   Sexual activity: Yes  Other Topics Concern   Not on file  Social History Narrative   Lives with wife and dogs   Social Drivers of Health   Tobacco Use: High Risk (05/19/2024)   Patient History    Smoking Tobacco Use: Former    Smokeless Tobacco Use: Current    Passive Exposure: Not on Actuary Strain: Low Risk (07/23/2023)   Overall Financial Resource Strain (CARDIA)    Difficulty of Paying Living Expenses: Not hard at all  Food Insecurity: No Food Insecurity (07/23/2023)   Hunger Vital Sign    Worried About Running Out of Food in the Last Year: Never true  Ran Out of Food in the Last Year: Never true  Transportation Needs: No Transportation Needs (07/23/2023)   PRAPARE - Administrator, Civil Service (Medical): No    Lack of Transportation (Non-Medical): No  Physical Activity: Not on file  Stress: No Stress Concern Present (07/23/2023)   [employer] of Occupational Health - Occupational Stress Questionnaire    Feeling of Stress : Not at all  Social Connections: Unknown (07/23/2023)   Social Connection and Isolation Panel    Frequency of Communication with Friends and Family: Once a week    Frequency of Social Gatherings with Friends and Family: Not on file    Attends Religious Services: Never    Active Member of Clubs or Organizations: No    Attends Engineer, Structural: Not on file     Marital Status: Married  Depression (PHQ2-9): Low Risk (06/04/2023)   Depression (PHQ2-9)    PHQ-2 Score: 0  Alcohol Screen: Not on file  Housing: Low Risk (07/23/2023)   Housing Stability Vital Sign    Unable to Pay for Housing in the Last Year: No    Number of Times Moved in the Last Year: 0    Homeless in the Last Year: No  Utilities: Not on file  Health Literacy: Not on file   Family History  Problem Relation Age of Onset   Cancer Mother        breast   Stroke Father    Allergies[1] Current Outpatient Medications  Medication Sig Dispense Refill   acetaminophen  (TYLENOL ) 500 MG tablet Take 1 tablet (500 mg total) by mouth every 8 (eight) hours for 10 days. 30 tablet 0   aspirin  EC 325 MG tablet Take 1 tablet (325 mg total) by mouth daily. 14 tablet 0   ibuprofen  (ADVIL ) 800 MG tablet Take 1 tablet (800 mg total) by mouth every 8 (eight) hours for 10 days. Please take with food, please alternate with acetaminophen  30 tablet 0   oxyCODONE  (ROXICODONE ) 5 MG immediate release tablet Take 1 tablet (5 mg total) by mouth every 4 (four) hours as needed for severe pain (pain score 7-10) or breakthrough pain. 5 tablet 0   aspirin  EC 325 MG tablet Take 1 tablet (325 mg total) by mouth daily. (Patient taking differently: Take 81 mg by mouth daily.) 30 tablet 0   atenolol  (TENORMIN ) 50 MG tablet Take 1 tablet (50 mg total) by mouth daily. 90 tablet 3   ezetimibe  (ZETIA ) 10 MG tablet Take 1 tablet (10 mg total) by mouth daily. 90 tablet 3   fluticasone  (FLONASE ) 50 MCG/ACT nasal spray Place 2 sprays into both nostrils daily. 16 g 6   omeprazole  (PRILOSEC) 20 MG capsule Take 1 capsule (20 mg total) by mouth daily. 30 capsule 1   ranitidine (ZANTAC) 150 MG tablet Take 1 tablet by mouth as needed.     rosuvastatin  (CRESTOR ) 5 MG tablet Take 1 tablet (5 mg total) by mouth daily. 90 tablet 3   telmisartan  (MICARDIS ) 40 MG tablet Take 1 tablet (40 mg total) by mouth daily. 90 tablet 3   No current  facility-administered medications for this visit.   No results found.  Review of Systems:   A ROS was performed including pertinent positives and negatives as documented in the HPI.  Physical Exam :   Constitutional: NAD and appears stated age Neurological: Alert and oriented Psych: Appropriate affect and cooperative There were no vitals taken for this visit.   Comprehensive Musculoskeletal Exam:  Right elbow with lateral epicondylar pain and tenderness.  Positive pain with resisted extension of the hand.  Otherwise normal forearm and hand exam   Imaging:   Xray (3 views right elbow): Normal    I personally reviewed and interpreted the radiographs.   Assessment and Plan:   54 y.o. male with right elbow lateral epicondylitis which has been recalcitrant to now an injection as well as stretching anti-inflammatories as well as a counterforce strap.  Given that he has failed all of these modalities we did discuss additional intervention including right elbow arthroscopy with ECRB debridement.  I discussed risk limitation as well as associated recovery.  After discussion elected proceed  -Plan for right elbow arthroscopy with extensor carpi radialis brevis debridement   After a lengthy discussion of treatment options, including risks, benefits, alternatives, complications of surgical and nonsurgical conservative options, the patient elected surgical repair.   The patient  is aware of the material risks  and complications including, but not limited to injury to adjacent structures, neurovascular injury, infection, numbness, bleeding, implant failure, thermal burns, stiffness, persistent pain, failure to heal, disease transmission from allograft, need for further surgery, dislocation, anesthetic risks, blood clots, risks of death,and others. The probabilities of surgical success and failure discussed with patient given their particular co-morbidities.The time and nature of expected  rehabilitation and recovery was discussed.The patient's questions were all answered preoperatively.  No barriers to understanding were noted. I explained the natural history of the disease process and Rx rationale.  I explained to the patient what I considered to be reasonable expectations given their personal situation.  The final treatment plan was arrived at through a shared patient decision making process model.    I personally saw and evaluated the patient, and participated in the management and treatment plan.  Elspeth Parker, MD Attending Physician, Orthopedic Surgery  This document was dictated using Dragon voice recognition software. A reasonable attempt at proof reading has been made to minimize errors.    [1]  Allergies Allergen Reactions   Chlorhexidine  Dermatitis   "

## 2024-08-10 ENCOUNTER — Encounter (HOSPITAL_BASED_OUTPATIENT_CLINIC_OR_DEPARTMENT_OTHER): Payer: Self-pay | Admitting: Orthopaedic Surgery

## 2024-08-10 ENCOUNTER — Other Ambulatory Visit: Payer: Self-pay

## 2024-08-11 ENCOUNTER — Ambulatory Visit (HOSPITAL_BASED_OUTPATIENT_CLINIC_OR_DEPARTMENT_OTHER): Admitting: Orthopaedic Surgery

## 2024-08-16 ENCOUNTER — Other Ambulatory Visit (HOSPITAL_BASED_OUTPATIENT_CLINIC_OR_DEPARTMENT_OTHER): Payer: Self-pay | Admitting: Orthopaedic Surgery

## 2024-08-16 DIAGNOSIS — M7711 Lateral epicondylitis, right elbow: Secondary | ICD-10-CM

## 2024-08-17 ENCOUNTER — Ambulatory Visit (HOSPITAL_BASED_OUTPATIENT_CLINIC_OR_DEPARTMENT_OTHER)
Admission: RE | Admit: 2024-08-17 | Discharge: 2024-08-17 | Disposition: A | Discharge status: Home / Self Care | Attending: Orthopaedic Surgery | Admitting: Orthopaedic Surgery

## 2024-08-17 ENCOUNTER — Encounter (HOSPITAL_BASED_OUTPATIENT_CLINIC_OR_DEPARTMENT_OTHER)
Admission: RE | Disposition: A | Discharge status: Home / Self Care | Payer: Self-pay | Source: Home / Self Care | Attending: Orthopaedic Surgery

## 2024-08-17 ENCOUNTER — Encounter (HOSPITAL_BASED_OUTPATIENT_CLINIC_OR_DEPARTMENT_OTHER): Admitting: Anesthesiology

## 2024-08-17 ENCOUNTER — Encounter (HOSPITAL_BASED_OUTPATIENT_CLINIC_OR_DEPARTMENT_OTHER): Payer: Self-pay | Admitting: Orthopaedic Surgery

## 2024-08-17 ENCOUNTER — Other Ambulatory Visit: Payer: Self-pay

## 2024-08-17 DIAGNOSIS — Z6841 Body Mass Index (BMI) 40.0 and over, adult: Secondary | ICD-10-CM | POA: Insufficient documentation

## 2024-08-17 DIAGNOSIS — Z87891 Personal history of nicotine dependence: Secondary | ICD-10-CM | POA: Insufficient documentation

## 2024-08-17 DIAGNOSIS — Z01818 Encounter for other preprocedural examination: Secondary | ICD-10-CM

## 2024-08-17 DIAGNOSIS — K219 Gastro-esophageal reflux disease without esophagitis: Secondary | ICD-10-CM | POA: Insufficient documentation

## 2024-08-17 DIAGNOSIS — Z79899 Other long term (current) drug therapy: Secondary | ICD-10-CM | POA: Insufficient documentation

## 2024-08-17 DIAGNOSIS — G4733 Obstructive sleep apnea (adult) (pediatric): Secondary | ICD-10-CM | POA: Insufficient documentation

## 2024-08-17 DIAGNOSIS — I10 Essential (primary) hypertension: Secondary | ICD-10-CM | POA: Insufficient documentation

## 2024-08-17 DIAGNOSIS — E66813 Obesity, class 3: Secondary | ICD-10-CM | POA: Insufficient documentation

## 2024-08-17 DIAGNOSIS — E785 Hyperlipidemia, unspecified: Secondary | ICD-10-CM | POA: Insufficient documentation

## 2024-08-17 DIAGNOSIS — M7711 Lateral epicondylitis, right elbow: Secondary | ICD-10-CM | POA: Diagnosis not present

## 2024-08-17 DIAGNOSIS — M1990 Unspecified osteoarthritis, unspecified site: Secondary | ICD-10-CM | POA: Insufficient documentation

## 2024-08-17 MED ORDER — TRANEXAMIC ACID-NACL 1000-0.7 MG/100ML-% IV SOLN
1000.0000 mg | INTRAVENOUS | Status: AC
  Administered 2024-08-17: 1000 mg via INTRAVENOUS

## 2024-08-17 MED ORDER — FENTANYL CITRATE (PF) 100 MCG/2ML IJ SOLN: 25.0000 ug | INTRAMUSCULAR | Status: DC | PRN

## 2024-08-17 MED ORDER — SUCCINYLCHOLINE CHLORIDE 200 MG/10ML IV SOSY
PREFILLED_SYRINGE | INTRAVENOUS | Status: DC | PRN
  Administered 2024-08-17: 160 mg via INTRAVENOUS

## 2024-08-17 MED ORDER — GABAPENTIN 300 MG PO CAPS
300.0000 mg | ORAL_CAPSULE | Freq: Once | ORAL | Status: AC
  Administered 2024-08-17: 300 mg via ORAL

## 2024-08-17 MED ORDER — GABAPENTIN 300 MG PO CAPS
ORAL_CAPSULE | ORAL | Status: AC
  Filled 2024-08-17: qty 1

## 2024-08-17 MED ORDER — LIDOCAINE 2% (20 MG/ML) 5 ML SYRINGE
INTRAMUSCULAR | Status: DC | PRN
  Administered 2024-08-17: 100 mg via INTRAVENOUS

## 2024-08-17 MED ORDER — EPHEDRINE 5 MG/ML INJ
INTRAVENOUS | Status: AC
  Filled 2024-08-17: qty 10

## 2024-08-17 MED ORDER — FENTANYL CITRATE (PF) 100 MCG/2ML IJ SOLN
INTRAMUSCULAR | Status: AC
  Filled 2024-08-17: qty 2

## 2024-08-17 MED ORDER — ONDANSETRON HCL 4 MG/2ML IJ SOLN
INTRAMUSCULAR | Status: AC
  Filled 2024-08-17: qty 2

## 2024-08-17 MED ORDER — CEFAZOLIN SODIUM-DEXTROSE 3-4 GM/150ML-% IV SOLN
INTRAVENOUS | Status: AC
  Filled 2024-08-17: qty 150

## 2024-08-17 MED ORDER — AMISULPRIDE (ANTIEMETIC) 5 MG/2ML IV SOLN: 10.0000 mg | Freq: Once | INTRAVENOUS | Status: DC | PRN

## 2024-08-17 MED ORDER — DEXAMETHASONE SOD PHOSPHATE PF 10 MG/ML IJ SOLN
INTRAMUSCULAR | Status: AC
  Filled 2024-08-17: qty 1

## 2024-08-17 MED ORDER — DEXAMETHASONE SOD PHOSPHATE PF 10 MG/ML IJ SOLN
INTRAMUSCULAR | Status: DC | PRN
  Administered 2024-08-17: 8 mg via INTRAVENOUS

## 2024-08-17 MED ORDER — MIDAZOLAM HCL 2 MG/2ML IJ SOLN
INTRAMUSCULAR | Status: AC
  Filled 2024-08-17: qty 2

## 2024-08-17 MED ORDER — LACTATED RINGERS IV SOLN: INTRAVENOUS | Status: DC

## 2024-08-17 MED ORDER — OXYCODONE HCL 5 MG/5ML PO SOLN: 5.0000 mg | Freq: Once | ORAL | Status: AC | PRN

## 2024-08-17 MED ORDER — ACETAMINOPHEN 500 MG PO TABS
ORAL_TABLET | ORAL | Status: AC
  Filled 2024-08-17: qty 2

## 2024-08-17 MED ORDER — SODIUM CHLORIDE 0.9 % IR SOLN
Status: DC | PRN
  Administered 2024-08-17: 1000 mL

## 2024-08-17 MED ORDER — ONDANSETRON HCL 4 MG/2ML IJ SOLN
INTRAMUSCULAR | Status: DC | PRN
  Administered 2024-08-17: 4 mg via INTRAVENOUS

## 2024-08-17 MED ORDER — FENTANYL CITRATE (PF) 100 MCG/2ML IJ SOLN
INTRAMUSCULAR | Status: DC | PRN
  Administered 2024-08-17: 100 ug via INTRAVENOUS
  Administered 2024-08-17: 50 ug via INTRAVENOUS

## 2024-08-17 MED ORDER — CEFAZOLIN SODIUM-DEXTROSE 3-4 GM/150ML-% IV SOLN
3.0000 g | INTRAVENOUS | Status: AC
  Administered 2024-08-17: 2 g via INTRAVENOUS

## 2024-08-17 MED ORDER — PROPOFOL 10 MG/ML IV BOLUS
INTRAVENOUS | Status: DC | PRN
  Administered 2024-08-17: 200 mg via INTRAVENOUS

## 2024-08-17 MED ORDER — TRANEXAMIC ACID-NACL 1000-0.7 MG/100ML-% IV SOLN
INTRAVENOUS | Status: AC
  Filled 2024-08-17: qty 100

## 2024-08-17 MED ORDER — MIDAZOLAM HCL (PF) 2 MG/2ML IJ SOLN
INTRAMUSCULAR | Status: DC | PRN
  Administered 2024-08-17: 2 mg via INTRAVENOUS

## 2024-08-17 MED ORDER — OXYCODONE HCL 5 MG PO TABS
5.0000 mg | ORAL_TABLET | Freq: Once | ORAL | Status: AC | PRN
  Administered 2024-08-17: 5 mg via ORAL

## 2024-08-17 MED ORDER — ACETAMINOPHEN 500 MG PO TABS
1000.0000 mg | ORAL_TABLET | Freq: Once | ORAL | Status: AC
  Administered 2024-08-17: 1000 mg via ORAL

## 2024-08-17 MED ORDER — EPHEDRINE SULFATE (PRESSORS) 25 MG/5ML IV SOSY
PREFILLED_SYRINGE | INTRAVENOUS | Status: DC | PRN
  Administered 2024-08-17: 10 mg via INTRAVENOUS

## 2024-08-17 MED ORDER — BUPIVACAINE HCL (PF) 0.25 % IJ SOLN
INTRAMUSCULAR | Status: DC | PRN
  Administered 2024-08-17: 15 mL

## 2024-08-17 MED ORDER — OXYCODONE HCL 5 MG PO TABS
ORAL_TABLET | ORAL | Status: AC
  Filled 2024-08-17: qty 1

## 2024-08-17 NOTE — Brief Op Note (Deleted)
" ° °  Brief Op Note  Date of Surgery: 08/17/2024  Preoperative Diagnosis: RIGHT TENNIS ELBOW  Postoperative Diagnosis: same  Procedure: Procedures: ARTHROSCOPY, ELBOW WITH DEBRIDEMENT  Implants: * No implants in log *  Surgeons: Surgeon(s): Genelle Standing, MD  Anesthesia: General    Estimated Blood Loss: See anesthesia record  Complications: None  Condition to PACU: Stable  Standing LITTIE Genelle, MD 08/17/2024 11:16 AM'  "

## 2024-08-17 NOTE — Brief Op Note (Signed)
" ° °  Brief Op Note  Date of Surgery: 08/17/2024  Preoperative Diagnosis: RIGHT TENNIS ELBOW  Postoperative Diagnosis: same  Procedure: Procedures: ARTHROSCOPY, ELBOW WITH DEBRIDEMENT  Implants: * No implants in log *  Surgeons: Surgeon(s): Genelle Standing, MD  Anesthesia: General    Estimated Blood Loss: See anesthesia record  Complications: None  Condition to PACU: Stable  Standing LITTIE Genelle, MD 08/17/2024 12:41 PM  "

## 2024-08-17 NOTE — Anesthesia Postprocedure Evaluation (Signed)
"   Anesthesia Post Note  Patient: Micheal Mckinney  Procedure(s) Performed: ARTHROSCOPY, ELBOW WITH DEBRIDEMENT (Right)     Patient location during evaluation: PACU Anesthesia Type: General Level of consciousness: awake Pain management: pain level controlled Vital Signs Assessment: post-procedure vital signs reviewed and stable Respiratory status: spontaneous breathing, nonlabored ventilation and respiratory function stable Cardiovascular status: blood pressure returned to baseline and stable Postop Assessment: no apparent nausea or vomiting Anesthetic complications: no   No notable events documented.  Last Vitals:  Vitals:   08/17/24 1300 08/17/24 1314  BP: (!) 121/55 95/62  Pulse: 62 64  Resp: 14 16  Temp:  (!) 36.3 C  SpO2: 95%     Last Pain:  Vitals:   08/17/24 1319  TempSrc:   PainSc: 3                  [name]      "

## 2024-08-17 NOTE — Op Note (Addendum)
" ° °  Date of Surgery: 08/17/2024  INDICATIONS: Mr. Micheal Mckinney is a 54 y.o.-year-old male with right elbow recalcitrant tennis elbow.  The risk and benefits of the procedure were discussed in detail and documented in the pre-operative evaluation.   PREOPERATIVE DIAGNOSIS: 1. Right elbow tennis elbow  POSTOPERATIVE DIAGNOSIS: Same.  PROCEDURE: 1.  Right elbow arthroscopy with extensor carpi radial brevis release  SURGEON: Elspeth LITTIE Parker MD  ASSISTANT: Conley Dawson, ATC  ANESTHESIA:  general  IV FLUIDS AND URINE: See anesthesia record.  ANTIBIOTICS: Ancef   ESTIMATED BLOOD LOSS:  5mL.  IMPLANTS:  * No implants in log *  DRAINS: None  CULTURES: None  COMPLICATIONS: none  DESCRIPTION OF PROCEDURE:   I identified the patient in the pre-operative holding area.  I marked the operative elbow with my initials. I reviewed the risks and benefits of the proposed surgical intervention and the patient wished to proceed. Patient was subsequently taken back to the operating room.  The patient was transferred to the operative suite and placed in the lateral position with all bony prominences padded.     SCDs were placed on the lower extremities. Appropriate antibiotics was administered within 1 hour before incision. The operative extremity was then prepped and draped in standard fashion. A time out was performed confirming the correct extremity, correct patient and correct procedure.    At this time the elbow was insufflated with 20 cc of normal saline in the soft spot.  I began with a medial incision in the elbow with care to protect the ulnar nerve.  The trocar was introduced and a pop was felt in the joint.  At this time a lateral portal was created under direct visualization.  Dannielle was introduced and the entirety of the ECRB tendon was debrided back to healthy muscle with care not to violate the muscle.  There was also plica tissue around the radial head as well as anterior capsular thickening  that was debrided as well back to healthy margin.  That concluded the case.  Skin was closed with 3-0 nylon. Xeroform gauze, gauze, Tegaderm, Iceman and brace were applied.  Instrument, sponge, and needle counts were correct prior to wound closure and at the conclusion of the case.  The patient was taken to the PACU without complication     POSTOPERATIVE PLAN: The patient will be range of motion as tolerated on the operative side.  They will be placed on aspirin  for blood work prevention.  I will see them back in 2 weeks but we will not necessarily require formal physical therapy  Elspeth LITTIE Parker, MD 12:48 PM    "

## 2024-08-17 NOTE — Transfer of Care (Signed)
 Immediate Anesthesia Transfer of Care Note  Patient: Micheal Mckinney  Procedure(s) Performed: ARTHROSCOPY, ELBOW WITH DEBRIDEMENT (Right)  Patient Location: PACU  Anesthesia Type:General  Level of Consciousness: awake, alert , and oriented  Airway & Oxygen Therapy: Patient Spontanous Breathing and Patient connected to face mask oxygen  Post-op Assessment: Report given to RN, Post -op Vital signs reviewed and stable, and Patient moving all extremities X 4  Post vital signs: Reviewed and stable  Last Vitals:  Vitals Value Taken Time  BP 128/72   Temp    Pulse 67 08/17/24 12:46  Resp 12   SpO2 94 % 08/17/24 12:46  Vitals shown include unfiled device data.  Last Pain:  Vitals:   08/17/24 0913  TempSrc: Temporal  PainSc: 2       Patients Stated Pain Goal: 5 (08/17/24 0913)  Complications: No notable events documented.

## 2024-08-18 ENCOUNTER — Encounter (HOSPITAL_BASED_OUTPATIENT_CLINIC_OR_DEPARTMENT_OTHER): Payer: Self-pay | Admitting: Orthopaedic Surgery

## 2024-08-19 ENCOUNTER — Ambulatory Visit: Admitting: Rehabilitative and Restorative Service Providers"

## 2024-08-19 ENCOUNTER — Other Ambulatory Visit: Payer: Self-pay

## 2024-08-19 ENCOUNTER — Encounter: Payer: Self-pay | Admitting: Physical Therapy

## 2024-08-19 DIAGNOSIS — M25521 Pain in right elbow: Secondary | ICD-10-CM

## 2024-08-19 NOTE — Therapy (Signed)
 " OUTPATIENT PHYSICAL THERAPY EVALUATION   Patient Name: Micheal Mckinney MRN: [id number] DOB:1971/02/18, 54 y.o., male Today's Date: 08/19/2024  END OF SESSION:  PT End of Session - 08/19/24 1005     Visit Number 1    Number of Visits 3    Date for Recertification  09/16/24    Authorization Type [employer]    Authorization Time Period 60VL no auth per appt notes    PT Start Time 1010    PT Stop Time 1041    PT Time Calculation (min) 31 min          Past Medical History:  Diagnosis Date   Allergy    hay fever   Foreign body (FB) in soft tissue    right palm   GERD (gastroesophageal reflux disease)    History of chicken pox    Hyperlipidemia    Hypertension    OSA (obstructive sleep apnea) 01/26/2012   does not use CPAP   Past Surgical History:  Procedure Laterality Date   ARTHROSCOPY, ELBOW WITH DEBRIDEMENT Right 08/17/2024   Procedure: ARTHROSCOPY, ELBOW WITH DEBRIDEMENT;  Surgeon: Genelle Standing, MD;  Location: [facility];  Service: Orthopedics;  Laterality: Right;  RIGHT ELBOW ARTHROSCOPY WITH extensor carpi radialis brevis DEBRIDEMENT   CARPAL TUNNEL RELEASE Bilateral 05/04/2013   CERVICAL FUSION     Alternative to this done in 05/2017, disc replacement   FOREIGN BODY REMOVAL Right 05/18/2018   Procedure: REMOVAL FOREIGN BODY RIGHT PALM;  Surgeon: Murrell Drivers, MD;  Location: [facility];  Service: Orthopedics;  Laterality: Right;   IRRIGATION AND DEBRIDEMENT SEBACEOUS CYST  2009    on back and neck   SHOULDER ARTHROSCOPY WITH ROTATOR CUFF REPAIR AND OPEN BICEPS TENODESIS Left 07/23/2022   Procedure: LEFT SHOULDER ARTHROSCOPY WITH ROTATOR CUFF REPAIR AND BICEPS TENODESIS;  Surgeon: Genelle Standing, MD;  Location: [facility];  Service: Orthopedics;  Laterality: Left;   Patient Active Problem List   Diagnosis Date Noted   Lateral epicondylitis, right elbow 08/17/2024   Palpitations 01/29/2024   Nontraumatic complete tear of left  rotator cuff 07/23/2022   Biceps tendinitis of left upper extremity 07/23/2022   Visit for preventive health examination 10/31/2015   Acute drug-induced gout of right foot 08/15/2015   Hyperlipidemia 05/10/2013   Routine general medical examination at a health care facility 05/07/2013   Carpal tunnel syndrome 01/12/2013   HTN (hypertension) 01/26/2012   OSA (obstructive sleep apnea) 01/26/2012   Obesity 01/26/2012    PCP: Levora Reyes SAUNDERS, MD   REFERRING PROVIDER: Genelle Standing, MD   REFERRING DIAG: M77.11 (ICD-10-CM) - Lateral epicondylitis, right elbow   THERAPY DIAG:  Pain in right elbow  Rationale for Evaluation and Treatment: Rehabilitation  ONSET DATE: 08/17/24 R elbow scope/release  SUBJECTIVE:  SUBJECTIVE STATEMENT: Pt reports consistent elbow pain prior to surgery, does manual/dexterous work. States since surgery he is doing quite well. States he felt a bit under the weather yesterday (fatigue, cough) but resolved today, no red flag symptoms (fevers/chills, nausea, chest pain, SOB, etc). Some transient numbness/weakness day of surgery but states this resolved by yesterday. States he has been able to do his normal ADLs without limitation, has not yet returned to work. States he feels better than before surgery. States dressing fell off and he replaced with bandaids.  Hand dominance: Right but can use either arm  PERTINENT HISTORY: GERD, HTN, OSA  PAIN:  Are you having pain? Maybe a half a point out of 10, no more than 4-5/10 since surgery Agg: full ROM, rotating arm Ease: getting out of aggravating position  PRECAUTIONS: per secure chat communication w/ Dr. Genelle 08/19/24, no restrictions, recommend discharge dressing on eval and place bandaids over incisions  RED FLAGS: None   WEIGHT  BEARING RESTRICTIONS: No  FALLS:  Has patient fallen in last 6 months? 1 fall this past weekend while moving snow, denies balance issues  LIVING ENVIRONMENT: 2 story home with wife  OCCUPATION: Works as a biochemist, clinical for [employer] - currently out of work for surgery, unsure when will be cleared to return to work   PLOF: Independent  PATIENT GOALS: get back to work, merchant [employer] officer, using wrenches/hammers  NEXT MD VISIT:  08/25/24  OBJECTIVE:  Note: Objective measures were completed at Evaluation unless otherwise noted.  DIAGNOSTIC FINDINGS:  Post op R elbow arthroscopy w/ ECRB release  PATIENT SURVEYS:  Deferred  COGNITION: Overall cognitive status: Within functional limits for tasks assessed     SENSATION: [facility]  EDEMA/INSPECTION:  Incisions well appearing without drainage or erythema, medial and lateral both covered with clean bandaid. No bruising apparent. No significant edema present.    UPPER EXTREMITY ROM:  Active ROM Right eval Left eval  Shoulder flexion 178 deg   Shoulder abduction    Elbow flexion 138 deg   Elbow extension Lacking 10-12 mild transient pain    Wrist pronation 85 deg    Wrist supination 62 deg s   Wrist flexion    Wrist extension       (Blank rows = not tested)   (Key: WFL = within functional limits not formally assessed, * = concordant pain, s = stiffness/stretching sensation, NT = not tested)  Comments:    UPPER EXTREMITY MMT:  MMT Right eval Left eval  Shoulder flexion    Shoulder abduction    Shoulder internal rotation    Shoulder external rotation    Elbow flexion    Elbow extension    Wrist flexion    Wrist extension    Wrist pronation    Wrist supination    Grip strength     (Blank rows = not tested)  (Key: WFL = within functional limits not formally assessed, * = concordant pain, s = stiffness/stretching sensation, NT = not tested) Comment: full MMT deferred given acuity of surgery, although he is able to  withstand gentle resistance without pain or apparent weakness for elbow flex/ext, wrist flex/ext  TREATMENT:  [facility] Adult PT Treatment:                                                DATE: 08/19/24 Therapeutic Exercise: HEP practice + education/handout  Self Care: Education/discussion re: monitoring for post op complications and appropriate action should they occur, comfortable movement and activity modification as indicated, post op rehab per physician communication, gradual progression of activities based on symptom response and regression as indicated   PATIENT EDUCATION: Education details: Pt education on PT impairments, prognosis, and POC. Informed consent. Rationale for interventions, safe/appropriate HEP performance, self care as above Person educated: Patient Education method: Explanation, Demonstration, Tactile cues, Verbal cues Education comprehension: verbalized understanding, returned demonstration, verbal cues required, tactile cues required, and needs further education    HOME EXERCISE PROGRAM: Access Code: [email or web address] URL: [email or web address] Date: 08/19/2024 Prepared by: Alm Jenny  Exercises - Standing Shoulder Flexion Full Range  - 2-3 x daily - 1 sets - 10 reps - Seated Scapular Retraction  - 2-3 x daily - 1 sets - Seated Elbow Flexion and Extension AROM  - 2-3 x daily - 1 sets - 10 reps - Seated Forearm Pronation and Supination AROM  - 2-3 x daily - 1 sets - 10 reps - Wrist AROM Flexion Extension  - 2-3 x daily - 1 sets - 10 reps  ASSESSMENT:  CLINICAL IMPRESSION: Patient is a 54 y.o. gentleman who was seen today for physical therapy evaluation and treatment for R elbow arthroscopy w/ ECRB release 08/17/24. Per communication w/ surgical team, plan for dressing change (see above), no formal restrictions. Pt arrives w/o concerning  post op features, overall states he is doing quite well and has already changed his own dressing (bandaids over incisions as directed by physician). Mild ROM limitations apparent but overall pt doing quite well post op. Introductory HEP is issued, tolerates well, self care education as above. No adverse events, no increase in resting pain. Reinforced surgical follow up next week, per communication with surgical team and pt, do not anticipate significant therapy needs at this time although did educate pt on indications to schedule additional visits. He verbalizes agreement/understanding at this time. Pt departs today's session in no acute distress, all voiced questions/concerns addressed appropriately from PT perspective.    OBJECTIVE IMPAIRMENTS: decreased mobility, decreased ROM, decreased strength, impaired UE functional use, and pain.   ACTIVITY LIMITATIONS: carrying and lifting  PARTICIPATION LIMITATIONS: community activity and occupation  PERSONAL FACTORS: Time since onset of injury/illness/exacerbation and 1-2 comorbidities: HTN, OSA are also affecting patient's functional outcome.   REHAB POTENTIAL: Good  CLINICAL DECISION MAKING: Stable/uncomplicated  EVALUATION COMPLEXITY: Low   GOALS:   SHORT/LONG TERM GOALS: Target date: 09/16/2024  Pt will demonstrate appropriate understanding and performance of initially prescribed HEP in order to facilitate improved independence with management of symptoms.  Baseline: HEP established  Goal status: INITIAL   2. Pt will report full return to daily/work tasks without overt limitations or increase in pain.   Baseline: no limitations in usual ADLs, limited lifting and has not returned to work yet  Goal status: INITIAL    3. Pt will demonstrate 0-140 deg of R elbow flex/ext AROM in order to facilitate improved tolerance to daily tasks.  Baseline: see ROM chart above   Goal status: INITIAL    PLAN:  PT FREQUENCY: 1-2 visits PRN  PT  DURATION: 4 weeks  PLANNED INTERVENTIONS: [id number]- PT Re-evaluation, 97750- Physical Performance Testing, 97110-Therapeutic exercises, 97530- Therapeutic activity, [id number]- Neuromuscular re-education, 97535- Self Care, [id number]- Manual therapy, Patient/Family education, Taping, Joint mobilization, Cryotherapy, and Moist heat  PLAN FOR NEXT SESSION: follow up PRN.    Alm DELENA Jenny PT, DPT 08/19/2024 11:15 AM  "

## 2024-08-19 NOTE — Therapy (Deleted)
 " OUTPATIENT PHYSICAL THERAPY LOWER EXTREMITY EVALUATION   Patient Name: Micheal Mckinney MRN: [id number] DOB:08/05/1970, 54 y.o., male Today's Date: 08/19/2024  END OF SESSION:   Past Medical History:  Diagnosis Date   Allergy    hay fever   Foreign body (FB) in soft tissue    right palm   GERD (gastroesophageal reflux disease)    History of chicken pox    Hyperlipidemia    Hypertension    OSA (obstructive sleep apnea) 01/26/2012   does not use CPAP   Past Surgical History:  Procedure Laterality Date   ARTHROSCOPY, ELBOW WITH DEBRIDEMENT Right 08/17/2024   Procedure: ARTHROSCOPY, ELBOW WITH DEBRIDEMENT;  Surgeon: Genelle Standing, MD;  Location: [facility];  Service: Orthopedics;  Laterality: Right;  RIGHT ELBOW ARTHROSCOPY WITH extensor carpi radialis brevis DEBRIDEMENT   CARPAL TUNNEL RELEASE Bilateral 05/04/2013   CERVICAL FUSION     Alternative to this done in 05/2017, disc replacement   FOREIGN BODY REMOVAL Right 05/18/2018   Procedure: REMOVAL FOREIGN BODY RIGHT PALM;  Surgeon: Murrell Drivers, MD;  Location: [facility];  Service: Orthopedics;  Laterality: Right;   IRRIGATION AND DEBRIDEMENT SEBACEOUS CYST  2009    on back and neck   SHOULDER ARTHROSCOPY WITH ROTATOR CUFF REPAIR AND OPEN BICEPS TENODESIS Left 07/23/2022   Procedure: LEFT SHOULDER ARTHROSCOPY WITH ROTATOR CUFF REPAIR AND BICEPS TENODESIS;  Surgeon: Genelle Standing, MD;  Location: [facility];  Service: Orthopedics;  Laterality: Left;   Patient Active Problem List   Diagnosis Date Noted   Lateral epicondylitis, right elbow 08/17/2024   Palpitations 01/29/2024   Nontraumatic complete tear of left rotator cuff 07/23/2022   Biceps tendinitis of left upper extremity 07/23/2022   Visit for preventive health examination 10/31/2015   Acute drug-induced gout of right foot 08/15/2015   Hyperlipidemia 05/10/2013   Routine general medical examination at a health care facility  05/07/2013   Carpal tunnel syndrome 01/12/2013   HTN (hypertension) 01/26/2012   OSA (obstructive sleep apnea) 01/26/2012   Obesity 01/26/2012    PCP: ***  REFERRING PROVIDER: ***  REFERRING DIAG: ***  THERAPY DIAG:  No diagnosis found.  Rationale for Evaluation and Treatment: {HABREHAB:27488}  ONSET DATE: ***  SUBJECTIVE:   SUBJECTIVE STATEMENT: ***  PERTINENT HISTORY: *** PAIN:  Are you having pain? {OPRCPAIN:27236}  PRECAUTIONS: {Therapy precautions:24002}  RED FLAGS: {PT Red Flags:29287}   WEIGHT BEARING RESTRICTIONS: {Yes ***/No:24003}  FALLS:  Has patient fallen in last 6 months? {fallsyesno:27318}  LIVING ENVIRONMENT: Lives with: {[facility] lives with:25569::lives with their family} Lives in: {Lives in:25570} Stairs: {opstairs:27293} Has following equipment at home: {Assistive devices:23999}  OCCUPATION: ***  PLOF: {PLOF:24004}  PATIENT GOALS: ***  NEXT MD VISIT: ***  OBJECTIVE:  Note: Objective measures were completed at Evaluation unless otherwise noted.  DIAGNOSTIC FINDINGS: ***  PATIENT SURVEYS:  {rehab surveys:24030}  COGNITION: Overall cognitive status: {cognition:24006}     SENSATION: {sensation:27233}  EDEMA:  {edema:24020}  MUSCLE LENGTH: Hamstrings: Right *** deg; Left *** deg Debby test: Right *** deg; Left *** deg  POSTURE: {posture:25561}  PALPATION: ***  LOWER EXTREMITY ROM:  {AROM/PROM:27142} ROM Right eval Left eval  Hip flexion    Hip extension    Hip abduction    Hip adduction    Hip internal rotation    Hip external rotation    Knee flexion    Knee extension    Ankle dorsiflexion    Ankle plantarflexion    Ankle inversion  Ankle eversion     (Blank rows = not tested)  LOWER EXTREMITY MMT:  MMT Right eval Left eval  Hip flexion    Hip extension    Hip abduction    Hip adduction    Hip internal rotation    Hip external rotation    Knee flexion    Knee extension    Ankle dorsiflexion     Ankle plantarflexion    Ankle inversion    Ankle eversion     (Blank rows = not tested)  LOWER EXTREMITY SPECIAL TESTS:  {LEspecialtests:26242}  FUNCTIONAL TESTS:  {Functional tests:24029}  GAIT: Distance walked: *** Assistive device utilized: {Assistive devices:23999} Level of assistance: {Levels of assistance:24026} Comments: ***                                                                                                                                TREATMENT DATE: ***    PATIENT EDUCATION:  Education details: *** Person educated: {Person educated:25204} Education method: {Education Method:25205} Education comprehension: {Education Comprehension:25206}  HOME EXERCISE PROGRAM: ***  ASSESSMENT:  CLINICAL IMPRESSION: Patient is a *** y.o. *** who was seen today for physical therapy evaluation and treatment for ***.   OBJECTIVE IMPAIRMENTS: {opptimpairments:25111}.   ACTIVITY LIMITATIONS: {activitylimitations:27494}  PARTICIPATION LIMITATIONS: {participationrestrictions:25113}  PERSONAL FACTORS: {Personal factors:25162} are also affecting patient's functional outcome.   REHAB POTENTIAL: {rehabpotential:25112}  CLINICAL DECISION MAKING: {clinical decision making:25114}  EVALUATION COMPLEXITY: {Evaluation complexity:25115}   GOALS: Goals reviewed with patient? {yes/no:20286}  SHORT TERM GOALS: Target date: *** *** Baseline: Goal status: INITIAL  2.  *** Baseline:  Goal status: INITIAL  3.  *** Baseline:  Goal status: INITIAL  4.  *** Baseline:  Goal status: INITIAL  5.  *** Baseline:  Goal status: INITIAL  6.  *** Baseline:  Goal status: INITIAL  LONG TERM GOALS: Target date: ***  *** Baseline:  Goal status: INITIAL  2.  *** Baseline:  Goal status: INITIAL  3.  *** Baseline:  Goal status: INITIAL  4.  *** Baseline:  Goal status: INITIAL  5.  *** Baseline:  Goal status: INITIAL  6.  *** Baseline:  Goal status:  INITIAL   PLAN:  PT FREQUENCY: {rehab frequency:25116}  PT DURATION: {rehab duration:25117}  PLANNED INTERVENTIONS: {rehab planned interventions:25118::97110-Therapeutic exercises,97530- Therapeutic [phone]- Neuromuscular re-education,97535- Self Rjmz,02859- Manual therapy,Patient/Family education}  PLAN FOR NEXT SESSION: ***   Pamela Intrieri, PT 08/19/2024, 9:18 AM  "

## 2024-08-20 ENCOUNTER — Encounter: Admitting: Family Medicine

## 2024-08-25 ENCOUNTER — Ambulatory Visit (HOSPITAL_BASED_OUTPATIENT_CLINIC_OR_DEPARTMENT_OTHER): Admitting: Orthopaedic Surgery

## 2024-09-01 ENCOUNTER — Encounter (HOSPITAL_BASED_OUTPATIENT_CLINIC_OR_DEPARTMENT_OTHER): Admitting: Orthopaedic Surgery

## 2025-02-04 ENCOUNTER — Encounter: Admitting: Family Medicine
